# Patient Record
Sex: Female | Born: 1970 | Race: White | Hispanic: No | Marital: Married | Smoking: Former smoker
Health system: Southern US, Community
[De-identification: ages and names within clinical notes are randomized; demographics above are authoritative.]

## PROBLEM LIST (undated history)

## (undated) DIAGNOSIS — K432 Incisional hernia without obstruction or gangrene: Secondary | ICD-10-CM

## (undated) DIAGNOSIS — J40 Bronchitis, not specified as acute or chronic: Secondary | ICD-10-CM

## (undated) DIAGNOSIS — L439 Lichen planus, unspecified: Secondary | ICD-10-CM

## (undated) DIAGNOSIS — M7732 Calcaneal spur, left foot: Secondary | ICD-10-CM

## (undated) DIAGNOSIS — E119 Type 2 diabetes mellitus without complications: Secondary | ICD-10-CM

## (undated) DIAGNOSIS — G4733 Obstructive sleep apnea (adult) (pediatric): Secondary | ICD-10-CM

## (undated) DIAGNOSIS — F419 Anxiety disorder, unspecified: Secondary | ICD-10-CM

## (undated) DIAGNOSIS — J189 Pneumonia, unspecified organism: Secondary | ICD-10-CM

## (undated) HISTORY — DX: Obstructive sleep apnea (adult) (pediatric): G47.33

## (undated) HISTORY — PX: OTHER SURGICAL HISTORY: SHX169

---

## 1998-10-05 ENCOUNTER — Emergency Department (HOSPITAL_COMMUNITY): Admission: EM | Admit: 1998-10-05 | Discharge: 1998-10-05 | Payer: Self-pay | Admitting: Emergency Medicine

## 2003-05-22 ENCOUNTER — Emergency Department (HOSPITAL_COMMUNITY): Admission: EM | Admit: 2003-05-22 | Discharge: 2003-05-23 | Payer: Self-pay | Admitting: *Deleted

## 2003-05-23 ENCOUNTER — Encounter: Payer: Self-pay | Admitting: *Deleted

## 2005-02-13 ENCOUNTER — Emergency Department (HOSPITAL_COMMUNITY): Admission: EM | Admit: 2005-02-13 | Discharge: 2005-02-13 | Payer: Self-pay | Admitting: Emergency Medicine

## 2005-02-13 IMAGING — CR DG CHEST 2V
2 series · 2 of 2 positions shown · non-contrast
Comparison: none

CLINICAL DATA: Cough.  Fever.  Shortness of breath.  
 CHEST - 2 VIEWS:

[view not recorded (1 of 2)]
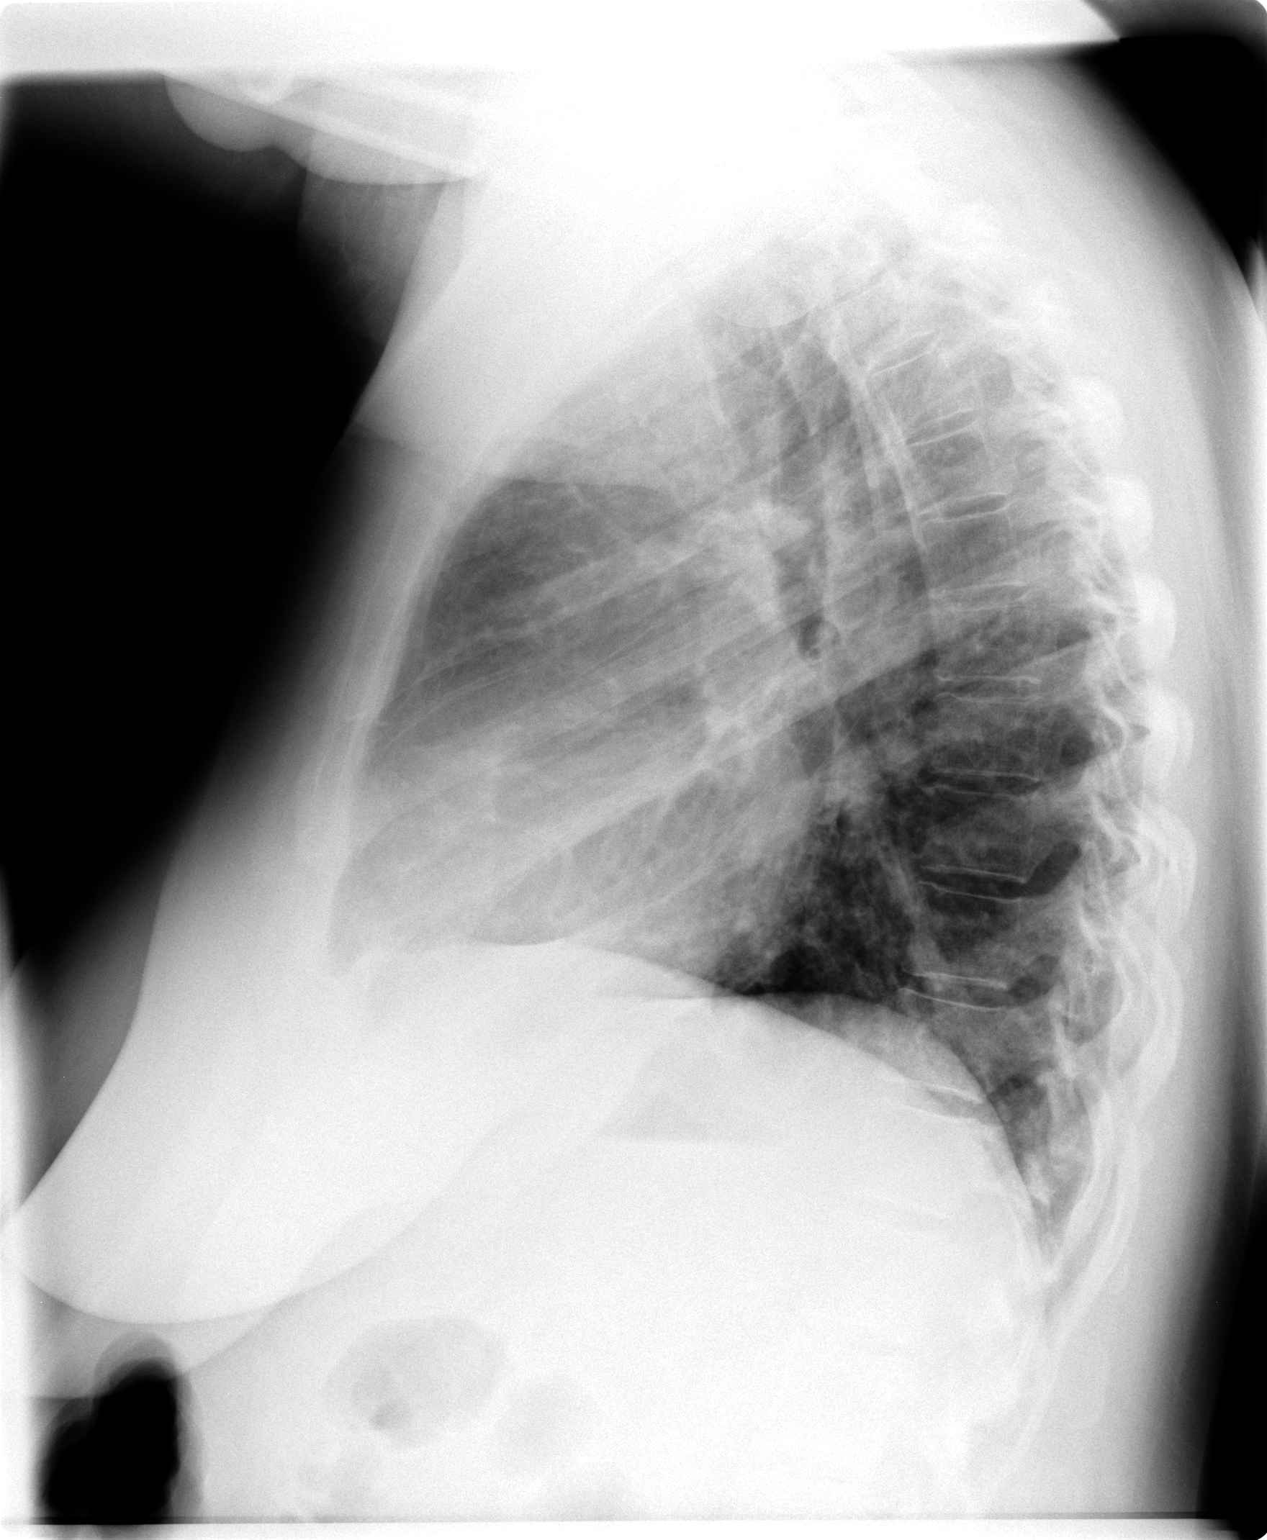

[view not recorded (2 of 2)]
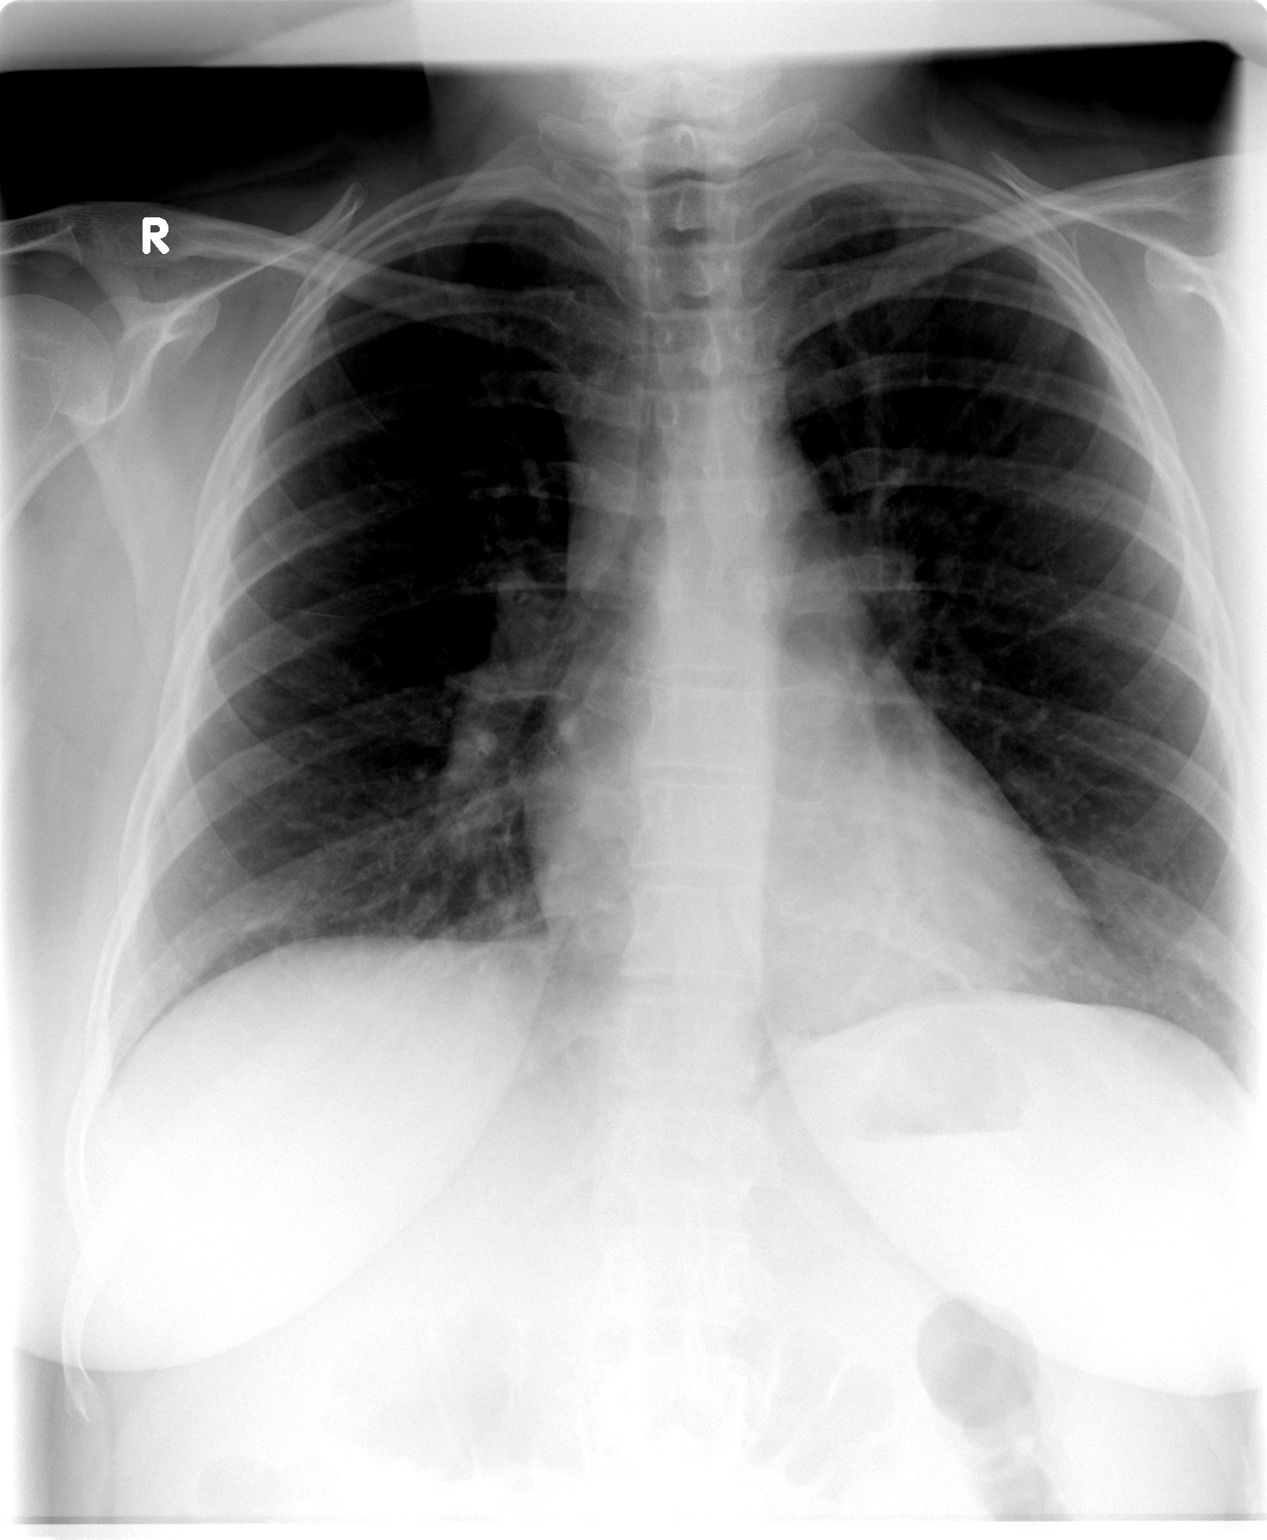

[2 of 2 positions shown; findings below may reference images not displayed]

FINDINGS: No acute consolidation, pneumonia, effusion or pneumothorax.  Heart size is normal.
IMPRESSION: No acute chest disease.

## 2008-12-27 ENCOUNTER — Emergency Department (HOSPITAL_COMMUNITY): Admission: EM | Admit: 2008-12-27 | Discharge: 2008-12-27 | Payer: Self-pay | Admitting: Emergency Medicine

## 2011-03-29 LAB — URINALYSIS, ROUTINE W REFLEX MICROSCOPIC
Glucose, UA: NEGATIVE mg/dL
Hgb urine dipstick: NEGATIVE
Ketones, ur: NEGATIVE mg/dL
Protein, ur: NEGATIVE mg/dL
pH: 7 (ref 5.0–8.0)

## 2012-07-01 ENCOUNTER — Emergency Department (HOSPITAL_COMMUNITY)
Admission: EM | Admit: 2012-07-01 | Discharge: 2012-07-01 | Disposition: A | Discharge status: Home / Self Care | Payer: Self-pay | Attending: Emergency Medicine | Admitting: Emergency Medicine

## 2012-07-01 ENCOUNTER — Encounter (HOSPITAL_COMMUNITY): Payer: Self-pay | Admitting: *Deleted

## 2012-07-01 DIAGNOSIS — J069 Acute upper respiratory infection, unspecified: Secondary | ICD-10-CM

## 2012-07-01 DIAGNOSIS — J029 Acute pharyngitis, unspecified: Secondary | ICD-10-CM

## 2012-07-01 DIAGNOSIS — R070 Pain in throat: Secondary | ICD-10-CM | POA: Insufficient documentation

## 2012-07-01 DIAGNOSIS — H9209 Otalgia, unspecified ear: Secondary | ICD-10-CM | POA: Insufficient documentation

## 2012-07-01 DIAGNOSIS — Z87891 Personal history of nicotine dependence: Secondary | ICD-10-CM | POA: Insufficient documentation

## 2012-07-01 HISTORY — DX: Bronchitis, not specified as acute or chronic: J40

## 2012-07-01 LAB — RAPID STREP SCREEN (MED CTR MEBANE ONLY): Streptococcus, Group A Screen (Direct): NEGATIVE

## 2012-07-01 MED ORDER — HYDROCODONE-ACETAMINOPHEN 5-325 MG PO TABS: ORAL_TABLET | ORAL | Status: AC

## 2012-07-01 MED ORDER — NAPROXEN 250 MG PO TABS: 250.0000 mg | ORAL_TABLET | Freq: Two times a day (BID) | ORAL | Status: DC

## 2012-07-01 MED ORDER — IBUPROFEN 400 MG PO TABS
400.0000 mg | ORAL_TABLET | Freq: Once | ORAL | Status: AC
  Administered 2012-07-01: 400 mg via ORAL
  Filled 2012-07-01: qty 1

## 2012-07-01 MED ORDER — DEXAMETHASONE 4 MG PO TABS
4.0000 mg | ORAL_TABLET | Freq: Once | ORAL | Status: AC
  Administered 2012-07-01: 4 mg via ORAL
  Filled 2012-07-01: qty 1

## 2012-07-01 NOTE — ED Notes (Signed)
Rt ear pain, throat scratchy/aches x 1 day

## 2012-07-01 NOTE — ED Provider Notes (Signed)
History     CSN: [id number]  Arrival date & time 07/01/12  0610   First MD Initiated Contact with Patient 07/01/12 [phone]      Chief Complaint  Patient presents with  . Sore Throat  . Otalgia    HPI Pt was seen at 0725.  Per pt, c/o gradual onset and persistence of constant sore throat, stuffy nose, sinus congestion, and right ear pain for the past 1 day.  Denies fevers, no rash, no CP/SOB, no N/V/D, no abd pain.    Past Medical History  Diagnosis Date  . Asthma   . Bronchitis     History reviewed. No pertinent past surgical history.    History  Substance Use Topics  . Smoking status: Former Games developer  . Smokeless tobacco: Not on file  . Alcohol Use: No    Review of Systems ROS: Statement: All systems negative except as marked or noted in the HPI; Constitutional: Negative for fever and chills. ; ; Eyes: Negative for eye pain, redness and discharge. ; ; ENMT: Negative for hoarseness, +ear pain, nasal congestion, sinus pressure and sore throat. ; ; Cardiovascular: Negative for chest pain, palpitations, diaphoresis, dyspnea and peripheral edema. ; ; Respiratory: Negative for cough, wheezing and stridor. ; ; Gastrointestinal: Negative for nausea, vomiting, diarrhea, abdominal pain, blood in stool, hematemesis, jaundice and rectal bleeding. . ; ; Genitourinary: Negative for dysuria, flank pain and hematuria. ; ; Musculoskeletal: Negative for back pain and neck pain. Negative for swelling and trauma.; ; Skin: Negative for pruritus, rash, abrasions, blisters, bruising and skin lesion.; ; Neuro: Negative for headache, lightheadedness and neck stiffness. Negative for weakness, altered level of consciousness , altered mental status, extremity weakness, paresthesias, involuntary movement, seizure and syncope.       Allergies  Penicillins  Home Medications  No current outpatient prescriptions on file.  BP 137/82  Pulse 78  Temp 98 F (36.7 C) (Oral)  Resp 18  Ht 5\' 2"  (1.575 m)   Wt 203 lb (92.08 kg)  BMI 37.13 kg/m2  SpO2 96%  LMP 06/15/2012  Physical Exam 0730: Physical examination:  Nursing notes reviewed; Vital signs and O2 SAT reviewed;  Constitutional: Well developed, Well nourished, Well hydrated, In no acute distress; Head:  Normocephalic, atraumatic; Eyes: EOMI, PERRL, No scleral icterus; ENMT: TM's clear bilat, +edemetous nasal turbinates bilat with clear rhinorrhea. +post pharynx with erythema, no exudates, no intra-oral edema, no hoarse voice, no drooling, no stridor. Mouth and pharynx normal, Mucous membranes moist; Neck: Supple, Full range of motion, No lymphadenopathy; Cardiovascular: Regular rate and rhythm, No murmur, rub, or gallop; Respiratory: Breath sounds clear & equal bilaterally, No rales, rhonchi, wheezes.  Speaking full sentences with ease, Normal respiratory effort/excursion; Chest: Nontender, Movement normal; Extremities: Pulses normal, No tenderness, No edema, No calf edema or asymmetry.; Neuro: AA&Ox3, Major CN grossly intact.  Speech clear. No gross focal motor or sensory deficits in extremities.; Skin: Color normal, Warm, Dry, no rash.   ED Course  Procedures   MDM  MDM Reviewed: nursing note and vitals Interpretation: labs     Results for orders placed during the hospital encounter of 07/01/12  RAPID STREP SCREEN      Component Value Range   Streptococcus, Group A Screen (Direct) NEGATIVE  NEGATIVE     8:38 AM:  Appears viral URI at this time, will tx symptomatically.  Dx testing d/w pt.  Questions answered.  Verb understanding, agreeable to d/c home with outpt f/u.  Laray Anger, DO 07/03/12 [phone]

## 2012-12-19 ENCOUNTER — Emergency Department (HOSPITAL_COMMUNITY): Payer: Self-pay

## 2012-12-19 ENCOUNTER — Encounter (HOSPITAL_COMMUNITY): Payer: Self-pay

## 2012-12-19 ENCOUNTER — Emergency Department (HOSPITAL_COMMUNITY)
Admission: EM | Admit: 2012-12-19 | Discharge: 2012-12-19 | Disposition: A | Discharge status: Home / Self Care | Payer: Self-pay | Attending: Emergency Medicine | Admitting: Emergency Medicine

## 2012-12-19 DIAGNOSIS — R509 Fever, unspecified: Secondary | ICD-10-CM | POA: Insufficient documentation

## 2012-12-19 DIAGNOSIS — Z87891 Personal history of nicotine dependence: Secondary | ICD-10-CM | POA: Insufficient documentation

## 2012-12-19 DIAGNOSIS — R079 Chest pain, unspecified: Secondary | ICD-10-CM | POA: Insufficient documentation

## 2012-12-19 DIAGNOSIS — Z79899 Other long term (current) drug therapy: Secondary | ICD-10-CM | POA: Insufficient documentation

## 2012-12-19 DIAGNOSIS — J45901 Unspecified asthma with (acute) exacerbation: Secondary | ICD-10-CM | POA: Insufficient documentation

## 2012-12-19 DIAGNOSIS — J189 Pneumonia, unspecified organism: Secondary | ICD-10-CM

## 2012-12-19 IMAGING — CR DG CHEST 2V
2 series · 2 of 2 positions shown · non-contrast
Comparison: PA and lateral chest [DATE].

CLINICAL DATA: Left side chest pain.  Cough and fever.

CHEST - 2 VIEW

[view not recorded (1 of 2)]
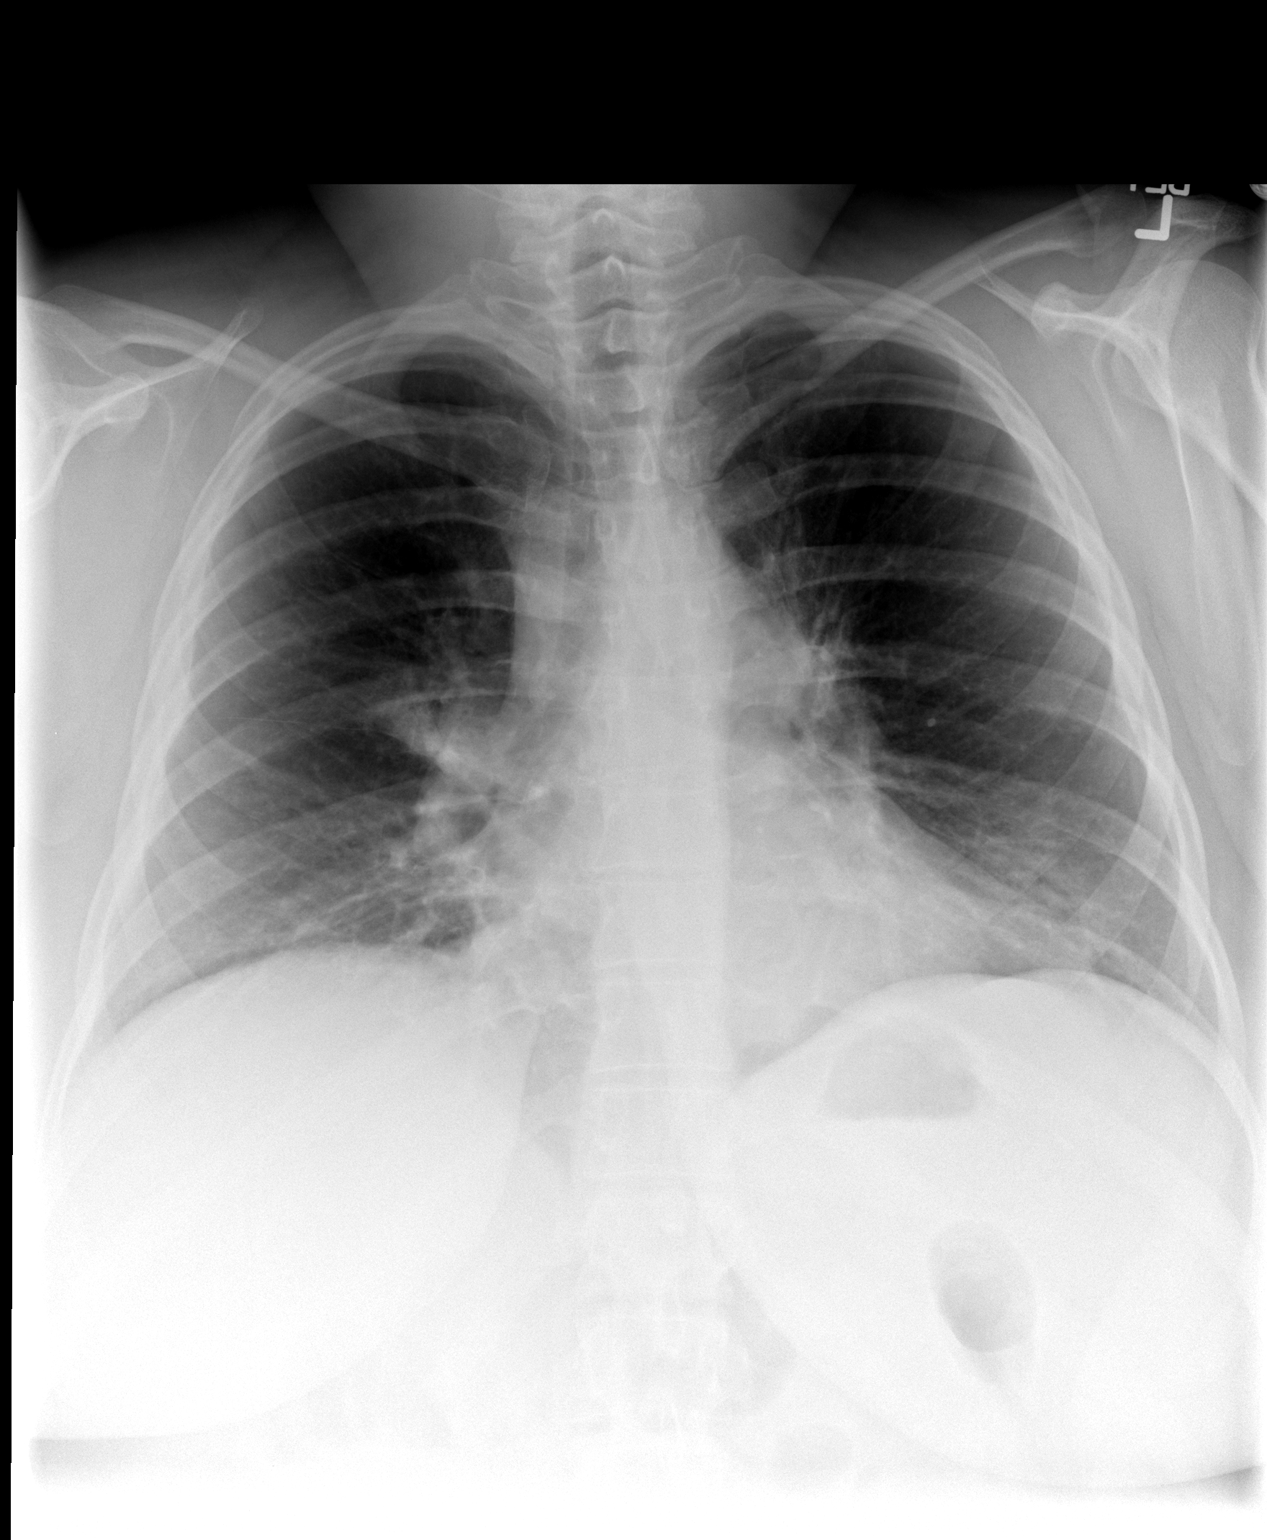

[view not recorded (2 of 2)]
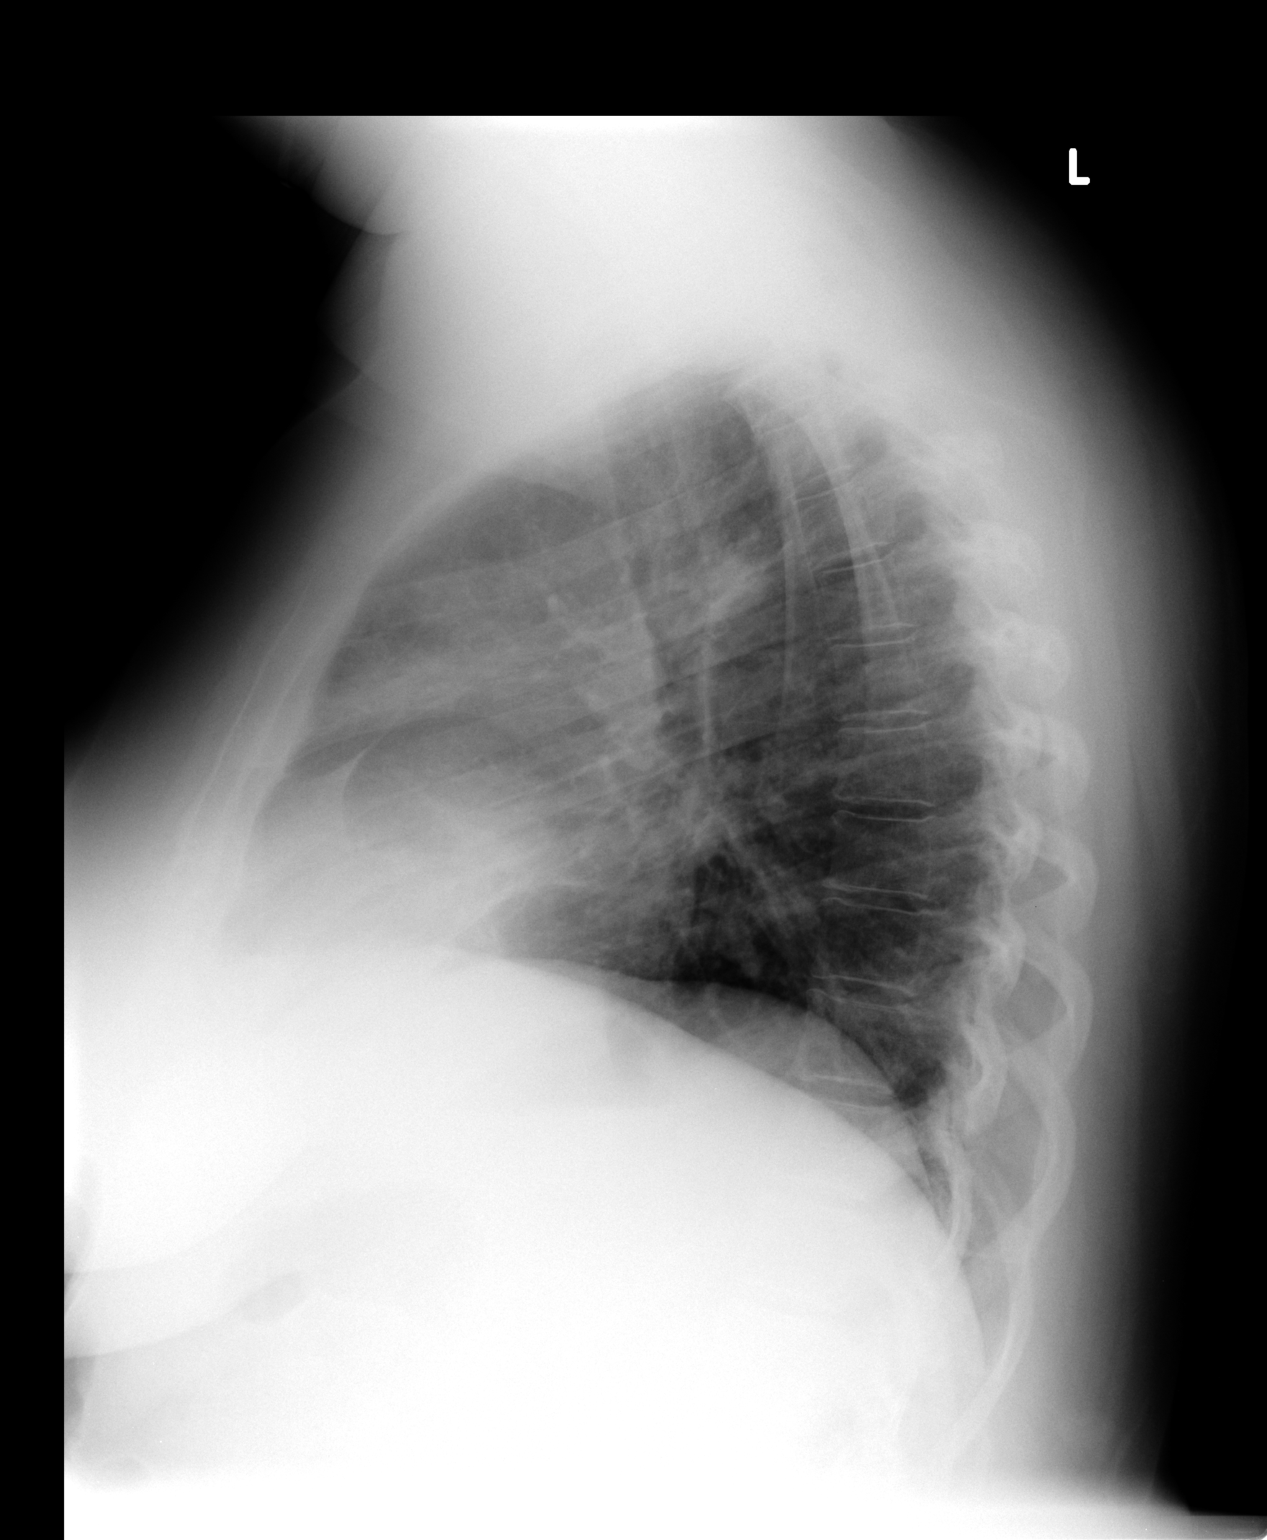

[2 of 2 positions shown; findings below may reference images not displayed]

FINDINGS: There are bilateral perihilar airspace opacities.
Streaky basilar opacities are also noted.  No pneumothorax or
pleural fluid.  Heart size is normal.
IMPRESSION: Perihilar and basilar airspace disease worrisome for pneumonia.

## 2012-12-19 MED ORDER — AZITHROMYCIN 250 MG PO TABS: ORAL_TABLET | ORAL | Status: DC

## 2012-12-19 MED ORDER — AZITHROMYCIN 250 MG PO TABS
500.0000 mg | ORAL_TABLET | Freq: Once | ORAL | Status: AC
  Administered 2012-12-19: 500 mg via ORAL
  Filled 2012-12-19: qty 2

## 2012-12-19 MED ORDER — CEFTRIAXONE SODIUM 1 G IJ SOLR
1.0000 g | Freq: Once | INTRAMUSCULAR | Status: AC
  Administered 2012-12-19: 1 g via INTRAMUSCULAR
  Filled 2012-12-19: qty 10

## 2012-12-19 MED ORDER — GUAIFENESIN-CODEINE 100-10 MG/5ML PO SYRP: ORAL_SOLUTION | ORAL | Status: DC

## 2012-12-19 MED ORDER — HYDROCOD POLST-CHLORPHEN POLST 10-8 MG/5ML PO LQCR
5.0000 mL | Freq: Once | ORAL | Status: AC
  Administered 2012-12-19: 5 mL via ORAL
  Filled 2012-12-19: qty 5

## 2012-12-19 NOTE — ED Provider Notes (Signed)
Medical screening examination/treatment/procedure(s) were performed by non-physician practitioner and as supervising physician I was immediately available for consultation/collaboration.   Celene Kras, MD 12/19/12 [phone]

## 2012-12-19 NOTE — ED Notes (Signed)
Coughing, chest congestion per patient. Was coughing tonight and started hurting in my left side per pt.

## 2012-12-19 NOTE — ED Provider Notes (Signed)
History     CSN: [id number]  Arrival date & time 12/19/12  2143   First MD Initiated Contact with Patient 12/19/12 2150      Chief Complaint  Patient presents with  . Cough  . Shortness of Breath    (Consider location/radiation/quality/duration/timing/severity/associated sxs/prior treatment) HPI Comments: Coughing x 2 days.  + subj fever.  After one forceful cough this PM she  Has had sharp pain under the R breast.  Pt of dr. Babette Relic spears.  The history is provided by the patient. No language interpreter was used.    Past Medical History  Diagnosis Date  . Asthma   . Bronchitis     History reviewed. No pertinent past surgical history.  No family history on file.  History  Substance Use Topics  . Smoking status: Former Games developer  . Smokeless tobacco: Not on file  . Alcohol Use: No    OB History    Grav Para Term Preterm Abortions TAB SAB Ect Mult Living                  Review of Systems  Constitutional: Positive for fever. Negative for chills.  Respiratory: Positive for cough and shortness of breath.   Cardiovascular: Positive for chest pain.  All other systems reviewed and are negative.    Allergies  Penicillins  Home Medications   Current Outpatient Rx  Name  Route  Sig  Dispense  Refill  . AZITHROMYCIN 250 MG PO TABS      One tab po QD (intial dose given in ED)   4 tablet   0   . GUAIFENESIN-CODEINE 100-10 MG/5ML PO SYRP      10 mls po q 4-6 hrs prn cough   240 mL   0   . NAPROXEN 250 MG PO TABS   Oral   Take 1 tablet (250 mg total) by mouth 2 (two) times daily with a meal.   14 tablet   0     BP 129/84  Pulse 88  Temp 98.2 F (36.8 C) (Oral)  Resp 18  Ht 5\' 1"  (1.549 m)  Wt 260 lb (117.935 kg)  BMI 49.13 kg/m2  SpO2 100%  LMP 12/14/2012  Physical Exam  Nursing note and vitals reviewed. Constitutional: She is oriented to person, place, and time. She appears well-developed and well-nourished. No distress.  HENT:  Head:  Normocephalic and atraumatic.  Eyes: EOM are normal.  Neck: Normal range of motion.  Cardiovascular: Normal rate and regular rhythm.   Pulmonary/Chest: Effort normal and breath sounds normal. No respiratory distress. She has no wheezes. She has no rales. She exhibits no tenderness.    Abdominal: Soft. She exhibits no distension. There is no tenderness.  Musculoskeletal: Normal range of motion. She exhibits tenderness.  Neurological: She is alert and oriented to person, place, and time.  Skin: Skin is warm and dry. She is not diaphoretic.  Psychiatric: She has a normal mood and affect. Judgment normal.    ED Course  Procedures (including critical care time)  Labs Reviewed - No data to display Dg Chest 2 View  12/19/2012  *RADIOLOGY REPORT*  Clinical Data: Left side chest pain.  Cough and fever.  CHEST - 2 VIEW  Comparison: PA and lateral chest 02/13/2005.  Findings: There are bilateral perihilar airspace opacities. Streaky basilar opacities are also noted.  No pneumothorax or pleural fluid.  Heart size is normal.  IMPRESSION: Perihilar and basilar airspace disease worrisome for pneumonia.   Original  Report Authenticated By: Holley Dexter, M.D.      1. Community acquired pneumonia       MDM  Rocephin 1 gm IM zithromax 500 mg po rx-zithromax 250 mg x 4 days rx-robitussin AC, 240 ml        Evalina Field, [place] 12/19/12 2313

## 2012-12-19 NOTE — ED Notes (Signed)
Upon returning from xray pt states cannot breath, and feels SOB. No difficulty speaking in full sentences at this time. No acute distress noted. EDP notified.

## 2012-12-19 NOTE — ED Notes (Signed)
Pt presents with c/o SOB, cough and generalized weakness x 2 days. Pt denies sore throat, fever, n/v/d and chest pain. Pt reports taking OTC cold medication without relief. NAD noted vitals WNL .

## 2012-12-19 NOTE — ED Notes (Signed)
Advised patient to wait 15 minutes after medication administration before departing ED. Patient verbalized understanding. Patient currently sitting up in bed. No obvious distress noted.

## 2013-01-04 ENCOUNTER — Encounter (HOSPITAL_COMMUNITY): Payer: Self-pay | Admitting: Emergency Medicine

## 2013-01-04 ENCOUNTER — Emergency Department (HOSPITAL_COMMUNITY)
Admission: EM | Admit: 2013-01-04 | Discharge: 2013-01-05 | Disposition: A | Discharge status: Home / Self Care | Payer: Self-pay | Attending: Emergency Medicine | Admitting: Emergency Medicine

## 2013-01-04 ENCOUNTER — Emergency Department (HOSPITAL_COMMUNITY): Payer: Self-pay

## 2013-01-04 DIAGNOSIS — R109 Unspecified abdominal pain: Secondary | ICD-10-CM | POA: Insufficient documentation

## 2013-01-04 DIAGNOSIS — Z79899 Other long term (current) drug therapy: Secondary | ICD-10-CM | POA: Insufficient documentation

## 2013-01-04 DIAGNOSIS — J159 Unspecified bacterial pneumonia: Secondary | ICD-10-CM | POA: Insufficient documentation

## 2013-01-04 DIAGNOSIS — J189 Pneumonia, unspecified organism: Secondary | ICD-10-CM

## 2013-01-04 DIAGNOSIS — J45909 Unspecified asthma, uncomplicated: Secondary | ICD-10-CM | POA: Insufficient documentation

## 2013-01-04 DIAGNOSIS — Z8709 Personal history of other diseases of the respiratory system: Secondary | ICD-10-CM | POA: Insufficient documentation

## 2013-01-04 DIAGNOSIS — Z87891 Personal history of nicotine dependence: Secondary | ICD-10-CM | POA: Insufficient documentation

## 2013-01-04 HISTORY — DX: Pneumonia, unspecified organism: J18.9

## 2013-01-04 IMAGING — CR DG CHEST 2V
2 series · 2 of 2 positions shown · non-contrast
Comparison: Chest radiograph performed [DATE]

CLINICAL DATA: Chest pain and bilateral flank pain; history of
asthma and smoking..

CHEST - 2 VIEW

[view not recorded (1 of 2)]
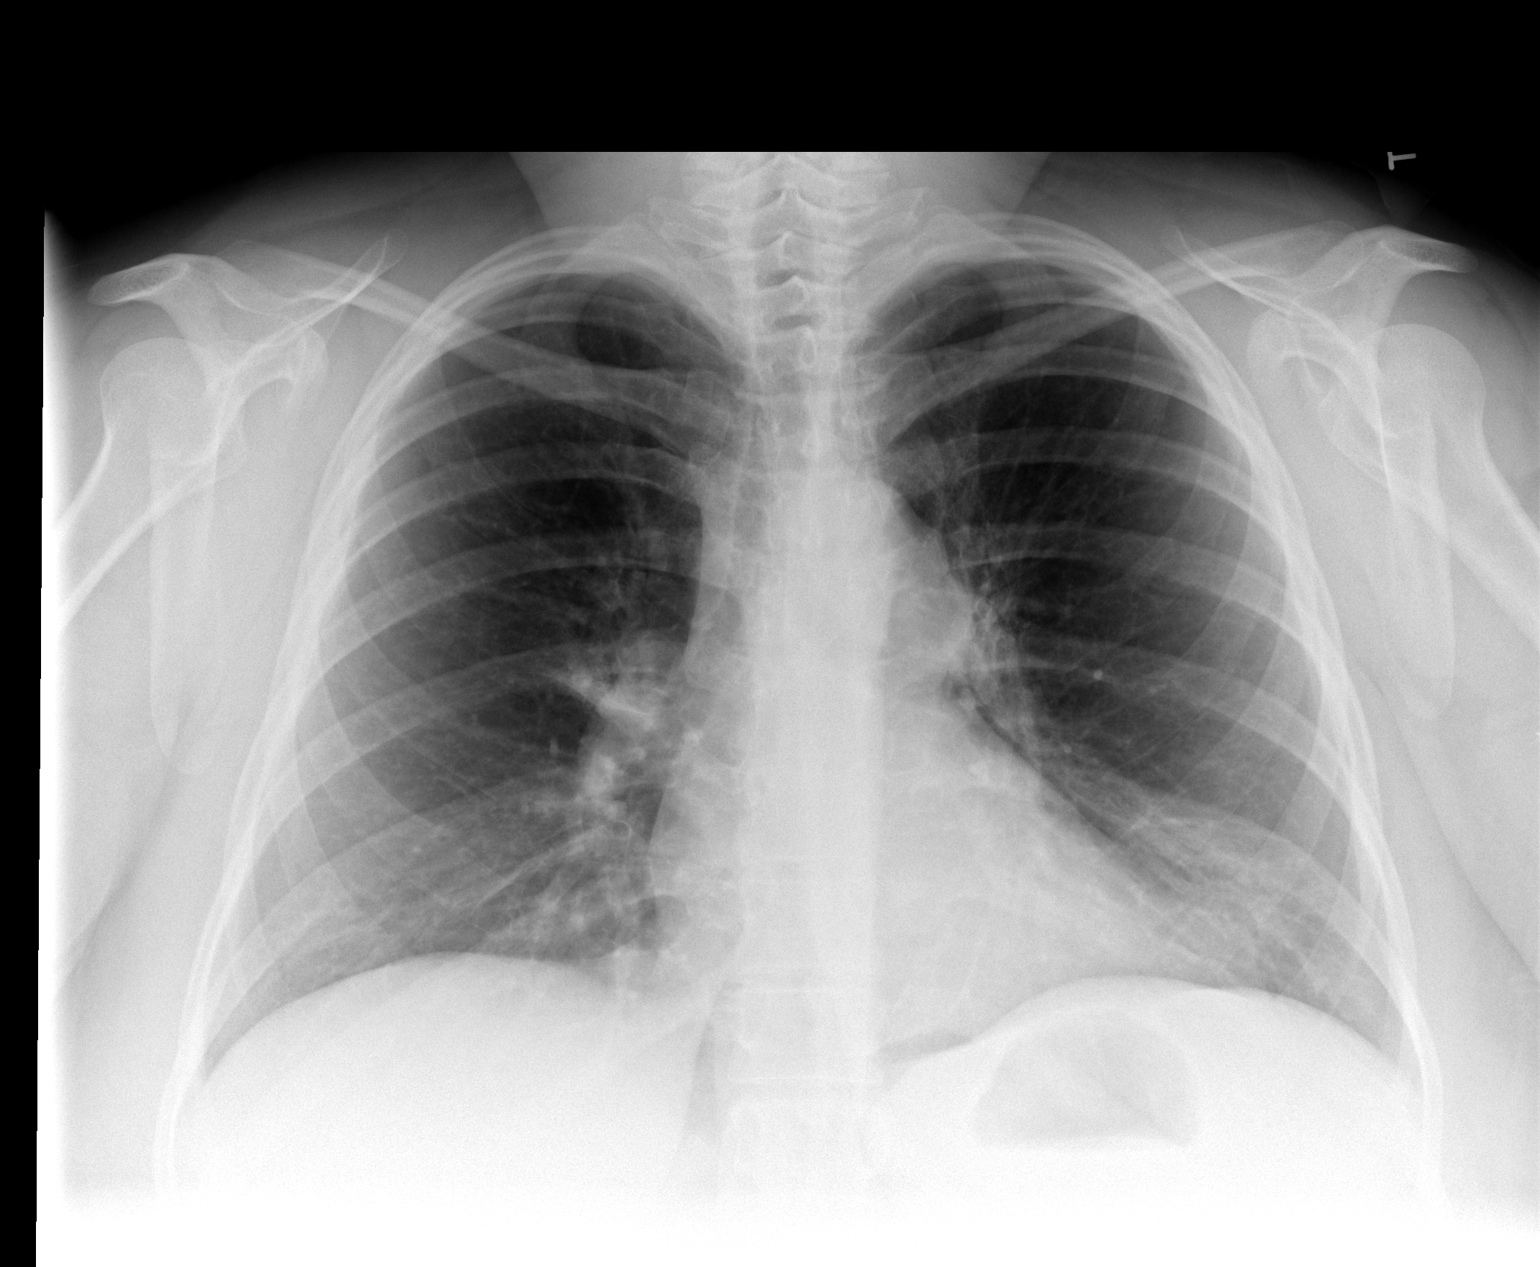

[view not recorded (2 of 2)]
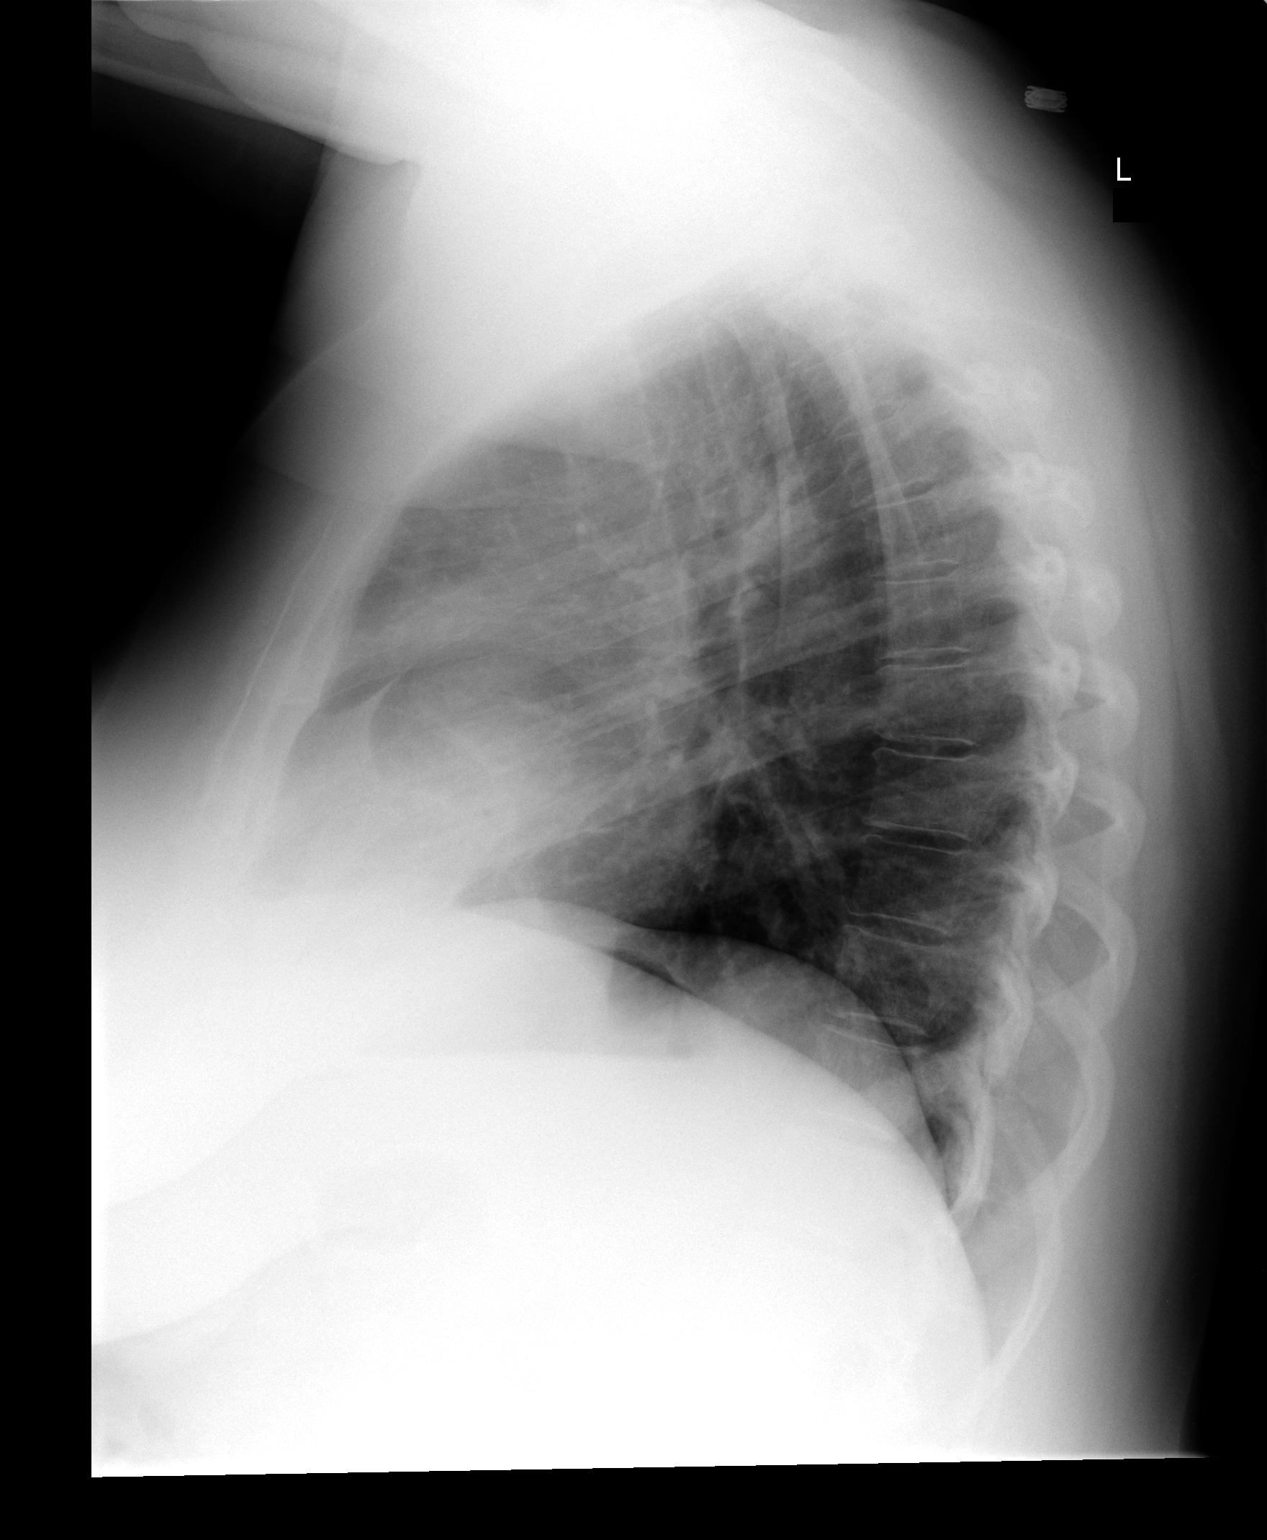

[2 of 2 positions shown; findings below may reference images not displayed]

FINDINGS: There is persistent lingular airspace opacification,
concerning for persistent pneumonia.  No pleural effusion or
pneumothorax is seen.  The lungs are relatively well expanded.

The heart is normal in size.  No acute osseous abnormalities are
identified.
IMPRESSION: Persistent lingular pneumonia noted.

## 2013-01-04 MED ORDER — IBUPROFEN 800 MG PO TABS
800.0000 mg | ORAL_TABLET | Freq: Once | ORAL | Status: AC
  Administered 2013-01-04: 800 mg via ORAL
  Filled 2013-01-04: qty 1

## 2013-01-04 MED ORDER — HYDROCODONE-ACETAMINOPHEN 5-325 MG PO TABS
1.0000 | ORAL_TABLET | Freq: Once | ORAL | Status: AC
  Administered 2013-01-04: 1 via ORAL
  Filled 2013-01-04: qty 1

## 2013-01-04 MED ORDER — OXYMETAZOLINE HCL 0.05 % NA SOLN
1.0000 | Freq: Once | NASAL | Status: AC
  Administered 2013-01-04: 1 via NASAL
  Filled 2013-01-04: qty 15

## 2013-01-04 NOTE — ED Notes (Signed)
Pt given water to drink per nurse

## 2013-01-04 NOTE — ED Notes (Signed)
Pt c/o bilateral intermittent flank pain. Pt was recently diagnosed with bilateral pneumonia.

## 2013-01-04 NOTE — ED Provider Notes (Signed)
History   This chart was scribed for [employer]. Colon Branch, MD by Sofie Rower, ED Scribe. The patient was seen in room [facility] and the patient's care was started at 11:09PM.     CSN: [id number]  Arrival date & time 01/04/13  2156   First MD Initiated Contact with Patient 01/04/13 2309      Chief Complaint  Patient presents with  . Flank Pain    (Consider location/radiation/quality/duration/timing/severity/associated sxs/prior treatment) The history is provided by the patient. No language interpreter was used.    Denise Barnett is a 42 y.o. female , with a hx of flank pain, bilateral pneumonia and asthma, who presents to the Emergency Department complaining of  progressively worsening, abdominal pain located at the bilateral, lateral abdominal regions.  The pt reports she has been experiencing a constant bilateral, lateral abdominal pain throughout the day today, however, her abdominal pain has suddenly become worse upon arrival and evaluation at [facility] this evening. The pt has not taken any medications to reliever her abdominal pain at present. Modifying factors include certain movements and positions which intensifies the abdominal pain.  The pt denies vomiting, hematochezia, and fever.   The pt does not smoke or drink alcohol.      Past Medical History  Diagnosis Date  . Asthma   . Bronchitis   . Pneumonia     History reviewed. No pertinent past surgical history.  History reviewed. No pertinent family history.  History  Substance Use Topics  . Smoking status: Former Games developer  . Smokeless tobacco: Not on file  . Alcohol Use: No    OB History    Grav Para Term Preterm Abortions TAB SAB Ect Mult Living                  Review of Systems  Constitutional: Negative for fever.  HENT: Positive for congestion.   Gastrointestinal: Positive for abdominal pain. Negative for vomiting and blood in stool.  All other systems reviewed and are negative.  A 10 review of systems  reviewed and are negative for acute change except as noted in the HPI.  Allergies  Penicillins  Home Medications   Current Outpatient Rx  Name  Route  Sig  Dispense  Refill  . ALBUTEROL SULFATE HFA 108 (90 BASE) MCG/ACT IN AERS   Inhalation   Inhale 2 puffs into the lungs every 6 (six) hours as needed. For shortness of breath         . IBUPROFEN 200 MG PO TABS   Oral   Take 400 mg by mouth daily as needed. For pain         . OXYMETAZOLINE HCL 0.05 % NA SOLN   Nasal   Place 2 sprays into the nose daily as needed. For nasal congestion           BP 133/79  Pulse 90  Temp 97.3 F (36.3 C) (Oral)  Resp 22  Ht 5\' 1"  (1.549 m)  Wt 230 lb (104.327 kg)  BMI 43.46 kg/m2  SpO2 100%  LMP 12/14/2012  Physical Exam  Nursing note and vitals reviewed. Constitutional: She is oriented to person, place, and time. She appears well-developed and well-nourished. No distress.  HENT:  Head: Normocephalic and atraumatic.  Eyes: EOM are normal. Pupils are equal, round, and reactive to light.  Neck: Neck supple. No tracheal deviation present.  Cardiovascular: Normal rate, regular rhythm and normal heart sounds.   Pulmonary/Chest: Effort normal and breath sounds normal. No respiratory  distress. She has no wheezes.  Abdominal: Soft. Bowel sounds are normal. She exhibits no distension.       Bilateral, lateral abdominal pain upon exam.   Musculoskeletal: Normal range of motion. She exhibits no edema.  Neurological: She is alert and oriented to person, place, and time. No sensory deficit.  Skin: Skin is warm and dry.  Psychiatric: She has a normal mood and affect. Her behavior is normal.    ED Course  Procedures (including critical care time)  Results for orders placed during the hospital encounter of 01/04/13  CBC WITH DIFFERENTIAL      Component Value Range   WBC 13.3 (*) 4.0 - 10.5 K/uL   RBC 4.34  3.87 - 5.11 MIL/uL   Hemoglobin 12.3  12.0 - 15.0 g/dL   HCT 16.1 (*) 09.6 - 04.5  %   MCV 80.4  78.0 - 100.0 fL   MCH 28.3  26.0 - 34.0 pg   MCHC 35.2  30.0 - 36.0 g/dL   RDW 40.9  81.1 - 91.4 %   Platelets 315  150 - 400 K/uL   Neutrophils Relative 68  43 - 77 %   Neutro Abs 9.0 (*) 1.7 - 7.7 K/uL   Lymphocytes Relative 18  12 - 46 %   Lymphs Abs 2.4  0.7 - 4.0 K/uL   Monocytes Relative 7  3 - 12 %   Monocytes Absolute 1.0  0.1 - 1.0 K/uL   Eosinophils Relative 7 (*) 0 - 5 %   Eosinophils Absolute 0.9 (*) 0.0 - 0.7 K/uL   Basophils Relative 0  0 - 1 %   Basophils Absolute 0.1  0.0 - 0.1 K/uL  BASIC METABOLIC PANEL      Component Value Range   Sodium 130 (*) 135 - 145 mEq/L   Potassium 3.6  3.5 - 5.1 mEq/L   Chloride 96  96 - 112 mEq/L   CO2 22  19 - 32 mEq/L   Glucose, Bld 161 (*) 70 - 99 mg/dL   BUN 11  6 - 23 mg/dL   Creatinine, Ser 7.82  0.50 - 1.10 mg/dL   Calcium 9.3  8.4 - 95.6 mg/dL   GFR calc non Af Amer >90  >90 mL/min   GFR calc Af Amer >90  >90 mL/min  URINALYSIS, ROUTINE W REFLEX MICROSCOPIC      Component Value Range   Color, Urine YELLOW  YELLOW   APPearance HAZY (*) CLEAR   Specific Gravity, Urine 1.020  1.005 - 1.030   pH 5.5  5.0 - 8.0   Glucose, UA NEGATIVE  NEGATIVE mg/dL   Hgb urine dipstick SMALL (*) NEGATIVE   Bilirubin Urine NEGATIVE  NEGATIVE   Ketones, ur NEGATIVE  NEGATIVE mg/dL   Protein, ur NEGATIVE  NEGATIVE mg/dL   Urobilinogen, UA 0.2  0.0 - 1.0 mg/dL   Nitrite NEGATIVE  NEGATIVE   Leukocytes, UA LARGE (*) NEGATIVE  URINE MICROSCOPIC-ADD ON      Component Value Range   Squamous Epithelial / LPF MANY (*) RARE   WBC, UA 11-20  <3 WBC/hpf   RBC / HPF 3-6  <3 RBC/hpf   Bacteria, UA MANY (*) RARE   Dg Chest 2 View  01/04/2013  *RADIOLOGY REPORT*  Clinical Data: Chest pain and bilateral flank pain; history of asthma and smoking.  CHEST - 2 VIEW  Comparison: Chest radiograph performed 12/19/2012  Findings: There is persistent lingular airspace opacification, concerning for persistent pneumonia.  No pleural  effusion or  pneumothorax is seen.  The lungs are relatively well expanded.  The heart is normal in size.  No acute osseous abnormalities are identified.  IMPRESSION: Persistent lingular pneumonia noted.   Original Report Authenticated By: Tonia Ghent, M.D.    DIAGNOSTIC STUDIES: Oxygen Saturation is 100% on room air, normal by my interpretation.    COORDINATION OF CARE:  11:26 PM- Treatment plan discussed with patient. Pt agrees with treatment.     MDM  Patient presents with bilateral abdominal pain. She has recently been treated for CAP with Rocephin and a Zpak. Denies fever, chills, urinary symptoms. Labs with UA indicative of UTI. Chest xray with residual pneumonia. Initiated Zpak. Given analgesic with improvement. Reviewed results with the patient.  Pt feels improved after observation and/or treatment in ED.Pt stable in ED with no significant deterioration in condition.The patient appears reasonably screened and/or stabilized for discharge and I doubt any other medical condition or other [facility] requiring further screening, evaluation, or treatment in the ED at this time prior to discharge.  I personally performed the services described in this documentation, which was scribed in my presence. The recorded information has been reviewed and considered.   MDM Reviewed: nursing note, previous chart and vitals Reviewed previous: x-ray Interpretation: x-ray and labs           [employer]. Colon Branch, MD 01/05/13 0865

## 2013-01-05 LAB — CBC WITH DIFFERENTIAL/PLATELET
Basophils Absolute: 0.1 10*3/uL (ref 0.0–0.1)
Basophils Relative: 0 % (ref 0–1)
Eosinophils Absolute: 0.9 10*3/uL — ABNORMAL HIGH (ref 0.0–0.7)
MCH: 28.3 pg (ref 26.0–34.0)
MCHC: 35.2 g/dL (ref 30.0–36.0)
Neutro Abs: 9 10*3/uL — ABNORMAL HIGH (ref 1.7–7.7)
Neutrophils Relative %: 68 % (ref 43–77)
Platelets: 315 10*3/uL (ref 150–400)
RDW: 13.1 % (ref 11.5–15.5)

## 2013-01-05 LAB — URINALYSIS, ROUTINE W REFLEX MICROSCOPIC
Bilirubin Urine: NEGATIVE
Nitrite: NEGATIVE
Specific Gravity, Urine: 1.02 (ref 1.005–1.030)
Urobilinogen, UA: 0.2 mg/dL (ref 0.0–1.0)

## 2013-01-05 LAB — BASIC METABOLIC PANEL
BUN: 11 mg/dL (ref 6–23)
Chloride: 96 mEq/L (ref 96–112)
GFR calc Af Amer: >90 mL/min (ref >90)
GFR calc non Af Amer: >90 mL/min (ref >90)
Potassium: 3.6 mEq/L (ref 3.5–5.1)
Sodium: 130 mEq/L — ABNORMAL LOW (ref 135–145)

## 2013-01-05 LAB — URINE MICROSCOPIC-ADD ON

## 2013-01-05 MED ORDER — HYDROCODONE-ACETAMINOPHEN 5-325 MG PO TABS: 1.0000 | ORAL_TABLET | ORAL | Status: AC | PRN

## 2013-01-05 MED ORDER — AZITHROMYCIN 250 MG PO TABS: ORAL_TABLET | ORAL | Status: DC

## 2013-01-05 NOTE — ED Notes (Signed)
Pt alert & oriented x4, stable gait. Patient given discharge instructions, paperwork & prescription(s). Patient  instructed to stop at the registration desk to finish any additional paperwork. Patient verbalized understanding. Pt left department w/ no further questions. 

## 2013-01-06 LAB — URINE CULTURE

## 2013-10-20 ENCOUNTER — Encounter (HOSPITAL_COMMUNITY): Payer: Self-pay | Admitting: Emergency Medicine

## 2013-10-20 ENCOUNTER — Emergency Department (HOSPITAL_COMMUNITY)
Admission: EM | Admit: 2013-10-20 | Discharge: 2013-10-20 | Disposition: A | Discharge status: Home / Self Care | Payer: 59 | Attending: Emergency Medicine | Admitting: Emergency Medicine

## 2013-10-20 DIAGNOSIS — Z87891 Personal history of nicotine dependence: Secondary | ICD-10-CM | POA: Insufficient documentation

## 2013-10-20 DIAGNOSIS — Z8701 Personal history of pneumonia (recurrent): Secondary | ICD-10-CM | POA: Insufficient documentation

## 2013-10-20 DIAGNOSIS — J029 Acute pharyngitis, unspecified: Secondary | ICD-10-CM | POA: Insufficient documentation

## 2013-10-20 DIAGNOSIS — Z792 Long term (current) use of antibiotics: Secondary | ICD-10-CM | POA: Insufficient documentation

## 2013-10-20 DIAGNOSIS — Z880 Allergy status to penicillin: Secondary | ICD-10-CM | POA: Insufficient documentation

## 2013-10-20 DIAGNOSIS — J45909 Unspecified asthma, uncomplicated: Secondary | ICD-10-CM | POA: Insufficient documentation

## 2013-10-20 DIAGNOSIS — H109 Unspecified conjunctivitis: Secondary | ICD-10-CM | POA: Insufficient documentation

## 2013-10-20 MED ORDER — TETRACAINE HCL 0.5 % OP SOLN
1.0000 [drp] | Freq: Once | OPHTHALMIC | Status: AC
  Administered 2013-10-20: 1 [drp] via OPHTHALMIC
  Filled 2013-10-20: qty 2

## 2013-10-20 MED ORDER — FLUORESCEIN SODIUM 1 MG OP STRP
1.0000 | ORAL_STRIP | Freq: Once | OPHTHALMIC | Status: AC
  Administered 2013-10-20: 1 via OPHTHALMIC
  Filled 2013-10-20: qty 1

## 2013-10-20 MED ORDER — TOBRAMYCIN 0.3 % OP SOLN
2.0000 [drp] | OPHTHALMIC | Status: DC
  Administered 2013-10-20: 2 [drp] via OPHTHALMIC
  Filled 2013-10-20: qty 5

## 2013-10-20 NOTE — ED Notes (Signed)
Redness and swelling to right eye. States it was matted shut this morning. Sore throat also.

## 2013-10-20 NOTE — ED Notes (Signed)
Pt states she woke up with left eye drainage, states it was itching and that she had rubbed it last night, also c/o sore throat for a week and nasal congestion

## 2013-10-20 NOTE — ED Provider Notes (Signed)
Medical screening examination/treatment/procedure(s) were performed by non-physician practitioner and as supervising physician I was immediately available for consultation/collaboration.  EKG Interpretation   None         Shelda Jakes, MD 10/20/13 1714

## 2013-10-20 NOTE — ED Provider Notes (Signed)
CSN: [id number]     Arrival date & time 10/20/13  1318 History   First MD Initiated Contact with Patient 10/20/13 1608     Chief Complaint  Patient presents with  . Eye Problem   (Consider location/radiation/quality/duration/timing/severity/associated sxs/prior Treatment) Patient is a 42 y.o. female presenting with eye problem. The history is provided by the patient.  Eye Problem Location:  R eye Quality:  Burning Severity:  Mild Onset quality:  Gradual Duration:  6 hours Timing:  Constant Progression:  Worsening Chronicity:  New Context: burn   Relieved by:  Closing eye Ineffective treatments:  None tried Associated symptoms: crusting, discharge, itching, redness and swelling   Associated symptoms: no blurred vision, no decreased vision, no double vision, no facial rash, no foreign body sensation, no headaches, no nausea, no photophobia and no vomiting    Denise Barnett is a 42 y.o. female who presents to the ED with right eye redness and itching. She states that she woke this morning and the eye was crusted shut. 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   Past Medical History  Diagnosis Date  . Asthma   . Bronchitis   . Pneumonia    History reviewed. No pertinent past surgical history. No family history on file. History  Substance Use Topics  . Smoking status: Former Games developer  . Smokeless tobacco: Not on file  . Alcohol Use: No   OB History   Grav Para Term Preterm Abortions TAB SAB Ect Mult Living                 Review of Systems  Constitutional: Negative for fever and chills.  HENT: Positive for sore throat. Negative for ear pain and facial swelling.   Eyes: Positive for discharge, redness and itching. Negative for blurred vision, double vision, photophobia and visual disturbance.  Gastrointestinal: Negative for nausea, vomiting and abdominal pain.  Genitourinary: Negative for dysuria and urgency.  Musculoskeletal: Negative for joint  swelling and neck pain.  Skin: Negative for rash.  Allergic/Immunologic: Negative for immunocompromised state.  Neurological: Negative for headaches.  Psychiatric/Behavioral: The patient is not nervous/anxious.     Allergies  Penicillins  Home Medications   Current Outpatient Rx  Name  Route  Sig  Dispense  Refill  . albuterol (PROVENTIL HFA;VENTOLIN HFA) 108 (90 BASE) MCG/ACT inhaler   Inhalation   Inhale 2 puffs into the lungs every 6 (six) hours as needed. For shortness of breath         . azithromycin (ZITHROMAX Z-PAK) 250 MG tablet      2 PO day one then 1 PO daily until finished   6 each   0   . ibuprofen (ADVIL,MOTRIN) 200 MG tablet   Oral   Take 400 mg by mouth daily as needed. For pain         . oxymetazoline (AFRIN) 0.05 % nasal spray   Nasal   Place 2 sprays into the nose daily as needed. For nasal congestion          BP 140/58  Pulse 87  Temp(Src) 98.1 F (36.7 C) (Oral)  Ht 5\' 2"  (1.575 m)  Wt 230 lb (104.327 kg)  BMI 42.06 kg/m2  SpO2 99%  LMP 10/20/2013 Physical Exam  Nursing note and vitals reviewed. Constitutional: She is oriented to person, place, and time. She appears well-developed and well-nourished. No distress.  HENT:  Head: Normocephalic and atraumatic.  Right Ear: Tympanic membrane normal.  Left Ear: Tympanic membrane normal.  Nose: Nose normal.  Mouth/Throat: Uvula is midline, oropharynx is clear and moist and mucous membranes are normal.  Eyes: EOM are normal. Pupils are equal, round, and reactive to light. Lids are everted and swept, no foreign bodies found. Right eye exhibits discharge. Right conjunctiva is injected.  Fundoscopic exam:      The right eye shows red reflex.  Slit lamp exam:      The right eye shows no corneal abrasion, no foreign body, no hyphema and no fluorescein uptake.  Neck: Neck supple.  Cardiovascular: Normal rate and regular rhythm.   Pulmonary/Chest: Effort normal and breath sounds normal.   Abdominal: Soft. There is no tenderness.  Musculoskeletal: Normal range of motion.  Lymphadenopathy:    She has no cervical adenopathy.  Neurological: She is alert and oriented to person, place, and time. No cranial nerve deficit.  Skin: Skin is warm and dry.  Psychiatric: She has a normal mood and affect. Her behavior is normal.    ED Course  Procedures  Slip lamp exam MDM  42 y.o. female with conjunctivitis right eye. Will treat with Tobramycin Opth. Drops. First dose instilled here in the ED.  Discussed with the patient clinical findings and plan of care and all questioned fully answered. She will follow up with opthalmology as needed. Stable for discharge without any immediate complications.       Janne Napoleon, [place] 10/20/13 [phone]

## 2013-12-25 ENCOUNTER — Encounter (INDEPENDENT_AMBULATORY_CARE_PROVIDER_SITE_OTHER): Payer: Self-pay

## 2013-12-25 ENCOUNTER — Ambulatory Visit (INDEPENDENT_AMBULATORY_CARE_PROVIDER_SITE_OTHER): Payer: 59 | Admitting: Neurology

## 2013-12-25 ENCOUNTER — Encounter: Payer: Self-pay | Admitting: Neurology

## 2013-12-25 VITALS — BP 122/79 | HR 75 | Ht 64.0 in | Wt 233.0 lb

## 2013-12-25 DIAGNOSIS — Z8249 Family history of ischemic heart disease and other diseases of the circulatory system: Secondary | ICD-10-CM

## 2013-12-25 DIAGNOSIS — L439 Lichen planus, unspecified: Secondary | ICD-10-CM

## 2013-12-25 DIAGNOSIS — G4733 Obstructive sleep apnea (adult) (pediatric): Secondary | ICD-10-CM

## 2013-12-25 DIAGNOSIS — Z836 Family history of other diseases of the respiratory system: Secondary | ICD-10-CM

## 2013-12-25 DIAGNOSIS — E669 Obesity, unspecified: Secondary | ICD-10-CM

## 2013-12-25 HISTORY — DX: Obstructive sleep apnea (adult) (pediatric): G47.33

## 2013-12-25 NOTE — Patient Instructions (Signed)

## 2013-12-25 NOTE — Progress Notes (Signed)
Subjective:    Patient ID: Denise Barnett is a 43 y.o. female.  HPI  Star Age, MD, PhD [facility] [address]O. [place], [place] [address]  Dear [name],   I saw your patient, Denise Barnett, upon your kind request in my neurologic clinic today for initial consultation of her sleep disorder, in particular, concern for obstructive sleep apnea. The patient is unaccompanied today. As you know, [name] is a very pleasant 43 year old right-handed woman with any underlying medical history of seasonal allergies, vertigo, recurrent sinusitis, who reports loud snoring, a family history of sleep apnea and excessive daytime somnolence. She works full time at [employer], usually from 2 PM to 11 PM. She sleeps with 2 pillows and is a side sleeper.   Her typical bedtime is reported to be around 1 or 2 AM and usual wake time is around 9 or 10 AM. Sleep onset typically occurs within a few minutes. She reports feeling marginally rested upon awakening. She wakes up on an average 5 or 6 times in the middle of the night and has to go to the bathroom 1 times on a typical night. She reports rare morning headaches.  She reports excessive daytime somnolence (EDS) and Her Epworth Sleepiness Score (ESS) is 11/24 today. She has not fallen asleep while driving. The patient has not been taking a planned nap, but does fall asleep inadvertently when watching TV.  She has been known to snore for the past few years. Snoring is reportedly marked, and associated with choking sounds and witnessed apneas. The patient admits to a sense of choking or strangling feeling rarely, and sometimes wakes up from her own snoring. There is no report of nighttime reflux, with rare nighttime cough experienced. The patient has not noted any RLS symptoms and is not known to kick while asleep or before falling asleep. There is a strong family history of OSA on her mother's side: mother, MGM  and maternal uncles. She is a restless sleeper and in the morning, the bed is quite disheveled.   She denies cataplexy, sleep paralysis, hypnagogic or hypnopompic hallucinations, or sleep attacks. She does not report any vivid dreams, nightmares, dream enactments, or parasomnias, such as sleep talking or sleep walking. The patient has not had a sleep study or a home sleep test.  She consumes 2 to 3 caffeinated beverages per day, usually in the form of sodas, as late as 11:30 PM.   Her bedroom is usually dark and cool. There is a TV in the bedroom and usually it is not on at night. She quit smoking 2 years ago. She does not drink any alcohol.   Her Past Medical History Is Significant For: Past Medical History  Diagnosis Date  . Asthma   . Bronchitis   . Pneumonia     Her Past Surgical History Is Significant For: Past Surgical History  Procedure Laterality Date  . None      Her Family History Is Significant For: Family History  Problem Relation Age of Onset  . Sleep apnea Mother   . Heart attack Brother     Her Social History Is Significant For: History   Social History  . Marital Status: Single    Spouse Name: N/A    Number of Children: 1  . Years of Education: 12   Occupational History  . [employer]    Social History Main Topics  . Smoking status: Former Research scientist (life sciences)  .  Smokeless tobacco: None  . Alcohol Use: No  . Drug Use: No  . Sexual Activity: Yes   Other Topics Concern  . None   Social History Narrative  . None    Her Allergies Are:  Allergies  Allergen Reactions  . Penicillins Rash  :   Her Current Medications Are:  Outpatient Encounter Prescriptions as of 12/25/2013  Medication Sig  . fluticasone (FLONASE) 50 MCG/ACT nasal spray Place 50 sprays into both nostrils as needed.  . hydroxychloroquine (PLAQUENIL) 200 MG tablet Take 1 tablet by mouth 2 (two) times daily.  [name] ibuprofen (ADVIL,MOTRIN) 200 MG tablet Take 400 mg by mouth daily as needed.  For pain  . lidocaine (XYLOCAINE) 2 % jelly 2 application by Other route as needed.  . montelukast (SINGULAIR) 10 MG tablet Take 10 mg by mouth as needed.  . Norgestimate-Ethinyl Estradiol Triphasic 0.18/0.215/0.25 MG-35 MCG tablet Take 1 tablet by mouth daily.  . [DISCONTINUED] albuterol (PROVENTIL HFA;VENTOLIN HFA) 108 (90 BASE) MCG/ACT inhaler Inhale 2 puffs into the lungs every 6 (six) hours as needed. For shortness of breath  . [DISCONTINUED] HYDROcodone-acetaminophen (NORCO) 7.5-325 MG per tablet Take 1 tablet by mouth every 6 (six) hours as needed. Pain  . [DISCONTINUED] oxymetazoline (AFRIN) 0.05 % nasal spray Place 2 sprays into the nose daily as needed. For nasal congestion  . [DISCONTINUED] predniSONE (DELTASONE) 10 MG tablet Take 50 mg by mouth every other day.   Review of Systems:  Out of a complete 14 point review of systems, all are reviewed and negative with the exception of these symptoms as listed below:   Review of Systems  Respiratory:       Snoring  Musculoskeletal:       Leg cramps  Allergic/Immunologic:       Allergies    Objective:  Neurologic Exam  Physical Exam Physical Examination:   Filed Vitals:   12/25/13 0855  BP: 122/79  Pulse: 75    General Examination: The patient is a very pleasant 43 y.o. female in no acute distress. She appears well-developed and well-nourished and adequately groomed. She is obese.   HEENT: Normocephalic, atraumatic, pupils are equal, round and reactive to light and accommodation. Funduscopic exam is normal with sharp disc margins noted. Extraocular tracking is good without limitation to gaze excursion or nystagmus noted. Normal smooth pursuit is noted. Hearing is grossly intact. Tympanic membranes are clear bilaterally. Face is symmetric with normal facial animation and normal facial sensation. Speech is clear with no dysarthria noted. There is no hypophonia. There is no lip, neck/head, jaw or voice tremor. Neck is supple with  full range of passive and active motion. There are no carotid bruits on auscultation. Oropharynx exam reveals: mild mouth dryness, adequate dental hygiene and mild airway crowding, due to narrow airway entry and elongated tongue. Mallampati is class II. Tongue protrudes centrally and palate elevates symmetrically. Tonsils are 1+, L a little larger than R. Neck size is 17.25 inches. She has a tiny overbite. Nasal inspection reveals no significant nasal mucosal bogginess or redness and no septal deviation.   Chest: Clear to auscultation without wheezing, rhonchi or crackles noted.  Heart: S1+S2+0, regular and normal without murmurs, rubs or gallops noted.   Abdomen: Soft, non-tender and non-distended with normal bowel sounds appreciated on auscultation.  Extremities: There is no pitting edema in the distal lower extremities bilaterally. Pedal pulses are intact.  Skin: Warm and dry without trophic changes noted. There are no varicose veins.  Musculoskeletal: exam  reveals no obvious joint deformities, tenderness or joint swelling or erythema.   Neurologically:  Mental status: The patient is awake, alert and oriented in all 4 spheres. Her memory, attention, language and knowledge are appropriate. There is no aphasia, agnosia, apraxia or anomia. Speech is clear with normal prosody and enunciation. Thought process is linear. Mood is congruent and affect is normal.  Cranial nerves are as described above under HEENT exam. In addition, shoulder shrug is normal with equal shoulder height noted. Motor exam: Normal bulk, strength and tone is noted. There is no drift, tremor or rebound. Romberg is negative. Reflexes are 2+ throughout. Toes are downgoing bilaterally. Fine motor skills are intact with normal finger taps, normal hand movements, normal rapid alternating patting, normal foot taps and normal foot agility.  Cerebellar testing shows no dysmetria or intention tremor on finger to nose testing. Heel to  shin is unremarkable bilaterally. There is no truncal or gait ataxia.  Sensory exam is intact to light touch, pinprick, vibration, temperature sense in the upper and lower extremities.  Gait, station and balance are unremarkable. No veering to one side is noted. No leaning to one side is noted. Posture is age-appropriate and stance is narrow based. No problems turning are noted. She turns en bloc. Tandem walk is unremarkable. Intact toe and heel stance is noted.               Assessment and Plan:   In summary, Tamberly Jerilynn Mages Jolivette is a very pleasant 43 y.o.-year old female with a history and physical exam concerning for obstructive sleep apnea (OSA). I had a long chat with the patient about my findings and the diagnosis, its prognosis and treatment options. We talked about medical treatments and non-pharmacological approaches. I explained in particular the risks and ramifications of untreated moderate to severe OSA, especially with respect to developing cardiovascular disease down the Road, including congestive heart failure, difficult to treat hypertension, cardiac arrhythmias, or stroke. Even type 2 diabetes has in part been linked to untreated OSA. We talked about trying to maintain a healthy lifestyle in general, as well as the importance of weight control and improvement of her sleep hygiene, especially avoiding caffeine later in the evening. I encouraged the patient to eat healthy, exercise daily and keep well hydrated, to keep a scheduled bedtime and wake time routine, to not skip any meals and eat healthy snacks in between meals.  I recommended the following at this time: sleep study with potential positive airway pressure titration.  I explained the sleep test procedure to the patient and also outlined possible surgical and non-surgical treatment options of OSA, including the use of a custom-made dental device, upper airway surgical options, such as pillar implants, radiofrequency surgery, tongue base  surgery, and UPPP. I also explained the CPAP treatment option to the patient, who indicated that she would be willing to try CPAP if the need arises. I explained the importance of being compliant with PAP treatment, not only for insurance purposes but primarily to improve Her symptoms, and for the patient's long term health benefit, including to reduce Her cardiovascular risks. I answered all her questions today and the patient was in agreement. I would like to see her back after the sleep study is completed and encouraged her to call with any interim questions, concerns, problems or updates.  Thank you very much for allowing me to participate in the care of this nice patient. If I can be of any further assistance to you please  do not hesitate to call me at [phone].  Sincerely,   Star Age, MD, PhD

## 2014-01-03 ENCOUNTER — Ambulatory Visit (INDEPENDENT_AMBULATORY_CARE_PROVIDER_SITE_OTHER): Payer: 59 | Admitting: Neurology

## 2014-01-03 DIAGNOSIS — G4733 Obstructive sleep apnea (adult) (pediatric): Secondary | ICD-10-CM

## 2014-01-03 DIAGNOSIS — G479 Sleep disorder, unspecified: Secondary | ICD-10-CM

## 2014-01-03 DIAGNOSIS — G4730 Sleep apnea, unspecified: Secondary | ICD-10-CM

## 2014-01-03 DIAGNOSIS — E669 Obesity, unspecified: Secondary | ICD-10-CM

## 2014-01-03 DIAGNOSIS — Z8249 Family history of ischemic heart disease and other diseases of the circulatory system: Secondary | ICD-10-CM

## 2014-01-03 DIAGNOSIS — L439 Lichen planus, unspecified: Secondary | ICD-10-CM

## 2014-01-03 DIAGNOSIS — Z836 Family history of other diseases of the respiratory system: Secondary | ICD-10-CM

## 2014-01-03 DIAGNOSIS — G4710 Hypersomnia, unspecified: Secondary | ICD-10-CM

## 2014-01-04 NOTE — Sleep Study (Signed)
See media tab for full report  

## 2014-01-11 ENCOUNTER — Telehealth: Payer: Self-pay | Admitting: Neurology

## 2014-01-11 NOTE — Telephone Encounter (Signed)
Please call and notify the patient that the recent sleep study did confirm the diagnosis of obstructive sleep apnea, but it's mild overall. We can consider CPAP or may be able to treat with an oral appliance, but I would recommend treatment in one of these 2 forms. Using CPAP would require a repeat sleep study for proper titration and mask fitting. Treatment with a dental device would require a referral to a dentist, that makes these in custom-made appliances. Please explain to patient and let me know how she wants to proceed. Thanks,  Star Age, MD, PhD [facility])

## 2014-01-14 ENCOUNTER — Encounter: Payer: Self-pay | Admitting: *Deleted

## 2014-01-14 ENCOUNTER — Other Ambulatory Visit: Payer: Self-pay | Admitting: Neurology

## 2014-01-14 DIAGNOSIS — G4733 Obstructive sleep apnea (adult) (pediatric): Secondary | ICD-10-CM

## 2014-01-14 NOTE — Telephone Encounter (Signed)
I called and spoke with the patient about her recent sleep study results. I informed the patient that the study has confirm the diagnosis of obstructive sleep apnea, but it's mild overall. I informed the patient that Dr. Rexene Alberts has suggested two possible treatments, one being CPAP which will required another overnight study for proper titration and mask fitting and second would be oral appliance. Patient stated she will comeback for CPAP titration study. I will fax a copy of the report to Dr. Florina Ou and  Reita Cliche, NP. I will mail a copy of the report to the patient.

## 2014-02-05 ENCOUNTER — Ambulatory Visit (INDEPENDENT_AMBULATORY_CARE_PROVIDER_SITE_OTHER): Payer: 59

## 2014-02-05 DIAGNOSIS — G4733 Obstructive sleep apnea (adult) (pediatric): Secondary | ICD-10-CM

## 2014-02-05 DIAGNOSIS — G479 Sleep disorder, unspecified: Secondary | ICD-10-CM

## 2014-02-13 ENCOUNTER — Telehealth: Payer: Self-pay | Admitting: Neurology

## 2014-02-13 DIAGNOSIS — G4733 Obstructive sleep apnea (adult) (pediatric): Secondary | ICD-10-CM

## 2014-02-13 NOTE — Telephone Encounter (Signed)
Please call and inform patient that I have entered an order for treatment with PAP. She did well during the latest sleep study with CPAP. We will, therefore, arrange for a machine for home use through a DME (durable medical equipment) company of Her choice; and I will see the patient back in follow-up in about 6 weeks. Please also explain to the patient that I will be looking out for compliance data downloaded from the machine, which can be done remotely through a modem at times or stored on an SD card in the back of the machine. At the time of the followup appointment we will discuss sleep study results and how it is going with PAP treatment at home. Please advise patient to bring Her machine at the time of the visit; at least for the first visit, even though this is cumbersome. Bringing the machine for every visit after that may not be needed, but often helps for the first visit. Please also make sure, the patient has a follow-up appointment with me in about 6 weeks from the setup date, thanks.   Talaysha Freeberg, MD, PhD [facility] ([facility])  

## 2014-02-15 ENCOUNTER — Encounter: Payer: Self-pay | Admitting: *Deleted

## 2014-02-15 NOTE — Telephone Encounter (Signed)
I called and left a message for the patient about her recent CPAP titration study results. I informed the patient that she did well on CPAP during the night of her last study and therefore, Dr. Rexene Alberts wants her to start CPAP therapy at home. I will send her order to [place] who will contact her once they receive her benefits. I will fax a copy of the report to Dr. Modena Morrow and Reita Cliche, NP office. I will mail a copy of the report to the patient along with a follow up instruction letter.

## 2014-03-26 ENCOUNTER — Encounter: Payer: Self-pay | Admitting: Neurology

## 2014-04-05 NOTE — Progress Notes (Signed)
Quick Note:  I reviewed the patient's CPAP compliance data from 02/23/2014 to 03/24/2014, which is a total of 30 days, during which time the patient used CPAP every day. The average usage for all days was 7 hours and 59 minutes. The percent used days greater than 4 hours was 100 %, indicating superb compliance. The residual AHI was 0.9 per hour, indicating an appropriate treatment pressure of 8 cwp with EPR of 3. Air leak from the mask was low at 5.9 L per minute at the 95th percentile. I will review this data with the patient at the next office visit, which is currently routinely scheduled for 04/11/2014 at 9 AM, provide feedback and additional troubleshooting if need be.  Star Age, MD, PhD [facility] ([facility]   ______

## 2014-04-11 ENCOUNTER — Ambulatory Visit: Payer: 59 | Admitting: Neurology

## 2014-04-16 ENCOUNTER — Ambulatory Visit (INDEPENDENT_AMBULATORY_CARE_PROVIDER_SITE_OTHER): Payer: 59 | Admitting: Neurology

## 2014-04-16 ENCOUNTER — Encounter: Payer: Self-pay | Admitting: Neurology

## 2014-04-16 ENCOUNTER — Encounter (INDEPENDENT_AMBULATORY_CARE_PROVIDER_SITE_OTHER): Payer: Self-pay

## 2014-04-16 VITALS — BP 137/77 | HR 68 | Temp 98.0°F | Ht 64.0 in | Wt 235.0 lb

## 2014-04-16 DIAGNOSIS — G4733 Obstructive sleep apnea (adult) (pediatric): Secondary | ICD-10-CM

## 2014-04-16 DIAGNOSIS — E669 Obesity, unspecified: Secondary | ICD-10-CM

## 2014-04-16 NOTE — Progress Notes (Signed)
Subjective:    Patient ID: Denise Barnett is a 43 y.o. female.  HPI    Interim history:   Denise Barnett is a very pleasant 43 year old right-handed woman with any underlying medical history of seasonal allergies, vertigo, and recurrent sinusitis, who presents for followup consultation after a recent set of sleep studies. She is unaccompanied today and cancelled an appointment for 04/11/14. I first met her on 12/25/2013 at the request of her primary care provider, at which time the patient reported loud snoring, a family history of sleep apnea and complains of excessive daytime somnolence. I suggested she return for a sleep study. She had baseline sleep study on 01/04/2014, followed by a CPAP titration study on 02/05/2014. I went over all her test results in detail today. Her baseline sleep study from 01/04/2014 showed a sleep efficiency of 81.6% with a prolonged sleep latency of 48 minutes and wake after sleep onset of 36 minutes with moderate sleep fragmentation noted. She had an elevated arousal index of 39.4 per hour. She had an elevation in light stage sleep, mildly decreased percentage of slow-wave sleep, and a mildly decreased percentage of REM sleep with a prolonged REM latency of 169 minutes. She had no significant periodic leg movements of sleep and no significant EKG changes. She had mild to moderate and sometimes loud snoring. She had a total AHI of 5.5 per hour, rising to 11.5 per hour in the supine position. Baseline oxygen saturation was 94% with a nadir of 88% in REM sleep. Based on her sleep related complaints I felt that she would be a candidate for treatment for sleep apnea and I felt the main 2 options would be CPAP versus an oral appliance. She elected to come back for a CPAP titration study which she had on 02/05/2014: Sleep efficiency was 74.8%. She had a prolonged sleep latency of 41 minutes and a wake after sleep onset of 65 minutes with moderate sleep fragmentation noted. She had an  increased percentage of stage I sleep, a normal percentage of stage II sleep, a normal percentage of slow-wave sleep, and a reduced percentage of REM sleep with a prolonged REM latency. She had no significant periodic leg movements of sleep and no significant EKG changes. She was titrated on CPAP from 5-9 cm with elimination of her sleep disordered breathing with a pressure of 8 cm. She did indicate that she felt she slept better than usual. Based on the test results I placed her on CPAP therapy at home. I reviewed her compliance data from 02/23/2014 to 03/24/2014, which is a total of 30 days, during which time the patient used CPAP every day. The average usage for all days was 7 hours and 59 minutes. The percent used days greater than 4 hours was 100 %, indicating superb compliance. The residual AHI was 0.9 per hour, indicating an appropriate treatment pressure of 8 cwp with EPR of 3. Air leak from the mask was low at 5.9 L per minute at the 95th percentile.  Today, I reviewed her compliance data from 03/17/2014 through 04/15/2014 which is a total of 30 days during which time she uses CPAP every night, with an average usage of 8 hours and 51 minutes. Percent used days greater than 4 hours was 100%, indicating superb compliance with a residual AHI of 0.8 per hour and very low leak. Today, she reports, that she feels better rested, she no longer snores, and her EDS is better. She has adjusted well to the pressure and  the nasal pillows. She was sore at her nostrils in the first week. She has had no significant sinus issues since starting CPAP.   Her Past Medical History Is Significant For: Past Medical History  Diagnosis Date  . Asthma   . Bronchitis   . Pneumonia   . OSA (obstructive sleep apnea) 12/25/2013    Her Past Surgical History Is Significant For: Past Surgical History  Procedure Laterality Date  . None      Her Family History Is Significant For: Family History  Problem Relation Age of  Onset  . Sleep apnea Mother   . Heart attack Brother     Her Social History Is Significant For: History   Social History  . Marital Status: Single    Spouse Name: N/A    Number of Children: 1  . Years of Education: 12   Occupational History  . [employer]    Social History Main Topics  . Smoking status: Former Research scientist (life sciences)  . Smokeless tobacco: Never Used  . Alcohol Use: No  . Drug Use: No  . Sexual Activity: Yes   Other Topics Concern  . None   Social History Narrative  . None    Her Allergies Are:  Allergies  Allergen Reactions  . Penicillins Rash  :   Her Current Medications Are:  Outpatient Encounter Prescriptions as of 04/16/2014  Medication Sig  . ibuprofen (ADVIL,MOTRIN) 200 MG tablet Take 400 mg by mouth daily as needed. For pain  . lidocaine (XYLOCAINE) 2 % jelly 2 application by Other route as needed.  . Norgestimate-Ethinyl Estradiol Triphasic 0.18/0.215/0.25 MG-35 MCG tablet Take 1 tablet by mouth daily.  . [DISCONTINUED] fluticasone (FLONASE) 50 MCG/ACT nasal spray Place 50 sprays into both nostrils as needed.  . [DISCONTINUED] hydroxychloroquine (PLAQUENIL) 200 MG tablet Take 1 tablet by mouth 2 (two) times daily.  . [DISCONTINUED] montelukast (SINGULAIR) 10 MG tablet Take 10 mg by mouth as needed.  :  Review of Systems:  Out of a complete 14 point review of systems, all are reviewed and negative with the exception of these symptoms as listed below:  Review of Systems  Unable to perform ROS: Other  Psychiatric/Behavioral: Positive for sleep disturbance.       Snoring    Objective:  Neurologic Exam  Physical Exam Physical Examination:   Filed Vitals:   04/16/14 1218  BP: 137/77  Pulse: 68  Temp: 98 F (36.7 C)   General Examination: The patient is a very pleasant 43 y.o. female in no acute distress. She appears well-developed and well-nourished and adequately groomed. She is obese.   HEENT: Normocephalic, atraumatic, pupils are  equal, round and reactive to light and accommodation. Funduscopic exam is normal with sharp disc margins noted. Extraocular tracking is good without limitation to gaze excursion or nystagmus noted. Normal smooth pursuit is noted. Hearing is grossly intact. Face is symmetric with normal facial animation and normal facial sensation. Speech is clear with no dysarthria noted. There is no hypophonia. There is no lip, neck/head, jaw or voice tremor. Neck is supple with full range of passive and active motion. There are no carotid bruits on auscultation. Oropharynx exam reveals: mild mouth dryness, adequate dental hygiene and mild airway crowding, due to narrow airway entry and elongated tongue. Mallampati is class II. Tongue protrudes centrally and palate elevates symmetrically. Tonsils are 1+, L a little larger than R. Neck size is 17.25 inches. She has a tiny overbite. No sores are seen at her nostrils.  Chest: Clear to auscultation without wheezing, rhonchi or crackles noted.  Heart: S1+S2+0, regular and normal without murmurs, rubs or gallops noted.   Abdomen: Soft, non-tender and non-distended with normal bowel sounds appreciated on auscultation.  Extremities: There is no pitting edema in the distal lower extremities bilaterally. Pedal pulses are intact.  Skin: Warm and dry without trophic changes noted. There are no varicose veins. She has eczema over her dorsal aspects of her hands and also a peeling rash on the palmar aspect of her hands  Musculoskeletal: exam reveals no obvious joint deformities, tenderness or joint swelling or erythema.   Neurologically:  Mental status: The patient is awake, alert and oriented in all 4 spheres. Her memory, attention, language and knowledge are appropriate. There is no aphasia, agnosia, apraxia or anomia. Speech is clear with normal prosody and enunciation. Thought process is linear. Mood is congruent and affect is normal.  Cranial nerves are as described above  under HEENT exam. In addition, shoulder shrug is normal with equal shoulder height noted. Motor exam: Normal bulk, strength and tone is noted. There is no drift, tremor or rebound. Romberg is negative. Reflexes are 2+ throughout. Toes are downgoing bilaterally. Fine motor skills are intact with normal finger taps, normal hand movements, normal rapid alternating patting, normal foot taps and normal foot agility.  Cerebellar testing shows no dysmetria or intention tremor on finger to nose testing. Heel to shin is unremarkable bilaterally. There is no truncal or gait ataxia.  Sensory exam is intact to light touch, pinprick, vibration, temperature sense in the upper and lower extremities.  Gait, station and balance are unremarkable. No veering to one side is noted. No leaning to one side is noted. Posture is age-appropriate and stance is narrow based. No problems turning are noted. She turns en bloc. Tandem walk is unremarkable. Intact toe and heel stance is noted.              Assessment and Plan:   In summary, Fatimah M Iseminger is a very pleasant 43 y.o.-year old female with any underlying medical history of seasonal allergies, vertigo, dermatitis, obesity, and recurrent sinusitis, who has mild OSA with EDS and is now on treatment with CPAP at a pressure of 8 cwp. Her physical exam is stable and She indicates good results with the use of CPAP, and good tolerance of the pressure and mask. I reviewed her sleep study data as well as her compliance data with the patient in detail today. I commended her for been fully compliant with CPAP treatment and encouraged her to continue to use CPAP regularly to help reduce cardiovascular risk and to help with her daytime somnolence and nighttime sleep consolidation. She does endorse improvement of her daytime sleepiness and nighttime sleep since starting CPAP. She is pleased with how she is doing.   We also talked about trying to maintaining a healthy lifestyle in general.  I encouraged the patient to eat healthy, exercise daily and keep well hydrated, to keep a scheduled bedtime and wake time routine, to not skip any meals and eat healthy snacks in between meals and to have protein with every meal. I stressed the importance of regular exercise.   I answered all her questions today and the patient was in agreement with the above outlined plan. I would like to see the patient back in 6 months, sooner if the need arises and encouraged her to call with any interim questions, concerns, problems or updates.

## 2014-04-16 NOTE — Patient Instructions (Signed)

## 2014-10-17 ENCOUNTER — Ambulatory Visit: Payer: 59 | Admitting: Neurology

## 2015-05-18 ENCOUNTER — Emergency Department (HOSPITAL_COMMUNITY)
Admission: EM | Admit: 2015-05-18 | Discharge: 2015-05-18 | Disposition: A | Discharge status: Home / Self Care | Payer: 59 | Attending: Emergency Medicine | Admitting: Emergency Medicine

## 2015-05-18 ENCOUNTER — Encounter (HOSPITAL_COMMUNITY): Payer: Self-pay | Admitting: Emergency Medicine

## 2015-05-18 DIAGNOSIS — Z8701 Personal history of pneumonia (recurrent): Secondary | ICD-10-CM | POA: Diagnosis not present

## 2015-05-18 DIAGNOSIS — M79674 Pain in right toe(s): Secondary | ICD-10-CM | POA: Diagnosis present

## 2015-05-18 DIAGNOSIS — J45909 Unspecified asthma, uncomplicated: Secondary | ICD-10-CM | POA: Diagnosis not present

## 2015-05-18 DIAGNOSIS — Z872 Personal history of diseases of the skin and subcutaneous tissue: Secondary | ICD-10-CM | POA: Diagnosis not present

## 2015-05-18 DIAGNOSIS — Z880 Allergy status to penicillin: Secondary | ICD-10-CM | POA: Insufficient documentation

## 2015-05-18 DIAGNOSIS — Z8669 Personal history of other diseases of the nervous system and sense organs: Secondary | ICD-10-CM | POA: Insufficient documentation

## 2015-05-18 DIAGNOSIS — Z793 Long term (current) use of hormonal contraceptives: Secondary | ICD-10-CM | POA: Diagnosis not present

## 2015-05-18 DIAGNOSIS — Z87891 Personal history of nicotine dependence: Secondary | ICD-10-CM | POA: Insufficient documentation

## 2015-05-18 DIAGNOSIS — B353 Tinea pedis: Secondary | ICD-10-CM | POA: Insufficient documentation

## 2015-05-18 HISTORY — DX: Lichen planus, unspecified: L43.9

## 2015-05-18 MED ORDER — TRAMADOL HCL 50 MG PO TABS: 50.0000 mg | ORAL_TABLET | Freq: Four times a day (QID) | ORAL | Status: DC | PRN

## 2015-05-18 MED ORDER — TERBINAFINE HCL 1 % EX CREA: 1.0000 "application " | TOPICAL_CREAM | Freq: Two times a day (BID) | CUTANEOUS | Status: DC

## 2015-05-18 MED ORDER — CEPHALEXIN 500 MG PO CAPS: 500.0000 mg | ORAL_CAPSULE | Freq: Four times a day (QID) | ORAL | Status: DC

## 2015-05-18 NOTE — ED Provider Notes (Signed)
CSN: [id number]     Arrival date & time 05/18/15  1453 History  This chart was scribed for non-physician provider Clayton Bibles, PA-C, working with Elnora Morrison, MD by Irene Pap, ED Scribe. This patient was seen in room APFT21/APFT21 and patient care was started at 3:15 PM.   Chief Complaint  Patient presents with  . Toe Pain   The history is provided by the patient. No language interpreter was used.   HPI Comments: Denise Barnett is a 44 y.o. female who presents to the Emergency Department complaining of gradually worsening right 4th toe pain onset 6 days ago. She states that she woke up Tuesday morning with tingling in her toe; states that she noticed a bump on the back of the toe Thursday. She states that the pain worsens with walking and that the top of her toe itches. Pt states that she worked today and was on her feet for 8 hours. She reports taking 4 ibuprofen with no relief. She denies any new injuries, new shoes, fever, numbness, weakness, chills, nausea, or vomiting. She states that she was recently put on Metformin for possible diabetes; states that her glucose was elevated at 105 when she last saw the doctor. Pt reports history of psoriasis and reports allergies to penicillin. She states that she has never seen a podiatrist.   Past Medical History  Diagnosis Date  . Asthma   . Bronchitis   . Pneumonia   . OSA (obstructive sleep apnea) 12/25/2013  . Lichen planus    Past Surgical History  Procedure Laterality Date  . None     Family History  Problem Relation Age of Onset  . Sleep apnea Mother   . Heart attack Brother    History  Substance Use Topics  . Smoking status: Former Research scientist (life sciences)  . Smokeless tobacco: Never Used  . Alcohol Use: No   OB History    No data available     Review of Systems  Constitutional: Negative for fever and chills.  Respiratory: Negative for shortness of breath.   Cardiovascular: Negative for chest pain.  Gastrointestinal: Negative for  nausea and vomiting.  Musculoskeletal: Positive for arthralgias.  Skin: Positive for color change. Negative for wound.  Neurological: Negative for weakness and numbness.  Hematological: Does not bruise/bleed easily.  Psychiatric/Behavioral: Negative for self-injury.   Allergies  Penicillins  Home Medications   Prior to Admission medications   Medication Sig Start Date End Date Taking? Authorizing Provider  ibuprofen (ADVIL,MOTRIN) 200 MG tablet Take 400 mg by mouth daily as needed. For pain    Historical Provider, MD  lidocaine (XYLOCAINE) 2 % jelly 2 application by Other route as needed. 11/18/13   Historical Provider, MD  Norgestimate-Ethinyl Estradiol Triphasic 0.18/0.215/0.25 MG-35 MCG tablet Take 1 tablet by mouth daily. 09/23/13   Historical Provider, MD   BP 153/85 mmHg  Pulse 88  Temp(Src) 99 F (37.2 C) (Oral)  Resp 17  Ht 5\' 2"  (1.575 m)  Wt 233 lb (105.688 kg)  BMI 42.61 kg/m2  SpO2 100%  LMP 05/18/2015  Physical Exam  Constitutional: She appears well-developed and well-nourished. No distress.  HENT:  Head: Normocephalic and atraumatic.  Neck: Neck supple.  Pulmonary/Chest: Effort normal.  Neurological: She is alert.  Skin: She is not diaphoretic.  Macerated skin between 4th and 5th toe with tiny outline of erythema, nodule vs thickened blister on the plantar aspect of base of 4th toe, tender to palpation.  No edema or warmth, no discharge.  Psychiatric: She has a normal mood and affect. Her behavior is normal.  Nursing note and vitals reviewed.  ED Course  Procedures (including critical care time) DIAGNOSTIC STUDIES: Oxygen Saturation is 100% on room air, normal by my interpretation.    COORDINATION OF CARE: 3:21 PM-Discussed treatment plan which includes anti-biotic, topical treatment, and follow up with podiatrist or PCP with pt at bedside and pt agreed to plan.   Labs Review Labs Reviewed - No data to display  Imaging Review No results found.    EKG Interpretation None      MDM   Final diagnoses:  Tinea pedis of right foot  Toe pain, right    Afebrile, nontoxic patient with right 4th toe pain with hardened spot on plantar aspect of toe, associated macerated skin and tiny ring of erythema around it.  Pt has no known injury.  This does not appear to be a foreign body.  It appears to be a blister as it has a clear wall/clear fluid in it.  Doubt abscess. The macerated area appears c/w tinea and there is a thin line of erythema and tenderness that is very mild but I will treat for superinfection with keflex. Pt has no systemic symptoms.  Neurovascularly intact.  D/C home with lamisil, keflex, tramadol, podiatry follow up.  Discussed result, findings, treatment, and follow up  with patient.  Pt given return precautions.  Pt verbalizes understanding and agrees with plan.        I personally performed the services described in this documentation, which was scribed in my presence. The recorded information has been reviewed and is accurate.    Clayton Bibles, PA-C 05/18/15 1642  Elnora Morrison, MD 05/19/15 [phone]

## 2015-05-18 NOTE — ED Notes (Signed)
Pt verbalized understanding of no driving and to use caution within 4 hours of taking pain meds due to meds cause drowsiness 

## 2015-05-18 NOTE — Discharge Instructions (Signed)
Read the information below.  Use the prescribed medication as directed.  Please discuss all new medications with your pharmacist.  You may return to the Emergency Department at any time for worsening condition or any new symptoms that concern you.    If you develop increased redness, swelling, pus draining from the wound, or fevers greater than 100.4, return to the ER immediately for a recheck.     Athlete's Foot Athlete's foot (tinea pedis) is a fungal infection of the skin on the feet. It often occurs on the skin between the toes or underneath the toes. It can also occur on the soles of the feet. Athlete's foot is more likely to occur in hot, humid weather. Not washing your feet or changing your socks often enough can contribute to athlete's foot. The infection can spread from person to person (contagious). CAUSES Athlete's foot is caused by a fungus. This fungus thrives in warm, moist places. Most people get athlete's foot by sharing shower stalls, towels, and wet floors with an infected person. People with weakened immune systems, including those with diabetes, may be more likely to get athlete's foot. SYMPTOMS   Itchy areas between the toes or on the soles of the feet.  White, flaky, or scaly areas between the toes or on the soles of the feet.  Tiny, intensely itchy blisters between the toes or on the soles of the feet.  Tiny cuts on the skin. These cuts can develop a bacterial infection.  Thick or discolored toenails. DIAGNOSIS  Your caregiver can usually tell what the problem is by doing a physical exam. Your caregiver may also take a skin sample from the rash area. The skin sample may be examined under a microscope, or it may be tested to see if fungus will grow in the sample. A sample may also be taken from your toenail for testing. TREATMENT  Over-the-counter and prescription medicines can be used to kill the fungus. These medicines are available as powders or creams. Your caregiver  can suggest medicines for you. Fungal infections respond slowly to treatment. You may need to continue using your medicine for several weeks. PREVENTION   Do not share towels.  Wear sandals in wet areas, such as shared locker rooms and shared showers.  Keep your feet dry. Wear shoes that allow air to circulate. Wear cotton or wool socks. HOME CARE INSTRUCTIONS   Take medicines as directed by your caregiver. Do not use steroid creams on athlete's foot.  Keep your feet clean and cool. Wash your feet daily and dry them thoroughly, especially between your toes.  Change your socks every day. Wear cotton or wool socks. In hot climates, you may need to change your socks 2 to 3 times per day.  Wear sandals or canvas tennis shoes with good air circulation.  If you have blisters, soak your feet in Burow's solution or Epsom salts for 20 to 30 minutes, 2 times a day to dry out the blisters. Make sure you dry your feet thoroughly afterward. SEEK MEDICAL CARE IF:   You have a fever.  You have swelling, soreness, warmth, or redness in your foot.  You are not getting better after 7 days of treatment.  You are not completely cured after 30 days.  You have any problems caused by your medicines. MAKE SURE YOU:   Understand these instructions.  Will watch your condition.  Will get help right away if you are not doing well or get worse. Document Released: 11/26/2000 Document  Revised: 02/21/2012 Document Reviewed: 09/17/2011 [employer] Patient Information 2015 [place], [place]. This information is not intended to replace advice given to you by your health care provider. Make sure you discuss any questions you have with your health care provider.

## 2015-05-18 NOTE — ED Notes (Signed)
Pt states that she has a bump under her right 4th toe that is hurting.

## 2015-05-20 ENCOUNTER — Emergency Department (HOSPITAL_COMMUNITY): Payer: 59

## 2015-05-20 ENCOUNTER — Encounter (HOSPITAL_COMMUNITY): Payer: Self-pay | Admitting: Physical Medicine and Rehabilitation

## 2015-05-20 ENCOUNTER — Emergency Department (HOSPITAL_COMMUNITY)
Admission: EM | Admit: 2015-05-20 | Discharge: 2015-05-20 | Disposition: A | Discharge status: Home / Self Care | Payer: 59 | Attending: Emergency Medicine | Admitting: Emergency Medicine

## 2015-05-20 DIAGNOSIS — Z880 Allergy status to penicillin: Secondary | ICD-10-CM | POA: Diagnosis not present

## 2015-05-20 DIAGNOSIS — Z87891 Personal history of nicotine dependence: Secondary | ICD-10-CM | POA: Insufficient documentation

## 2015-05-20 DIAGNOSIS — E119 Type 2 diabetes mellitus without complications: Secondary | ICD-10-CM | POA: Diagnosis not present

## 2015-05-20 DIAGNOSIS — Z793 Long term (current) use of hormonal contraceptives: Secondary | ICD-10-CM | POA: Insufficient documentation

## 2015-05-20 DIAGNOSIS — J45909 Unspecified asthma, uncomplicated: Secondary | ICD-10-CM | POA: Diagnosis not present

## 2015-05-20 DIAGNOSIS — Z8701 Personal history of pneumonia (recurrent): Secondary | ICD-10-CM | POA: Diagnosis not present

## 2015-05-20 DIAGNOSIS — M79674 Pain in right toe(s): Secondary | ICD-10-CM | POA: Diagnosis present

## 2015-05-20 DIAGNOSIS — Z8669 Personal history of other diseases of the nervous system and sense organs: Secondary | ICD-10-CM | POA: Insufficient documentation

## 2015-05-20 DIAGNOSIS — L03031 Cellulitis of right toe: Secondary | ICD-10-CM | POA: Diagnosis not present

## 2015-05-20 HISTORY — DX: Type 2 diabetes mellitus without complications: E11.9

## 2015-05-20 IMAGING — DX DG TOE 4TH 2+V*R*
3 series · 3 of 3 positions shown · non-contrast
Comparison: None.

CLINICAL DATA: Fourth toe pain for 1 week with redness and
swelling, no known injury, initial encounter

EXAM:
RIGHT FOURTH TOE

[toe ap]
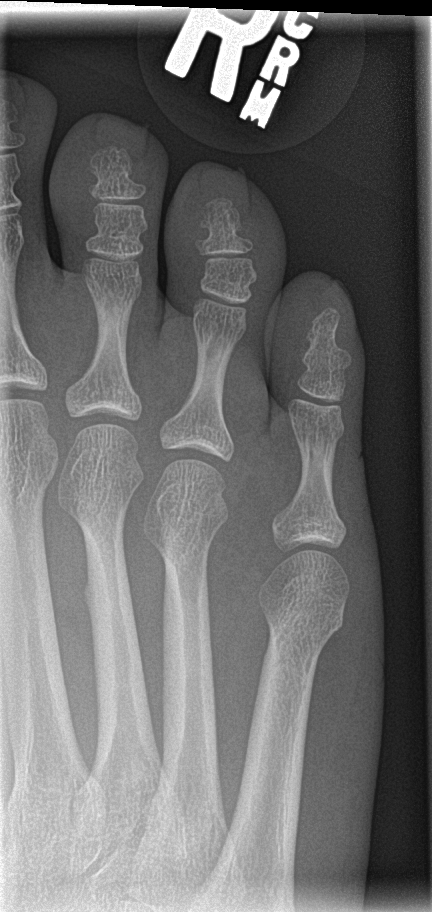

[toe obl]
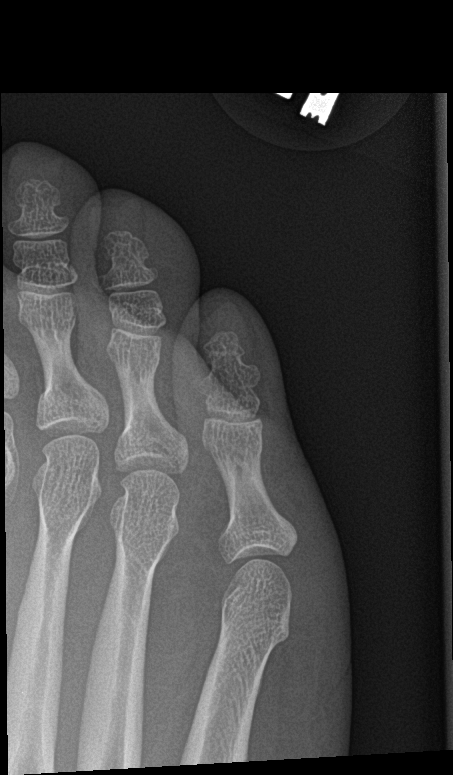

[toe lat]
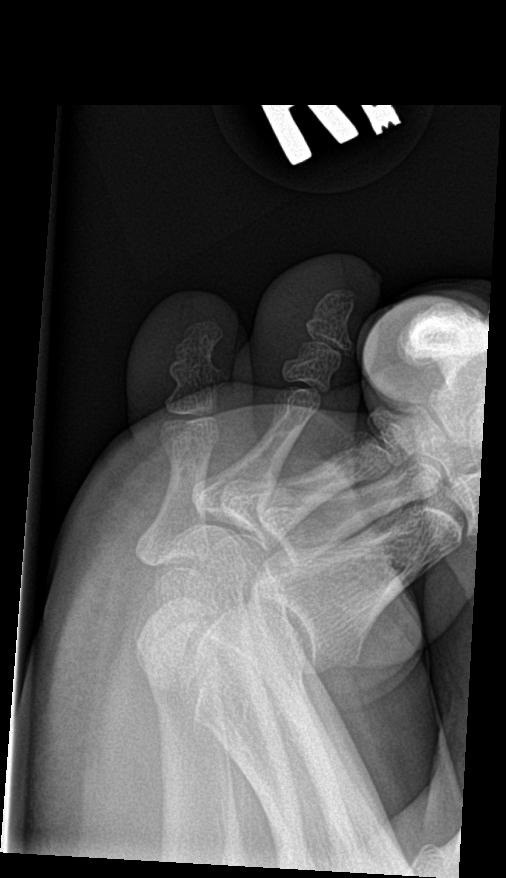

[3 of 3 positions shown; findings below may reference images not displayed]

FINDINGS: There is no evidence of fracture or dislocation. There is no
evidence of arthropathy or other focal bone abnormality. Soft
tissues are unremarkable.
IMPRESSION: No acute abnormality noted.

## 2015-05-20 MED ORDER — SULFAMETHOXAZOLE-TRIMETHOPRIM 800-160 MG PO TABS: 1.0000 | ORAL_TABLET | Freq: Two times a day (BID) | ORAL | Status: AC

## 2015-05-20 MED ORDER — TRAMADOL HCL 50 MG PO TABS: 50.0000 mg | ORAL_TABLET | Freq: Four times a day (QID) | ORAL | Status: DC | PRN

## 2015-05-20 NOTE — Discharge Instructions (Signed)
Please follow up with your primary care physician in 1-2 days. If you do not have one please call the [facility] number listed above. Please take your antibiotic until completion. Please take pain medication and/or muscle relaxants as prescribed and as needed for pain. Please do not drive on narcotic pain medication or on muscle relaxants. Please read all discharge instructions and return precautions.    Cellulitis Cellulitis is an infection of the skin and the tissue beneath it. The infected area is usually red and tender. Cellulitis occurs most often in the arms and lower legs.  CAUSES  Cellulitis is caused by bacteria that enter the skin through cracks or cuts in the skin. The most common types of bacteria that cause cellulitis are staphylococci and streptococci. SIGNS AND SYMPTOMS   Redness and warmth.  Swelling.  Tenderness or pain.  Fever. DIAGNOSIS  Your health care provider can usually determine what is wrong based on a physical exam. Blood tests may also be done. TREATMENT  Treatment usually involves taking an antibiotic medicine. HOME CARE INSTRUCTIONS   Take your antibiotic medicine as directed by your health care provider. Finish the antibiotic even if you start to feel better.  Keep the infected arm or leg elevated to reduce swelling.  Apply a warm cloth to the affected area up to 4 times per day to relieve pain.  Take medicines only as directed by your health care provider.  Keep all follow-up visits as directed by your health care provider. SEEK MEDICAL CARE IF:   You notice red streaks coming from the infected area.  Your red area gets larger or turns dark in color.  Your bone or joint underneath the infected area becomes painful after the skin has healed.  Your infection returns in the same area or another area.  You notice a swollen bump in the infected area.  You develop new symptoms.  You have a fever. SEEK IMMEDIATE MEDICAL CARE  IF:   You feel very sleepy.  You develop vomiting or diarrhea.  You have a general ill feeling (malaise) with muscle aches and pains. MAKE SURE YOU:   Understand these instructions.  Will watch your condition.  Will get help right away if you are not doing well or get worse. Document Released: 09/08/2005 Document Revised: 04/15/2014 Document Reviewed: 02/14/2012 [facility] Patient Information 2015 [place], [place]. This information is not intended to replace advice given to you by your health care provider. Make sure you discuss any questions you have with your health care provider.

## 2015-05-20 NOTE — ED Provider Notes (Signed)
CSN: [id number]     Arrival date & time 05/20/15  0818 History   First MD Initiated Contact with Patient 05/20/15 0827     Chief Complaint  Patient presents with  . Toe Pain     (Consider location/radiation/quality/duration/timing/severity/associated sxs/prior Treatment) HPI Comments: Patient is a 44 yo F past medical history significant for diabetes presenting to the emergency department for worsening redness after being seen on June 5 for right toe pain. She states at the time she was placed on Keflex and has been taking it as prescribed. She states she feels as though the redness is worsening. Denies any increased pain. Denies any fevers.No new injuries. Patient states tramadol helping her pain at home.   Past Medical History  Diagnosis Date  . Asthma   . Bronchitis   . Pneumonia   . OSA (obstructive sleep apnea) 12/25/2013  . Lichen planus   . Diabetes mellitus without complication    Past Surgical History  Procedure Laterality Date  . None     Family History  Problem Relation Age of Onset  . Sleep apnea Mother   . Heart attack Brother    History  Substance Use Topics  . Smoking status: Former Research scientist (life sciences)  . Smokeless tobacco: Never Used  . Alcohol Use: No   OB History    No data available     Review of Systems  Skin: Positive for color change and wound.  All other systems reviewed and are negative.     Allergies  Penicillins  Home Medications   Prior to Admission medications   Medication Sig Start Date End Date Taking? Authorizing Provider  cephALEXin (KEFLEX) 500 MG capsule Take 1 capsule (500 mg total) by mouth 4 (four) times daily. 05/18/15   Clayton Bibles, PA-C  ibuprofen (ADVIL,MOTRIN) 200 MG tablet Take 400 mg by mouth daily as needed. For pain    Historical Provider, MD  lidocaine (XYLOCAINE) 2 % jelly 2 application by Other route as needed. 11/18/13   Historical Provider, MD  Norgestimate-Ethinyl Estradiol Triphasic 0.18/0.215/0.25 MG-35 MCG tablet Take 1  tablet by mouth daily. 09/23/13   Historical Provider, MD  sulfamethoxazole-trimethoprim (BACTRIM DS,SEPTRA DS) 800-160 MG per tablet Take 1 tablet by mouth 2 (two) times daily. 05/20/15 05/27/15  Troyce Febo, PA-C  terbinafine (LAMISIL AT) 1 % cream Apply 1 application topically 2 (two) times daily. 05/18/15   Clayton Bibles, PA-C  traMADol (ULTRAM) 50 MG tablet Take 1 tablet (50 mg total) by mouth every 6 (six) hours as needed for moderate pain or severe pain. 05/18/15   Clayton Bibles, PA-C  traMADol (ULTRAM) 50 MG tablet Take 1 tablet (50 mg total) by mouth every 6 (six) hours as needed. 05/20/15   Naji Mehringer, PA-C   BP 141/85 mmHg  Pulse 79  Temp(Src) 98.8 F (37.1 C) (Oral)  Resp 18  SpO2 97%  LMP 05/18/2015 Physical Exam  Constitutional: She is oriented to person, place, and time. She appears well-developed and well-nourished. No distress.  HENT:  Head: Normocephalic and atraumatic.  Right Ear: External ear normal.  Left Ear: External ear normal.  Nose: Nose normal.  Mouth/Throat: Oropharynx is clear and moist.  Eyes: Conjunctivae are normal.  Neck: Normal range of motion. Neck supple.  No nuchal rigidity.   Cardiovascular: Normal rate and intact distal pulses.   Pulmonary/Chest: Effort normal.  Abdominal: Soft.  Musculoskeletal: Normal range of motion.  Neurological: She is alert and oriented to person, place, and time.  Skin: Skin is warm  and dry. She is not diaphoretic.  Macerated skin between 4th and 5th toe with erythema, nodule vs thickened blister on the plantar aspect of base of 4th toe, tender to palpation. Erythema without induration or fluctuance or drainage.  Psychiatric: She has a normal mood and affect.  Nursing note and vitals reviewed.   ED Course  Procedures (including critical care time)  Labs Review Labs Reviewed - No data to display  Imaging Review Dg Toe 4th Right  05/20/2015   CLINICAL DATA:  Fourth toe pain for 1 week with redness and  swelling, no known injury, initial encounter  EXAM: RIGHT FOURTH TOE  COMPARISON:  None.  FINDINGS: There is no evidence of fracture or dislocation. There is no evidence of arthropathy or other focal bone abnormality. Soft tissues are unremarkable.  IMPRESSION: No acute abnormality noted.   Electronically Signed   By: Inez Catalina M.D.   On: 05/20/2015 09:27     EKG Interpretation None      MDM   Final diagnoses:  Cellulitis of toe, right    Filed Vitals:   05/20/15 1009  BP: 141/85  Pulse: 79  Temp:   Resp: 18   Afebrile, NAD, non-toxic appearing, AAOx4.  Neurovascularly intact. Normal sensation. No evidence of compartment syndrome. Suspect uncomplicated cellulitis based on limited area of involvement, minimal pain, no systemic signs of illness (eg, fever, chills, dehydration, altered mental status, tachypnea, tachycardia, hypotension). PE reveals redness, swelling, mildly tender, warm to touch. Skin intact, No bleeding. No bullae. Non purulent. Non circumferential.  Borders are not elevated or sharply demarcated.  Will prescribed Bactrim to cover for MRSA, direct pt to apply warm compresses and to return to ED for I&D if pain should increase or abscess should develop. Will provide dermatologist contact information and resource.       Baron Sane, PA-C 05/20/15 1214  Quintella Reichert, MD 05/20/15 1410

## 2015-05-20 NOTE — ED Notes (Signed)
Declined W/C at D/C and was escorted to lobby by RN. 

## 2015-05-20 NOTE — ED Notes (Signed)
Pt reports she takes metformin 500mg  once a day.

## 2015-05-20 NOTE — ED Notes (Signed)
Pt presents to department for evaluation of R 4th toe pain. Reports wound to back of toe and redness noted to foot. 8/10 pain upon arrival. Currently taking antibiotics. Pt reports increased pain today.

## 2015-05-30 ENCOUNTER — Emergency Department (HOSPITAL_COMMUNITY)
Admission: EM | Admit: 2015-05-30 | Discharge: 2015-05-30 | Disposition: A | Discharge status: Home / Self Care | Payer: 59 | Attending: Emergency Medicine | Admitting: Emergency Medicine

## 2015-05-30 ENCOUNTER — Encounter (HOSPITAL_COMMUNITY): Payer: Self-pay

## 2015-05-30 DIAGNOSIS — J45909 Unspecified asthma, uncomplicated: Secondary | ICD-10-CM | POA: Insufficient documentation

## 2015-05-30 DIAGNOSIS — Z793 Long term (current) use of hormonal contraceptives: Secondary | ICD-10-CM | POA: Insufficient documentation

## 2015-05-30 DIAGNOSIS — Z87891 Personal history of nicotine dependence: Secondary | ICD-10-CM | POA: Diagnosis not present

## 2015-05-30 DIAGNOSIS — L509 Urticaria, unspecified: Secondary | ICD-10-CM | POA: Diagnosis not present

## 2015-05-30 DIAGNOSIS — Z792 Long term (current) use of antibiotics: Secondary | ICD-10-CM | POA: Insufficient documentation

## 2015-05-30 DIAGNOSIS — Z8669 Personal history of other diseases of the nervous system and sense organs: Secondary | ICD-10-CM | POA: Diagnosis not present

## 2015-05-30 DIAGNOSIS — Z79899 Other long term (current) drug therapy: Secondary | ICD-10-CM | POA: Insufficient documentation

## 2015-05-30 DIAGNOSIS — Z8701 Personal history of pneumonia (recurrent): Secondary | ICD-10-CM | POA: Diagnosis not present

## 2015-05-30 DIAGNOSIS — Z880 Allergy status to penicillin: Secondary | ICD-10-CM | POA: Insufficient documentation

## 2015-05-30 MED ORDER — DIPHENHYDRAMINE HCL 50 MG/ML IJ SOLN
50.0000 mg | Freq: Once | INTRAMUSCULAR | Status: AC
  Administered 2015-05-30: 50 mg via INTRAMUSCULAR
  Filled 2015-05-30: qty 1

## 2015-05-30 MED ORDER — PREDNISONE 10 MG PO TABS
60.0000 mg | ORAL_TABLET | Freq: Once | ORAL | Status: AC
Start: 2015-05-30 — End: 2015-05-30
  Administered 2015-05-30: 60 mg via ORAL
  Filled 2015-05-30 (×2): qty 1

## 2015-05-30 MED ORDER — PREDNISONE 50 MG PO TABS: ORAL_TABLET | ORAL | Status: DC

## 2015-05-30 MED ORDER — EPINEPHRINE 0.3 MG/0.3ML IJ SOAJ: 0.3000 mg | Freq: Once | INTRAMUSCULAR | Status: DC

## 2015-05-30 MED ORDER — DIPHENHYDRAMINE HCL 25 MG PO TABS: 50.0000 mg | ORAL_TABLET | ORAL | Status: DC | PRN

## 2015-05-30 NOTE — ED Provider Notes (Signed)
CSN: [id number]     Arrival date & time 05/30/15  0201 History   First MD Initiated Contact with Patient 05/30/15 0225     Chief Complaint  Patient presents with  . Urticaria     Patient is a 44 y.o. female presenting with urticaria. The history is provided by the patient.  Urticaria This is a new problem. The current episode started 3 to 5 hours ago. The problem occurs constantly. The problem has been gradually worsening. Pertinent negatives include no chest pain, no abdominal pain and no shortness of breath. Nothing aggravates the symptoms. Nothing relieves the symptoms. She has tried nothing for the symptoms.  pt reports onset of hives No sob No angioedema No vomiting/diarrhea She has never had this before She has been on keflex/bactrim for two weeks for infected toe, supposed to complete therapy this weekend No other new exposures No tick/insect bites No new foods/exposures  Past Medical History  Diagnosis Date  . Asthma   . Bronchitis   . Pneumonia   . OSA (obstructive sleep apnea) 12/25/2013  . Lichen planus   . Diabetes mellitus without complication    Past Surgical History  Procedure Laterality Date  . None     Family History  Problem Relation Age of Onset  . Sleep apnea Mother   . Heart attack Brother    History  Substance Use Topics  . Smoking status: Former Research scientist (life sciences)  . Smokeless tobacco: Never Used  . Alcohol Use: No   OB History    No data available     Review of Systems  Constitutional: Negative for fever.  Respiratory: Negative for shortness of breath.   Cardiovascular: Negative for chest pain.  Gastrointestinal: Negative for vomiting, abdominal pain and diarrhea.  Neurological: Negative for syncope.  All other systems reviewed and are negative.     Allergies  Penicillins  Home Medications   Prior to Admission medications   Medication Sig Start Date End Date Taking? Authorizing Provider  ibuprofen (ADVIL,MOTRIN) 200 MG tablet Take 400 mg  by mouth daily as needed. For pain   Yes Historical Provider, MD  lidocaine (XYLOCAINE) 2 % jelly 2 application by Other route as needed. 11/18/13  Yes Historical Provider, MD  Norgestimate-Ethinyl Estradiol Triphasic 0.18/0.215/0.25 MG-35 MCG tablet Take 1 tablet by mouth daily. 09/23/13  Yes Historical Provider, MD  terbinafine (LAMISIL AT) 1 % cream Apply 1 application topically 2 (two) times daily. 05/18/15  Yes Clayton Bibles, PA-C  traMADol (ULTRAM) 50 MG tablet Take 1 tablet (50 mg total) by mouth every 6 (six) hours as needed for moderate pain or severe pain. 05/18/15  Yes Clayton Bibles, PA-C   BP 118/74 mmHg  Pulse 101  Temp(Src) 98 F (36.7 C) (Oral)  Resp 20  Ht 5\' 2"  (1.575 m)  Wt 232 lb (105.235 kg)  BMI 42.42 kg/m2  SpO2 97%  LMP 05/18/2015 Physical Exam CONSTITUTIONAL: Well developed/well nourished HEAD: Normocephalic/atraumatic EYES: EOMI/PERRL ENMT: Mucous membranes moist, no angioedema NECK: supple no meningeal signs SPINE/BACK:entire spine nontender CV: S1/S2 noted, no murmurs/rubs/gallops noted LUNGS: Lungs are clear to auscultation bilaterally, no apparent distress ABDOMEN: soft, nontender, no rebound or guarding, bowel sounds noted throughout abdomen GU:no cva tenderness NEURO: Pt is awake/alert/appropriate, moves all extremitiesx4.  No facial droop.   EXTREMITIES: pulses normal/equal, full ROM, wound to right 4th/5th toe is well healed without erythema/drainage noted SKIN: warm, color normal, urticaria noted to arms/legs  PSYCH: no abnormalities of mood noted, alert and oriented to situation  ED Course  Procedures  2:38 AM Pt will stop ABX as wound is well healed as this may be cause of rash Will give benadryl/prednisone Will monitor in ED No signs of anaphylaxis 4:01 AM Pt reports improvement Her itching is improved No worsening of her rash Will monitor for another hour then d/c home  Medications  predniSONE (DELTASONE) tablet 60 mg (60 mg Oral Given 05/30/15  0238)  diphenhydrAMINE (BENADRYL) injection 50 mg (50 mg Intramuscular Given 05/30/15 0238)     MDM   Final diagnoses:  Urticaria    Nursing notes including past medical history and social history reviewed and considered in documentation     Ripley Fraise, MD 05/30/15 0401

## 2015-05-30 NOTE — ED Notes (Signed)
Pt being treated for cellulitis on her toe, states she is on Bactrim and Cephalexin.  Pt states at 9 pm tonight she started breaking out in hives.  Pt has not had any benadryl.

## 2015-06-12 DIAGNOSIS — E785 Hyperlipidemia, unspecified: Secondary | ICD-10-CM | POA: Insufficient documentation

## 2015-10-27 ENCOUNTER — Ambulatory Visit: Payer: 59 | Admitting: Family Medicine

## 2015-12-08 ENCOUNTER — Encounter (HOSPITAL_COMMUNITY): Payer: Self-pay | Admitting: *Deleted

## 2015-12-08 ENCOUNTER — Emergency Department (HOSPITAL_COMMUNITY)
Admission: EM | Admit: 2015-12-08 | Discharge: 2015-12-08 | Disposition: A | Discharge status: Home / Self Care | Payer: 59 | Attending: Emergency Medicine | Admitting: Emergency Medicine

## 2015-12-08 DIAGNOSIS — Z79899 Other long term (current) drug therapy: Secondary | ICD-10-CM | POA: Insufficient documentation

## 2015-12-08 DIAGNOSIS — Z793 Long term (current) use of hormonal contraceptives: Secondary | ICD-10-CM | POA: Insufficient documentation

## 2015-12-08 DIAGNOSIS — Z8669 Personal history of other diseases of the nervous system and sense organs: Secondary | ICD-10-CM | POA: Insufficient documentation

## 2015-12-08 DIAGNOSIS — Z880 Allergy status to penicillin: Secondary | ICD-10-CM | POA: Insufficient documentation

## 2015-12-08 DIAGNOSIS — L049 Acute lymphadenitis, unspecified: Secondary | ICD-10-CM

## 2015-12-08 DIAGNOSIS — E119 Type 2 diabetes mellitus without complications: Secondary | ICD-10-CM | POA: Insufficient documentation

## 2015-12-08 DIAGNOSIS — Z87891 Personal history of nicotine dependence: Secondary | ICD-10-CM | POA: Insufficient documentation

## 2015-12-08 DIAGNOSIS — J45909 Unspecified asthma, uncomplicated: Secondary | ICD-10-CM | POA: Insufficient documentation

## 2015-12-08 DIAGNOSIS — Z8701 Personal history of pneumonia (recurrent): Secondary | ICD-10-CM | POA: Insufficient documentation

## 2015-12-08 DIAGNOSIS — L040 Acute lymphadenitis of face, head and neck: Secondary | ICD-10-CM | POA: Insufficient documentation

## 2015-12-08 MED ORDER — CLINDAMYCIN HCL 150 MG PO CAPS
300.0000 mg | ORAL_CAPSULE | Freq: Once | ORAL | Status: AC
  Administered 2015-12-08: 300 mg via ORAL
  Filled 2015-12-08: qty 2

## 2015-12-08 MED ORDER — CLINDAMYCIN HCL 150 MG PO CAPS: 450.0000 mg | ORAL_CAPSULE | Freq: Three times a day (TID) | ORAL | Status: DC

## 2015-12-08 MED ORDER — HYDROCODONE-ACETAMINOPHEN 5-325 MG PO TABS: 1.0000 | ORAL_TABLET | ORAL | Status: DC | PRN

## 2015-12-08 NOTE — ED Notes (Addendum)
Pt c/o a knot on the left side of her neck/jaw area x 2 weeks. Pt c/o stabbing pain when she turns her head.

## 2015-12-08 NOTE — ED Provider Notes (Signed)
CSN: [id number]     Arrival date & time 12/08/15  1811 History  By signing my name below, I, [facility], attest that this documentation has been prepared under the direction and in the presence of Evalee Jefferson, PA-C. Electronically Signed: Virgel Bouquet, ED Scribe. 12/08/2015. 8:46 PM.    Chief Complaint  Patient presents with  . knot on neck    The history is provided by the patient. No language interpreter was used.   HPI Comments: Denise Barnett is a 44 y.o. female with hx of DM who presents to the Emergency Department complaining of mildly painful, gradually worsening lump to the left side of her neck near the jaw onset 2 weeks. Patient reports no recent infections. She notes that the swelling has worsened to cause stabbing pain with lateral neck rotation. Pain is worse when lying on left side. Per patient, she has taken Tylenol and 3x ibuprofen-200 mg without relief. Patient notes that she is supposed to be taking methotrexate for history of lichen planus but has not taken this medication for 2 months due to insurance issues. She reports this condition is under control at present.  She has an appointment next month with her PCP. Allergic to penicillin and bactrim. She denies dental pain, swelling to mouth, tongue or gingiva.  PCP: [facility]  Past Medical History  Diagnosis Date  . Asthma   . Bronchitis   . Pneumonia   . OSA (obstructive sleep apnea) 12/25/2013  . Lichen planus   . Diabetes mellitus without complication [facility])    Past Surgical History  Procedure Laterality Date  . None     Family History  Problem Relation Age of Onset  . Sleep apnea Mother   . Heart attack Brother    Social History  Substance Use Topics  . Smoking status: Former Research scientist (life sciences)  . Smokeless tobacco: Never Used  . Alcohol Use: No   OB History    No data available     Review of Systems  HENT: Positive for facial swelling (lump on left side of neck near jaw). Negative for  dental problem (dental pain) and ear pain.   Musculoskeletal: Positive for neck pain. Negative for joint swelling.      Allergies  Bactrim and Penicillins  Home Medications   Prior to Admission medications   Medication Sig Start Date End Date Taking? Authorizing Provider  clindamycin (CLEOCIN) 150 MG capsule Take 3 capsules (450 mg total) by mouth 3 (three) times daily. 12/08/15   Evalee Jefferson, PA-C  diphenhydrAMINE (BENADRYL) 25 MG tablet Take 2 tablets (50 mg total) by mouth every 4 (four) hours as needed for itching. 05/30/15   Ripley Fraise, MD  EPINEPHrine (EPIPEN 2-PAK) 0.3 mg/0.3 mL IJ SOAJ injection Inject 0.3 mLs (0.3 mg total) into the muscle once. 05/30/15   Ripley Fraise, MD  HYDROcodone-acetaminophen (NORCO/VICODIN) 5-325 MG tablet Take 1 tablet by mouth every 4 (four) hours as needed. 12/08/15   Evalee Jefferson, PA-C  ibuprofen (ADVIL,MOTRIN) 200 MG tablet Take 400 mg by mouth daily as needed. For pain    Historical Provider, MD  lidocaine (XYLOCAINE) 2 % jelly 2 application by Other route as needed. 11/18/13   Historical Provider, MD  Norgestimate-Ethinyl Estradiol Triphasic 0.18/0.215/0.25 MG-35 MCG tablet Take 1 tablet by mouth daily. 09/23/13   Historical Provider, MD  predniSONE (DELTASONE) 50 MG tablet One tablet PO daily for 4 days 05/30/15   Ripley Fraise, MD  terbinafine (LAMISIL AT) 1 % cream Apply 1 application  topically 2 (two) times daily. 05/18/15   Clayton Bibles, PA-C  traMADol (ULTRAM) 50 MG tablet Take 1 tablet (50 mg total) by mouth every 6 (six) hours as needed for moderate pain or severe pain. 05/18/15   Clayton Bibles, PA-C   BP 150/73 mmHg  Pulse 83  Temp(Src) 98 F (36.7 C) (Oral)  Resp 17  Ht 5\' 2"  (1.575 m)  Wt 105.688 kg  BMI 42.61 kg/m2  SpO2 99%  LMP 11/29/2015 Physical Exam  Constitutional: She is oriented to person, place, and time. She appears well-developed and well-nourished. No distress.  HENT:  Head: Normocephalic and atraumatic.  Right Ear:  Tympanic membrane, external ear and ear canal normal.  Left Ear: Tympanic membrane, external ear and ear canal normal.  Tenderness and hypertrophy to the left submandibular lymph node. No facial erythema or edema. Dentition appears healthy without abscess or dental decay. No trismus. Neck is supple. Sublingual space is soft.  Eyes: Conjunctivae and EOM are normal.  Neck: Neck supple. No tracheal deviation present.  Cardiovascular: Normal rate.   Pulmonary/Chest: Effort normal. No respiratory distress.  Musculoskeletal: Normal range of motion.  Neurological: She is alert and oriented to person, place, and time.  Skin: Skin is warm and dry.  Psychiatric: She has a normal mood and affect. Her behavior is normal.  Nursing note and vitals reviewed.   ED Course  Procedures  DIAGNOSTIC STUDIES: Oxygen Saturation is 99% on RA, normal by my interpretation.    COORDINATION OF CARE: 8:32 PM Advised to apply warm compresses, avoid rubbing site. Advised to follow-up with PCP as scheduled in 2 weeks for recheck. Will prescribe antibiotics, anti inflammatories. . Discussed treatment plan with pt at bedside and pt agreed to plan.  Labs Review Labs Reviewed - No data to display  Imaging Review No results found. I have personally reviewed and evaluated these images and lab results as part of my medical decision-making.   EKG Interpretation None      MDM   Final diagnoses:  Lymphadenitis, acute    I personally performed the services described in this documentation, which was scribed in my presence. The recorded information has been reviewed and is accurate.   Evalee Jefferson, PA-C 12/09/15 1413  Ripley Fraise, MD 12/09/15 [phone]

## 2015-12-08 NOTE — ED Notes (Signed)
Instructed pt to take all of antibiotics as prescribed.Pt verbalized understanding of no driving and to use caution within 4 hours of taking pain meds due to meds cause drowsiness 

## 2015-12-08 NOTE — Discharge Instructions (Signed)

## 2015-12-09 ENCOUNTER — Telehealth: Payer: Self-pay | Admitting: *Deleted

## 2015-12-23 ENCOUNTER — Other Ambulatory Visit: Payer: Self-pay | Admitting: Family Medicine

## 2015-12-23 DIAGNOSIS — R609 Edema, unspecified: Secondary | ICD-10-CM

## 2015-12-23 DIAGNOSIS — K118 Other diseases of salivary glands: Secondary | ICD-10-CM

## 2015-12-23 DIAGNOSIS — R52 Pain, unspecified: Secondary | ICD-10-CM

## 2015-12-24 ENCOUNTER — Telehealth: Payer: Self-pay

## 2015-12-24 NOTE — Telephone Encounter (Signed)
LM for patient to remind her of appt tomorrow and asked her to bring in CPAP or SD card in machine so we can get recent download.

## 2015-12-25 ENCOUNTER — Ambulatory Visit (INDEPENDENT_AMBULATORY_CARE_PROVIDER_SITE_OTHER): Payer: BLUE CROSS/BLUE SHIELD | Admitting: Neurology

## 2015-12-25 ENCOUNTER — Encounter: Payer: Self-pay | Admitting: Neurology

## 2015-12-25 VITALS — BP 112/78 | HR 78 | Resp 18 | Ht 64.0 in | Wt 220.0 lb

## 2015-12-25 DIAGNOSIS — G4733 Obstructive sleep apnea (adult) (pediatric): Secondary | ICD-10-CM

## 2015-12-25 DIAGNOSIS — E669 Obesity, unspecified: Secondary | ICD-10-CM | POA: Diagnosis not present

## 2015-12-25 DIAGNOSIS — Z9989 Dependence on other enabling machines and devices: Principal | ICD-10-CM

## 2015-12-25 NOTE — Patient Instructions (Signed)
Please continue using your CPAP regularly. While your insurance requires that you use CPAP at least 4 hours each night on 70% of the nights, I recommend, that you not skip any nights and use it throughout the night if you can. Getting used to CPAP and staying with the treatment long term does take time and patience and discipline. Untreated obstructive sleep apnea when it is moderate to severe can have an adverse impact on cardiovascular health and raise her risk for heart disease, arrhythmias, hypertension, congestive heart failure, stroke and diabetes. Untreated obstructive sleep apnea causes sleep disruption, nonrestorative sleep, and sleep deprivation. This can have an impact on your day to day functioning and cause daytime sleepiness and impairment of cognitive function, memory loss, mood disturbance, and problems focussing. Using CPAP regularly can improve these symptoms.  Keep up the good work! I will see you back in a year for sleep apnea follow up.

## 2015-12-25 NOTE — Progress Notes (Signed)
Subjective:    Patient ID: Denise Barnett is a 45 y.o. female.  HPI     Interim history:   Denise Barnett is a 45 year old right-handed woman with any underlying medical history of seasonal allergies, vertigo, recurrent sinusitis, and morbid obesity, who presents for followup consultation of her obstructive sleep apnea, on CPAP therapy. The patient is unaccompanied today. Of note, she canceled an appointment for 10/17/2014. I last saw her on 04/16/2014, at which time she reported doing well with CPAP. She felt better rested and no longer snored. She was fully compliant with treatment.   Today, 12/25/2015: I reviewed her CPAP compliance data from 11/25/2015 through 12/24/2015, which is a total of 30 days during which time she used her machine every night with percent used days greater than 4 hours of 97%, indicating excellent compliance with an average usage of 7 hours and 52 minutes, residual AHI low at 0.4 per hour, leak low with the 95th percentile at 4.1 L/m on a pressure of 8 cm with EPR of 3.  Today, 12/25/2015: She reports doing well, has lost some weight, unfortunately had a lapse in her insurance. She is compliant with treatment. She has Lichen Planus and may start a biologic. She sees dermatology for this, in addition, she has psoriasis and eczema. She changed jobs and now works for [employer]. Her eczema has flared up. She is no longer on methotrexate. She is on metformin and her latest A1c was good she states, around 5.8, as she recalls. She switched to a new PCP.  Previously:   She canceled an appointment for 04/11/14. I first met her on 12/25/2013 at the request of her primary care provider, at which time the patient reported loud snoring, a family history of sleep apnea and complains of excessive daytime somnolence. I suggested she return for a sleep study. She had baseline sleep study on 01/04/2014, followed by a CPAP titration study on 02/05/2014. I went over all her test results in  detail today. Her baseline sleep study from 01/04/2014 showed a sleep efficiency of 81.6% with a prolonged sleep latency of 48 minutes and wake after sleep onset of 36 minutes with moderate sleep fragmentation noted. She had an elevated arousal index of 39.4 per hour. She had an elevation in light stage sleep, mildly decreased percentage of slow-wave sleep, and a mildly decreased percentage of REM sleep with a prolonged REM latency of 169 minutes. She had no significant periodic leg movements of sleep and no significant EKG changes. She had mild to moderate and sometimes loud snoring. She had a total AHI of 5.5 per hour, rising to 11.5 per hour in the supine position. Baseline oxygen saturation was 94% with a nadir of 88% in REM sleep. Based on her sleep related complaints I felt that she would be a candidate for treatment for sleep apnea and I felt the main 2 options would be CPAP versus an oral appliance. She elected to come back for a CPAP titration study which she had on 02/05/2014: Sleep efficiency was 74.8%. She had a prolonged sleep latency of 41 minutes and a wake after sleep onset of 65 minutes with moderate sleep fragmentation noted. She had an increased percentage of stage I sleep, a normal percentage of stage II sleep, a normal percentage of slow-wave sleep, and a reduced percentage of REM sleep with a prolonged REM latency. She had no significant periodic leg movements of sleep and no significant EKG changes. She was titrated on CPAP from  5-9 cm with elimination of her sleep disordered breathing with a pressure of 8 cm. She did indicate that she felt she slept better than usual. Based on the test results I placed her on CPAP therapy at home. I reviewed her compliance data from 02/23/2014 to 03/24/2014, which is a total of 30 days, during which time the patient used CPAP every day. The average usage for all days was 7 hours and 59 minutes. The percent used days greater than 4 hours was 100 %, indicating  superb compliance. The residual AHI was 0.9 per hour, indicating an appropriate treatment pressure of 8 cwp with EPR of 3. Air leak from the mask was low at 5.9 L per minute at the 95th percentile.  I reviewed her compliance data from 03/17/2014 through 04/15/2014 which is a total of 30 days during which time she uses CPAP every night, with an average usage of 8 hours and 51 minutes. Percent used days greater than 4 hours was 100%, indicating superb compliance with a residual AHI of 0.8 per hour and very low leak.    Her Past Medical History Is Significant For: Past Medical History  Diagnosis Date  . Asthma   . Bronchitis   . Pneumonia   . OSA (obstructive sleep apnea) 12/25/2013  . Lichen planus   . Diabetes mellitus without complication [facility])     Her Past Surgical History Is Significant For: Past Surgical History  Procedure Laterality Date  . None      Her Family History Is Significant For: Family History  Problem Relation Age of Onset  . Sleep apnea Mother   . Heart attack Brother     Her Social History Is Significant For: Social History   Social History  . Marital Status: Single    Spouse Name: N/A  . Number of Children: 1  . Years of Education: 12   Occupational History  . [employer]    Social History Main Topics  . Smoking status: Former Research scientist (life sciences)  . Smokeless tobacco: Never Used  . Alcohol Use: No  . Drug Use: No  . Sexual Activity: Yes   Other Topics Concern  . None   Social History Narrative    Her Allergies Are:  Allergies  Allergen Reactions  . Bactrim [Sulfamethoxazole-Trimethoprim] Rash  . Penicillins Rash  :   Her Current Medications Are:  Outpatient Encounter Prescriptions as of 12/25/2015  Medication Sig  . EPINEPHrine (EPIPEN 2-PAK) 0.3 mg/0.3 mL IJ SOAJ injection Inject 0.3 mLs (0.3 mg total) into the muscle once.  [name] ibuprofen (ADVIL,MOTRIN) 200 MG tablet Take 400 mg by mouth daily as needed. For pain  . lidocaine (XYLOCAINE) 2  % jelly 2 application by Other route as needed.  . metFORMIN (GLUCOPHAGE) 500 MG tablet Take 500 mg by mouth 2 (two) times daily with a meal.   . Norgestimate-Ethinyl Estradiol Triphasic 0.18/0.215/0.25 MG-35 MCG tablet Take 1 tablet by mouth daily.  . [DISCONTINUED] clindamycin (CLEOCIN) 150 MG capsule Take 3 capsules (450 mg total) by mouth 3 (three) times daily.  . [DISCONTINUED] diphenhydrAMINE (BENADRYL) 25 MG tablet Take 2 tablets (50 mg total) by mouth every 4 (four) hours as needed for itching.  . [DISCONTINUED] HYDROcodone-acetaminophen (NORCO/VICODIN) 5-325 MG tablet Take 1 tablet by mouth every 4 (four) hours as needed.  . [DISCONTINUED] predniSONE (DELTASONE) 50 MG tablet One tablet PO daily for 4 days  . [DISCONTINUED] terbinafine (LAMISIL AT) 1 % cream Apply 1 application topically 2 (two) times daily.  . [  DISCONTINUED] traMADol (ULTRAM) 50 MG tablet Take 1 tablet (50 mg total) by mouth every 6 (six) hours as needed for moderate pain or severe pain.   No facility-administered encounter medications on file as of 12/25/2015.  :  Review of Systems:  Out of a complete 14 point review of systems, all are reviewed and negative with the exception of these symptoms as listed below:   Review of Systems  Neurological:       Patient is here for CPAP f/u. Has new insurance and needs new supplies.     Objective:  Neurologic Exam  Physical Exam Physical Examination:   Filed Vitals:   12/25/15 1314  BP: 112/78  Pulse: 78  Resp: 18   General Examination: The patient is a very pleasant 45 y.o. female in no acute distress. She appears well-developed and well-nourished and adequately groomed. She is obese. She is in good spirits today.  HEENT: Normocephalic, atraumatic, pupils are equal, round and reactive to light and accommodation. Funduscopic exam is normal with sharp disc margins noted. She has corrective eyeglasses. Extraocular tracking is good without limitation to gaze excursion  or nystagmus noted. Normal smooth pursuit is noted. Hearing is grossly intact. Face is symmetric with normal facial animation and normal facial sensation. Speech is clear with no dysarthria noted. There is no hypophonia. There is no lip, neck/head, jaw or voice tremor. Neck is supple with full range of passive and active motion. There are no carotid bruits on auscultation. Oropharynx exam reveals: mild mouth dryness, adequate dental hygiene and mild airway crowding, due to narrow airway entry and elongated tongue. Mallampati is class II. Tongue protrudes centrally and palate elevates symmetrically. Tonsils are 1+, L a little larger than R. She has a tiny overbite. No sores are seen at her nostrils.  Chest: Clear to auscultation without wheezing, rhonchi or crackles noted.  Heart: S1+S2+0, regular and normal without murmurs, rubs or gallops noted.   Abdomen: Soft, non-tender and non-distended with normal bowel sounds appreciated on auscultation.  Extremities: There is no pitting edema in the distal lower extremities bilaterally. Pedal pulses are intact.  Skin: Warm and dry without trophic changes noted. There are no varicose veins. She has eczema over her dorsal aspects of her hands, left more than right and psoriatic tiny patches on her earlobes and behind her ears.  Musculoskeletal: exam reveals no obvious joint deformities, tenderness or joint swelling or erythema.   Neurologically:  Mental status: The patient is awake, alert and oriented in all 4 spheres. Her memory, attention, language and knowledge are appropriate. There is no aphasia, agnosia, apraxia or anomia. Speech is clear with normal prosody and enunciation. Thought process is linear. Mood is congruent and affect is normal.  Cranial nerves are as described above under HEENT exam. In addition, shoulder shrug is normal with equal shoulder height noted. Motor exam: Normal bulk, strength and tone is noted. There is no drift, tremor or  rebound. Romberg is negative. Reflexes are 2+ throughout. Fine motor skills are intact with normal finger taps, normal hand movements, normal rapid alternating patting, normal foot taps and normal foot agility.  Cerebellar testing shows no dysmetria or intention tremor on finger to nose testing. Heel to shin is unremarkable bilaterally. There is no truncal or gait ataxia.  Sensory exam is intact to light touch in the upper and lower extremities.  Gait, station and balance are unremarkable. No veering to one side is noted. No leaning to one side is noted. Posture is age-appropriate  and stance is narrow based. No problems turning are noted. She turns en bloc. Tandem walk is unremarkable.              Assessment and Plan:   In summary, Ahmia Jerilynn Mages Mcmaster is a very pleasant 45 year old female with any underlying medical history of seasonal allergies, vertigo, atopic dermatitis, lichen planus, psoriasis, obesity, and recurrent sinusitis, who presents for follow-up consultation of her obstructive sleep apnea, on treatment with CPAP at 8 cm. She has been able to lose a little bit of weight. She is compliant with treatment. Her physical exam is stable. She has had some flare up with her eczema and psoriasis and may start on a biologic treatment. She stopped methotrexate. She has ongoing good results with CPAP therapy. She is encouraged to continue with treatment. Her neurological exam is stable. She is encouraged to continue to try to lose weight. I reviewed her most recent compliance data with her and she is commended for being adherent to treatment. I will see her back routinely in one year, sooner if needed. She needs new supplies and I placed an order for this. Her DME company is [facility].  I answered all her questions today and the patient was in agreement with the plan. I encouraged her to call with any interim questions, concerns, problems or updates. I spent 15 minutes in total face-to-face time  with the patient, more than 50% of which was spent in counseling and coordination of care, reviewing test results, reviewing medication and discussing or reviewing the diagnosis of OSA, its prognosis and treatment options.

## 2015-12-26 ENCOUNTER — Ambulatory Visit
Admission: RE | Admit: 2015-12-26 | Discharge: 2015-12-26 | Disposition: A | Discharge status: Home / Self Care | Payer: BLUE CROSS/BLUE SHIELD | Source: Ambulatory Visit | Attending: Family Medicine | Admitting: Family Medicine

## 2015-12-26 DIAGNOSIS — K118 Other diseases of salivary glands: Secondary | ICD-10-CM

## 2015-12-26 DIAGNOSIS — R609 Edema, unspecified: Secondary | ICD-10-CM

## 2015-12-26 DIAGNOSIS — R52 Pain, unspecified: Secondary | ICD-10-CM

## 2015-12-26 IMAGING — US US SOFT TISSUE HEAD/NECK
1 series · 13 of 25 positions shown · non-contrast
Comparison: None.

CLINICAL DATA: 44-year-old female with a history of left parotid
swelling

EXAM:
ULTRASOUND OF HEAD/NECK SOFT TISSUES
TECHNIQUE: Ultrasound examination of the head and neck soft tissues was
performed in the area of clinical concern.

[Series 1: us soft tissue head/neck · 0.07mm/px · 13 of 27 slices shown]
[im 1/27]
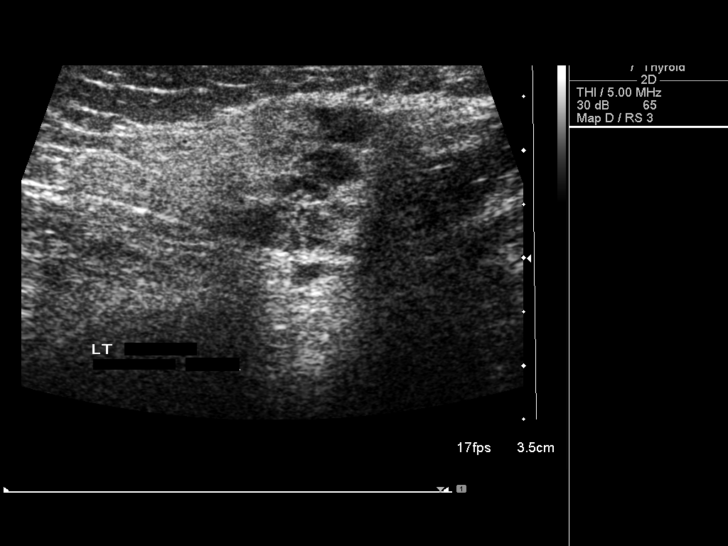
[im 3/27]
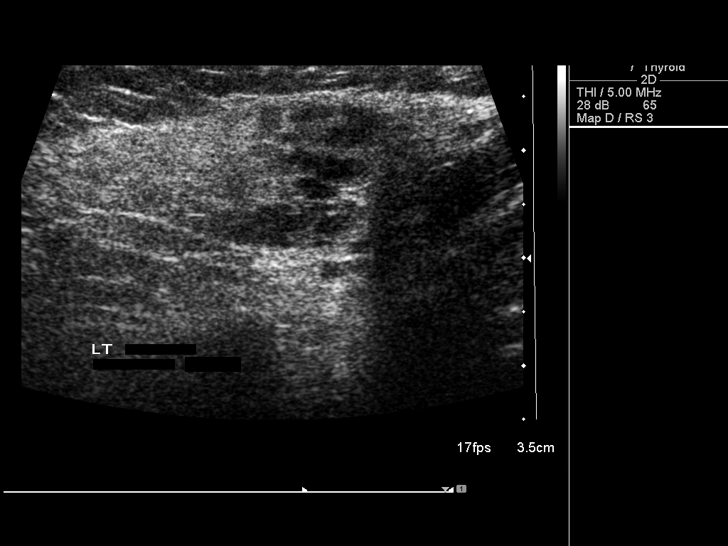
[im 5/27]
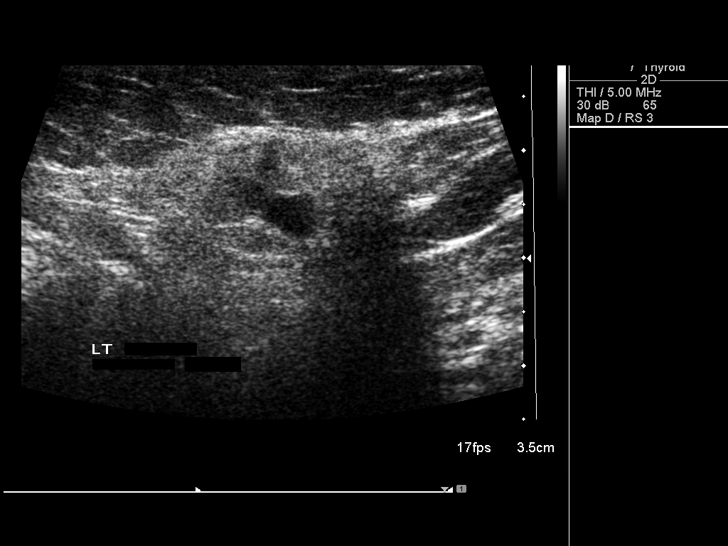
[im 7/27]
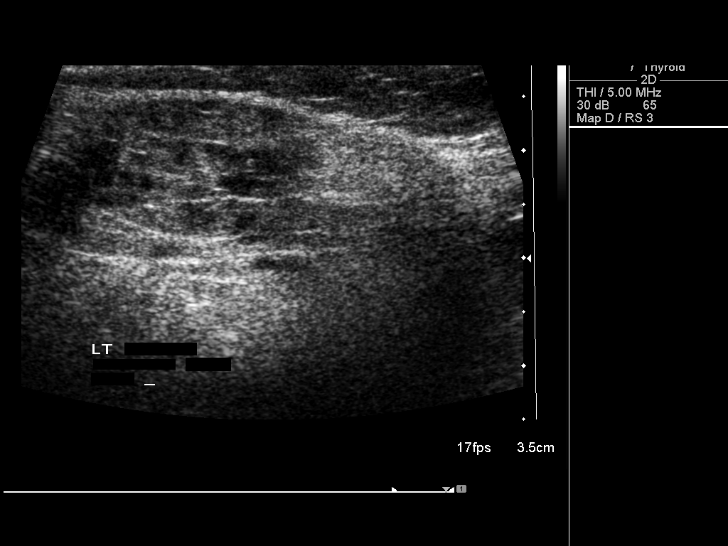
[im 9/27]
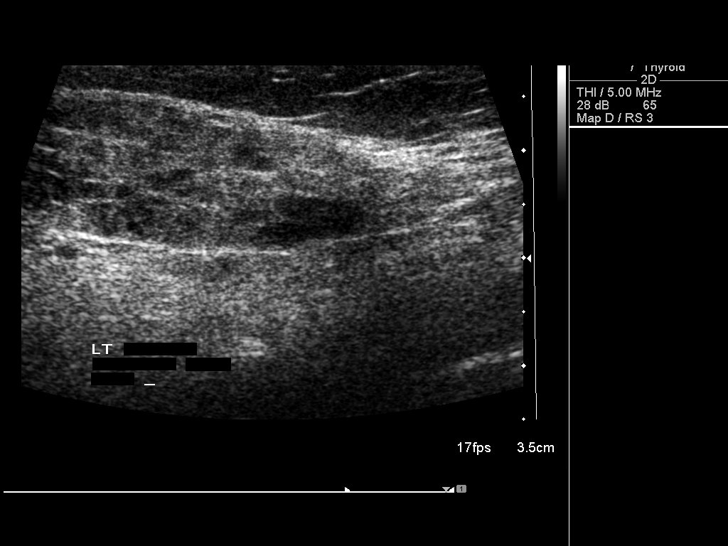
[im 11/27]
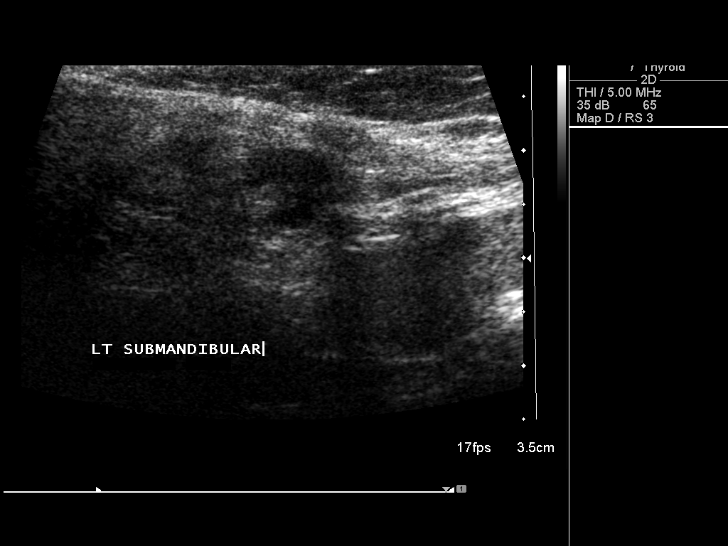
[im 14/27]
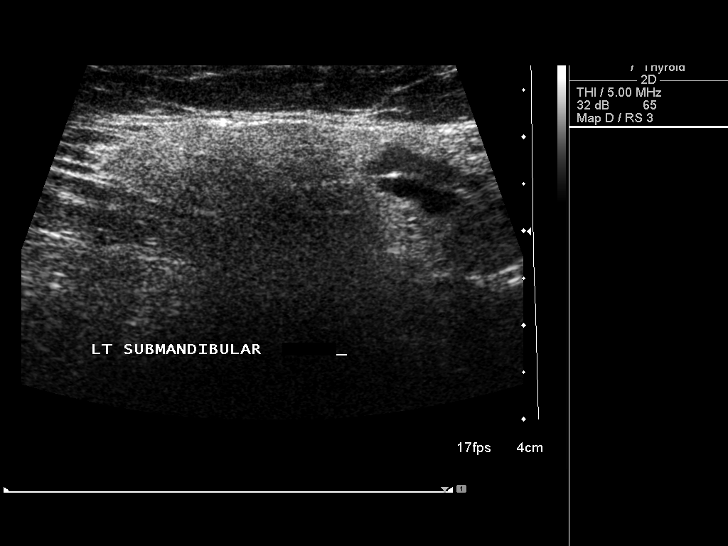
[im 16/27]
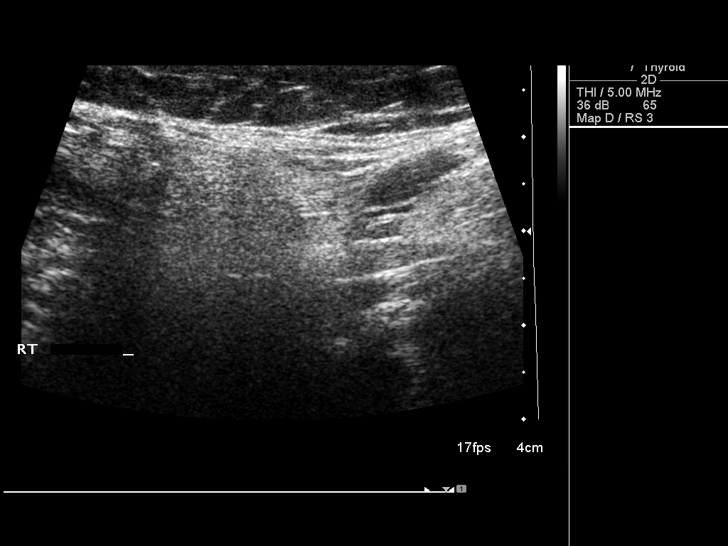
[im 18/27]
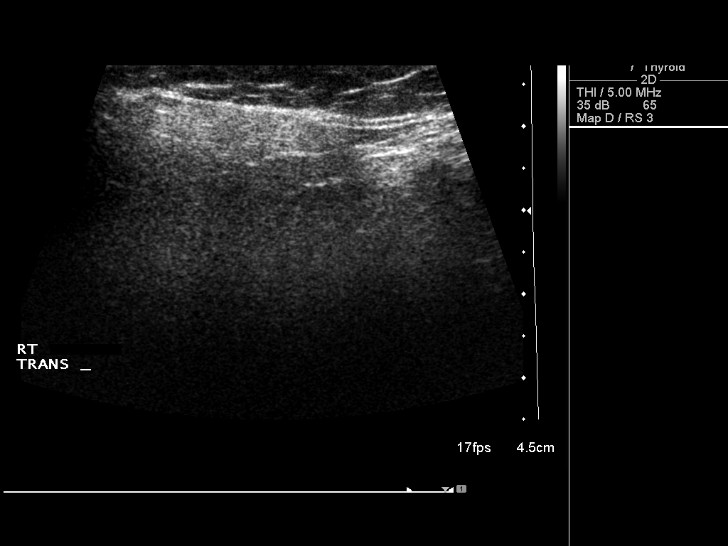
[im 20/27]
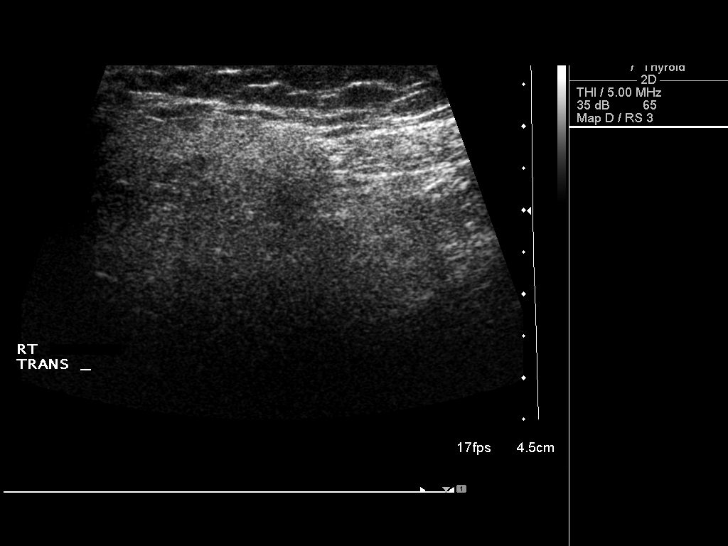
[im 22/27]
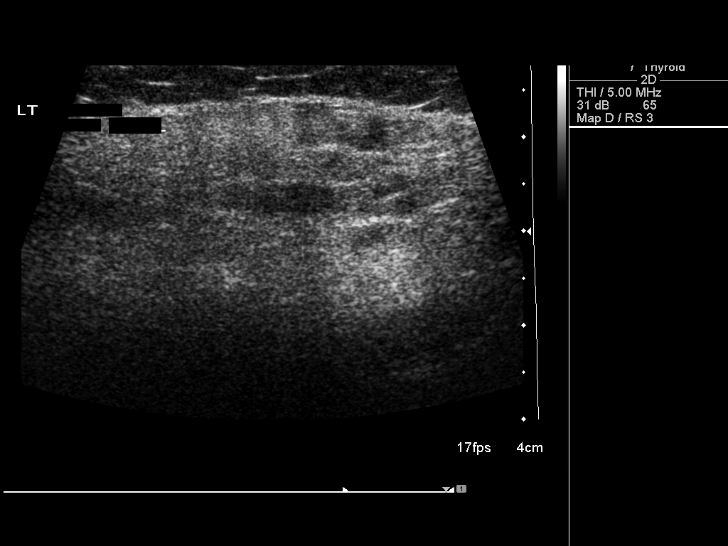
[im 24/27]
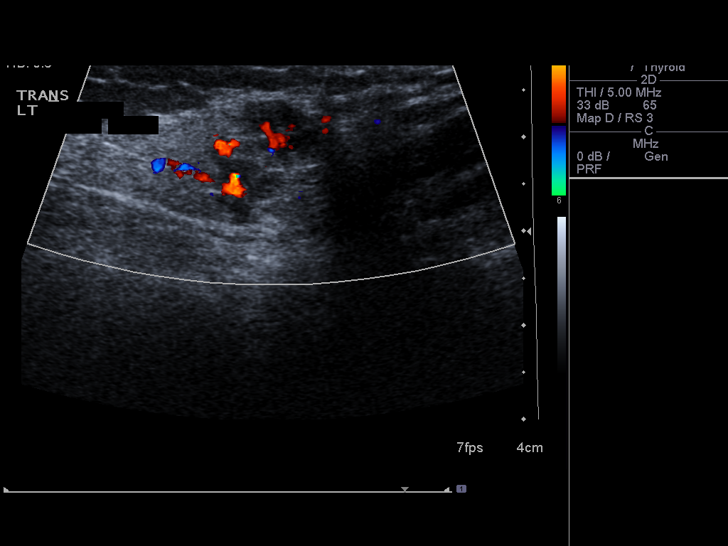
[im 27/27]
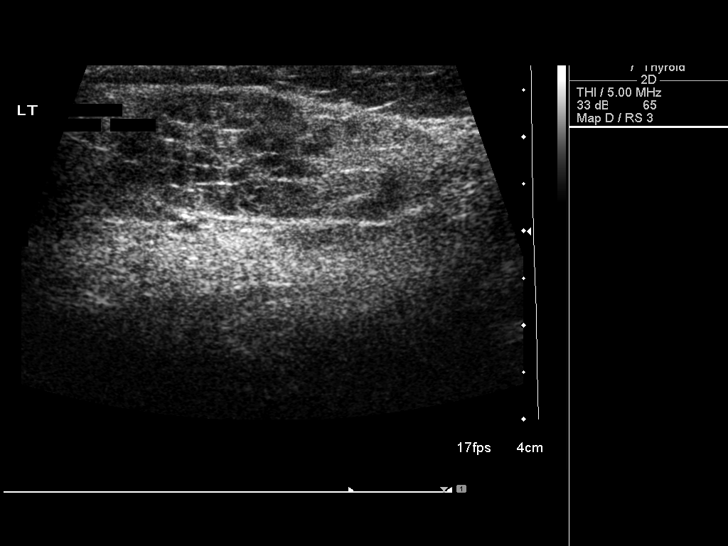

[13 of 25 positions shown; findings below may reference images not displayed]

FINDINGS: Grayscale and color duplex imaging performed in the region of
clinical concern.

Left parotid gland appears relatively heterogeneous, with focal
hypoechoic regions, potentially focal edema or nodules/lymph nodes.
No calcifications identified.

Submandibular region imaged with a focal hypoechoic lymph node
measuring 6 mm in greatest short axis dimension.

Relatively homogeneous appearance of the right parotid gland.
IMPRESSION: Sonographic survey in the region of clinical concern demonstrates a
heterogeneous left parotid gland with internal hypoechoic foci
representing either small nodules/ cysts or intra parotid lymph
nodes. Findings are nonspecific, and could potentially represent
sialadenitis, however, infiltrative process or malignancy could also
have this appearance. At least a follow-up ultrasound is recommended
once the patient has been treated for this acute process to assure
resolution, or alternatively contrast-enhanced neck CT.

## 2017-01-06 ENCOUNTER — Telehealth: Payer: Self-pay | Admitting: Neurology

## 2017-01-06 NOTE — Telephone Encounter (Signed)
Order received. I will have Dr. Rexene Alberts to sign and I will fax in. Patient has f/u appt in February.

## 2017-01-06 NOTE — Telephone Encounter (Signed)
Pt called said [facility] needs RX for head gear, nasal piece, container that holds the water inside the machine. Pt does not know the name of the machine or part numbers. She said [facility] will be faxing a request. Pt has an appt 2/28 with Dr Rexene Alberts

## 2017-02-08 ENCOUNTER — Telehealth: Payer: Self-pay

## 2017-02-08 NOTE — Telephone Encounter (Signed)
I called pt to remind her to bring her cpap chip/cpap to the appt tomorrow with Dr. Rexene Alberts. No answer, left a message asking her to call me back. If pt calls back, please advise her of this information.

## 2017-02-08 NOTE — Telephone Encounter (Signed)
Patient called office to confirm if she has to bring her CPAP machine patient states she hasn't had to bring her machine in the past.  Please call to confirm- per patient please leave on her voice mail if she does not answer.

## 2017-02-08 NOTE — Telephone Encounter (Signed)
I called pt back, left a message advising her that the cpap chip is sufficient.

## 2017-02-09 ENCOUNTER — Ambulatory Visit (INDEPENDENT_AMBULATORY_CARE_PROVIDER_SITE_OTHER): Payer: Managed Care, Other (non HMO) | Admitting: Neurology

## 2017-02-09 ENCOUNTER — Encounter: Payer: Self-pay | Admitting: Neurology

## 2017-02-09 VITALS — BP 128/80 | HR 76 | Resp 20 | Ht 64.0 in | Wt 219.0 lb

## 2017-02-09 DIAGNOSIS — G4733 Obstructive sleep apnea (adult) (pediatric): Secondary | ICD-10-CM

## 2017-02-09 DIAGNOSIS — Z9989 Dependence on other enabling machines and devices: Secondary | ICD-10-CM

## 2017-02-09 NOTE — Progress Notes (Signed)
Subjective:    Patient ID: Denise Barnett is a 46 y.o. female.  HPI     Interim history:   Denise Barnett is a 46 year old right-handed woman with any underlying medical history of seasonal allergies, vertigo, recurrent sinusitis, and morbid obesity, who presents for followup consultation of her obstructive sleep apnea, on CPAP therapy. The patient is unaccompanied today. I last saw her on 12/25/2015, at which time she reported doing well, she had lost some weight. She was compliant with CPAP therapy. She was still benefiting from treatment. She was supposed to start a new medication through dermatology for her lichen planus. She had a flareup in her eczema. She was no longer on methotrexate. She started seeing a new PCP.  Today, 02/09/2017 (all dictated new, as well as above notes, some dictation done in note pad or Word, outside of chart, may appear as copied):  I reviewed her CPAP compliance data from 01/10/2017 through 02/08/2017, which is a total of 30 days, during which time she used her CPAP every night with percent used days greater than 4 hours at 97%, indicating excellent compliance with an average usage of 8 hours and 23 minutes. Residual AHI low at 0.3 per hour, leak low with the 95th percentile at 4.9 L/m on pressure of 8 cm with EPR of 3. She reports doing well with CPAP, she has no new complaints with regards to her sleep. She feels well rested and is fully compliant with treatment. She does need new supplies. Her eczema has flared up but it tends to do that during the wintertime. She reports that she is on Humira every other week for her lichen planus and it has been about 6 months, she had a recent TB test, she believes her lichen planus has improved.  The patient's allergies, current medications, family history, past medical history, past social history, past surgical history and problem list were reviewed and updated as appropriate.   Previously (copied from previous notes for  reference):   Of note, she canceled an appointment for 10/17/2014. I saw her on 04/16/2014, at which time she reported doing well with CPAP. She felt better rested and no longer snored. She was fully compliant with treatment.    I reviewed her CPAP compliance data from 11/25/2015 through 12/24/2015, which is a total of 30 days during which time she used her machine every night with percent used days greater than 4 hours of 97%, indicating excellent compliance with an average usage of 7 hours and 52 minutes, residual AHI low at 0.4 per hour, leak low with the 95th percentile at 4.1 L/m on a pressure of 8 cm with EPR of 3.   She canceled an appointment for 04/11/14. I first met her on 12/25/2013 at the request of her primary care provider, at which time the patient reported loud snoring, a family history of sleep apnea and complains of excessive daytime somnolence. I suggested she return for a sleep study. She had baseline sleep study on 01/04/2014, followed by a CPAP titration study on 02/05/2014. I went over all her test results in detail today. Her baseline sleep study from 01/04/2014 showed a sleep efficiency of 81.6% with a prolonged sleep latency of 48 minutes and wake after sleep onset of 36 minutes with moderate sleep fragmentation noted. She had an elevated arousal index of 39.4 per hour. She had an elevation in light stage sleep, mildly decreased percentage of slow-wave sleep, and a mildly decreased percentage of REM sleep with a  prolonged REM latency of 169 minutes. She had no significant periodic leg movements of sleep and no significant EKG changes. She had mild to moderate and sometimes loud snoring. She had a total AHI of 5.5 per hour, rising to 11.5 per hour in the supine position. Baseline oxygen saturation was 94% with a nadir of 88% in REM sleep. Based on her sleep related complaints I felt that she would be a candidate for treatment for sleep apnea and I felt the main 2 options would be CPAP  versus an oral appliance. She elected to come back for a CPAP titration study which she had on 02/05/2014: Sleep efficiency was 74.8%. She had a prolonged sleep latency of 41 minutes and a wake after sleep onset of 65 minutes with moderate sleep fragmentation noted. She had an increased percentage of stage I sleep, a normal percentage of stage II sleep, a normal percentage of slow-wave sleep, and a reduced percentage of REM sleep with a prolonged REM latency. She had no significant periodic leg movements of sleep and no significant EKG changes. She was titrated on CPAP from 5-9 cm with elimination of her sleep disordered breathing with a pressure of 8 cm. She did indicate that she felt she slept better than usual. Based on the test results I placed her on CPAP therapy at home. I reviewed her compliance data from 02/23/2014 to 03/24/2014, which is a total of 30 days, during which time the patient used CPAP every day. The average usage for all days was 7 hours and 59 minutes. The percent used days greater than 4 hours was 100 %, indicating superb compliance. The residual AHI was 0.9 per hour, indicating an appropriate treatment pressure of 8 cwp with EPR of 3. Air leak from the mask was low at 5.9 L per minute at the 95th percentile.   I reviewed her compliance data from 03/17/2014 through 04/15/2014 which is a total of 30 days during which time she uses CPAP every night, with an average usage of 8 hours and 51 minutes. Percent used days greater than 4 hours was 100%, indicating superb compliance with a residual AHI of 0.8 per hour and very low leak.   Her Past Medical History Is Significant For: Past Medical History:  Diagnosis Date  . Asthma   . Bronchitis   . Diabetes mellitus without complication ([place])   . Lichen planus   . OSA (obstructive sleep apnea) 12/25/2013  . Pneumonia     Her Past Surgical History Is Significant For: Past Surgical History:  Procedure Laterality Date  . none      Her  Family History Is Significant For: Family History  Problem Relation Age of Onset  . Sleep apnea Mother   . Heart attack Brother     Her Social History Is Significant For: Social History   Social History  . Marital status: Single    Spouse name: N/A  . Number of children: 1  . Years of education: 72   Occupational History  . [employer]   Social History Main Topics  . Smoking status: Former Research scientist (life sciences)  . Smokeless tobacco: Never Used  . Alcohol use No  . Drug use: No  . Sexual activity: Yes   Other Topics Concern  . Not on file   Social History Narrative  . No narrative on file    Her Allergies Are:  Allergies  Allergen Reactions  . Bactrim [Sulfamethoxazole-Trimethoprim] Rash  . Penicillins Rash  :  Her Current Medications Are:  Outpatient Encounter Prescriptions as of 02/09/2017  Medication Sig  . HUMIRA PEN 40 MG/0.8ML PNKT   . ibuprofen (ADVIL,MOTRIN) 200 MG tablet Take 400 mg by mouth daily as needed. For pain  . metFORMIN (GLUCOPHAGE) 500 MG tablet Take 500 mg by mouth 2 (two) times daily with a meal.   . Norgestimate-Ethinyl Estradiol Triphasic 0.18/0.215/0.25 MG-35 MCG tablet Take 1 tablet by mouth daily.  . simvastatin (ZOCOR) 20 MG tablet Take 20 mg by mouth daily.  . [DISCONTINUED] EPINEPHrine (EPIPEN 2-PAK) 0.3 mg/0.3 mL IJ SOAJ injection Inject 0.3 mLs (0.3 mg total) into the muscle once.  . [DISCONTINUED] lidocaine (XYLOCAINE) 2 % jelly 2 application by Other route as needed.   No facility-administered encounter medications on file as of 02/09/2017.   :  Review of Systems:  Out of a complete 14 point review of systems, all are reviewed and negative with the exception of these symptoms as listed below:  Review of Systems  Neurological:       Pt is doing well on cpap but needs a new prescription for supplies.    Objective:  Neurologic Exam  Physical Exam Physical Examination:   Vitals:   02/09/17 1032  BP: 128/80   Pulse: 76  Resp: 20   General Examination: The patient is a very pleasant 46 y.o. female in no acute distress. She appears well-developed and well-nourished and adequately groomed. Good spirits today.   HEENT: Normocephalic, atraumatic, pupils are equal, round and reactive to light and accommodation. She is wearing corrective eyeglasses. Extraocular tracking is good without limitation to gaze excursion or nystagmus noted. Normal smooth pursuit is noted. Hearing is grossly intact. Face is symmetric with normal facial animation and normal facial sensation. Speech is clear with no dysarthria noted. There is no hypophonia. There is no lip, neck/head, jaw or voice tremor. Neck is supple with full range of passive and active motion. There are no carotid bruits on auscultation. Oropharynx exam reveals: mild mouth dryness, adequate dental hygiene and mild airway crowding. Mallampati is class II. Tongue protrudes centrally and palate elevates symmetrically. Tonsils are 1+.   Chest: Clear to auscultation without wheezing, rhonchi or crackles noted.  Heart: S1+S2+0, regular and normal without murmurs, rubs or gallops noted.   Abdomen: Soft, non-tender and non-distended with normal bowel sounds appreciated on auscultation.  Extremities: There is no pitting edema in the distal lower extremities bilaterally. Pedal pulses are intact.  Skin: Warm and dry with telltale changes to her hands and forearms from eczema. She is scratching.   Musculoskeletal: exam reveals no obvious joint deformities, tenderness or joint swelling or erythema.   Neurologically:  Mental status: The patient is awake, alert and oriented in all 4 spheres. Her immediate and remote memory, attention, language skills and fund of knowledge are appropriate. There is no evidence of aphasia, agnosia, apraxia or anomia. Speech is clear with normal prosody and enunciation. Thought process is linear. Mood is normal and affect is normal.  Cranial  nerves II - XII are as described above under HEENT exam. In addition: shoulder shrug is normal with equal shoulder height noted. Motor exam: Normal bulk, strength and tone is noted. There is no drift, tremor or rebound. Reflexes are 2+ throughout. Fine motor skills and coordination: intact.  Cerebellar testing: No dysmetria or intention tremor on finger to nose testing. There is no truncal or gait ataxia.  Sensory exam: intact to light touch in the upper and lower extremities.  Gait, station  and balance: She stands easily. No veering to one side is noted. No leaning to one side is noted. Posture is age-appropriate and stance is narrow based. Gait shows normal stride length and normal pace. No problems turning are noted.               Assessment and plan:  In summary, Denise Barnett is a very pleasant 46 y.o.-year old female with An underlying medical history of allergies, eczema, vertigo, lichen planus, psoriasis, obesity, recurrent sinusitis, who presents for follow-up consultation of her OSA. She is well established on CPAP therapy at 8 cm of water pressure. She has plateaued with her weight, has been around 220 pounds for the last couple of years. She has started Humira about 6 months ago. She's no longer on methotrexate. Exam is stable. She is compliant with treatment and we've reviewed the most recent compliance data, she is commended for her treatment adherence. She needs supplies and I entered an order for this. She is encouraged to continue to try to lose weight. She had sleep study testing in 2015. She sleeps well with CPAP and is motivated to continue with treatment. I suggested a one-year checkup, she can see one of our nurse practitioners at the time. I will see her back after that. I answered all her questions today and she was in agreement.  I spent 20 minutes in total face-to-face time with the patient, more than 50% of which was spent in counseling and coordination of care, reviewing test  results, reviewing medication and discussing or reviewing the diagnosis of OSA, its prognosis and treatment options. Pertinent laboratory and imaging test results that were available during this visit with the patient were reviewed by me and considered in my medical decision making (see chart for details).

## 2017-02-09 NOTE — Patient Instructions (Signed)

## 2017-03-11 ENCOUNTER — Encounter (HOSPITAL_COMMUNITY): Payer: Self-pay

## 2017-03-11 ENCOUNTER — Emergency Department (HOSPITAL_COMMUNITY): Payer: Managed Care, Other (non HMO)

## 2017-03-11 ENCOUNTER — Emergency Department (HOSPITAL_COMMUNITY)
Admission: EM | Admit: 2017-03-11 | Discharge: 2017-03-11 | Disposition: A | Discharge status: Home / Self Care | Payer: Managed Care, Other (non HMO) | Attending: Emergency Medicine | Admitting: Emergency Medicine

## 2017-03-11 DIAGNOSIS — E119 Type 2 diabetes mellitus without complications: Secondary | ICD-10-CM | POA: Insufficient documentation

## 2017-03-11 DIAGNOSIS — Z87891 Personal history of nicotine dependence: Secondary | ICD-10-CM | POA: Insufficient documentation

## 2017-03-11 DIAGNOSIS — J189 Pneumonia, unspecified organism: Secondary | ICD-10-CM | POA: Diagnosis not present

## 2017-03-11 DIAGNOSIS — Z7984 Long term (current) use of oral hypoglycemic drugs: Secondary | ICD-10-CM | POA: Diagnosis not present

## 2017-03-11 DIAGNOSIS — J45909 Unspecified asthma, uncomplicated: Secondary | ICD-10-CM | POA: Diagnosis not present

## 2017-03-11 DIAGNOSIS — M546 Pain in thoracic spine: Secondary | ICD-10-CM | POA: Diagnosis present

## 2017-03-11 DIAGNOSIS — R0789 Other chest pain: Secondary | ICD-10-CM

## 2017-03-11 IMAGING — DX DG CHEST 2V
2 series · 2 of 2 positions shown · non-contrast
Comparison: [DATE]

CLINICAL DATA: Right-sided back pain and dyspnea

EXAM:
CHEST  2 VIEW

[chest pa]
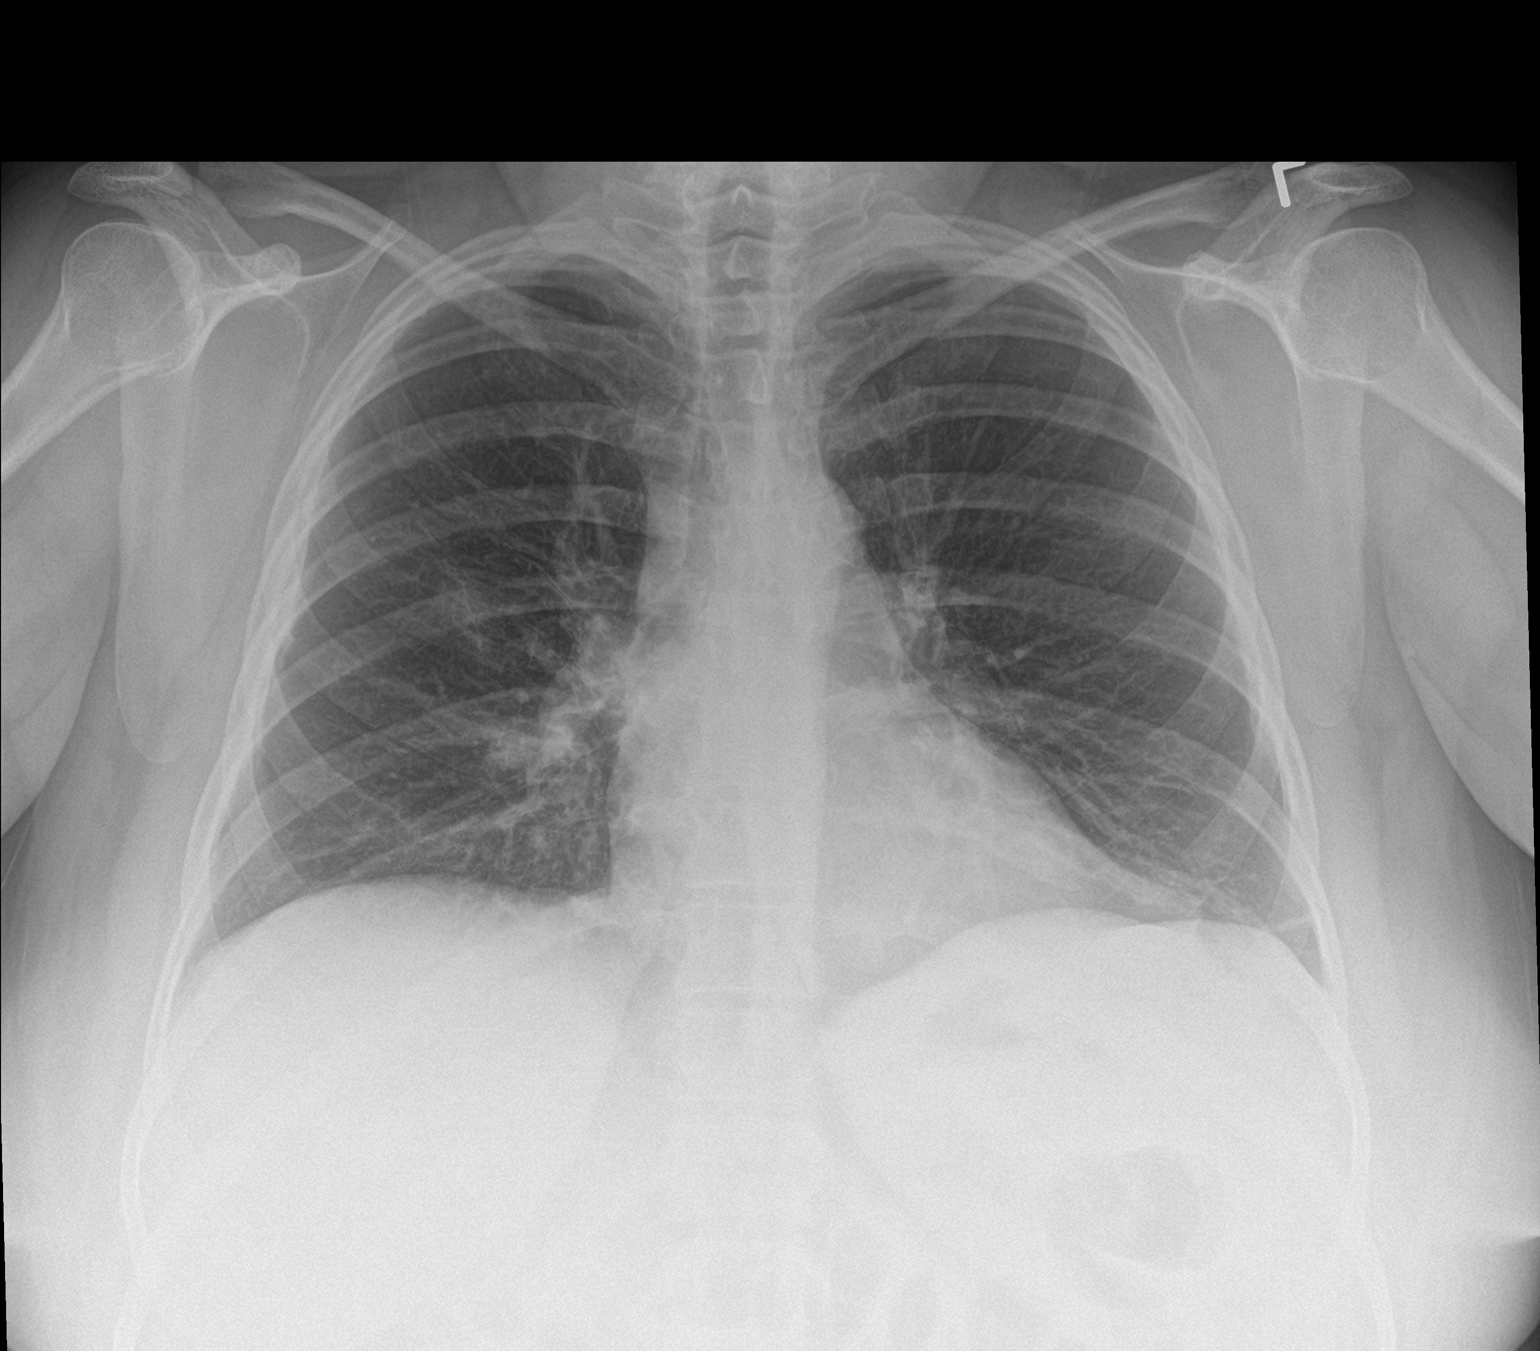

[chest lat]
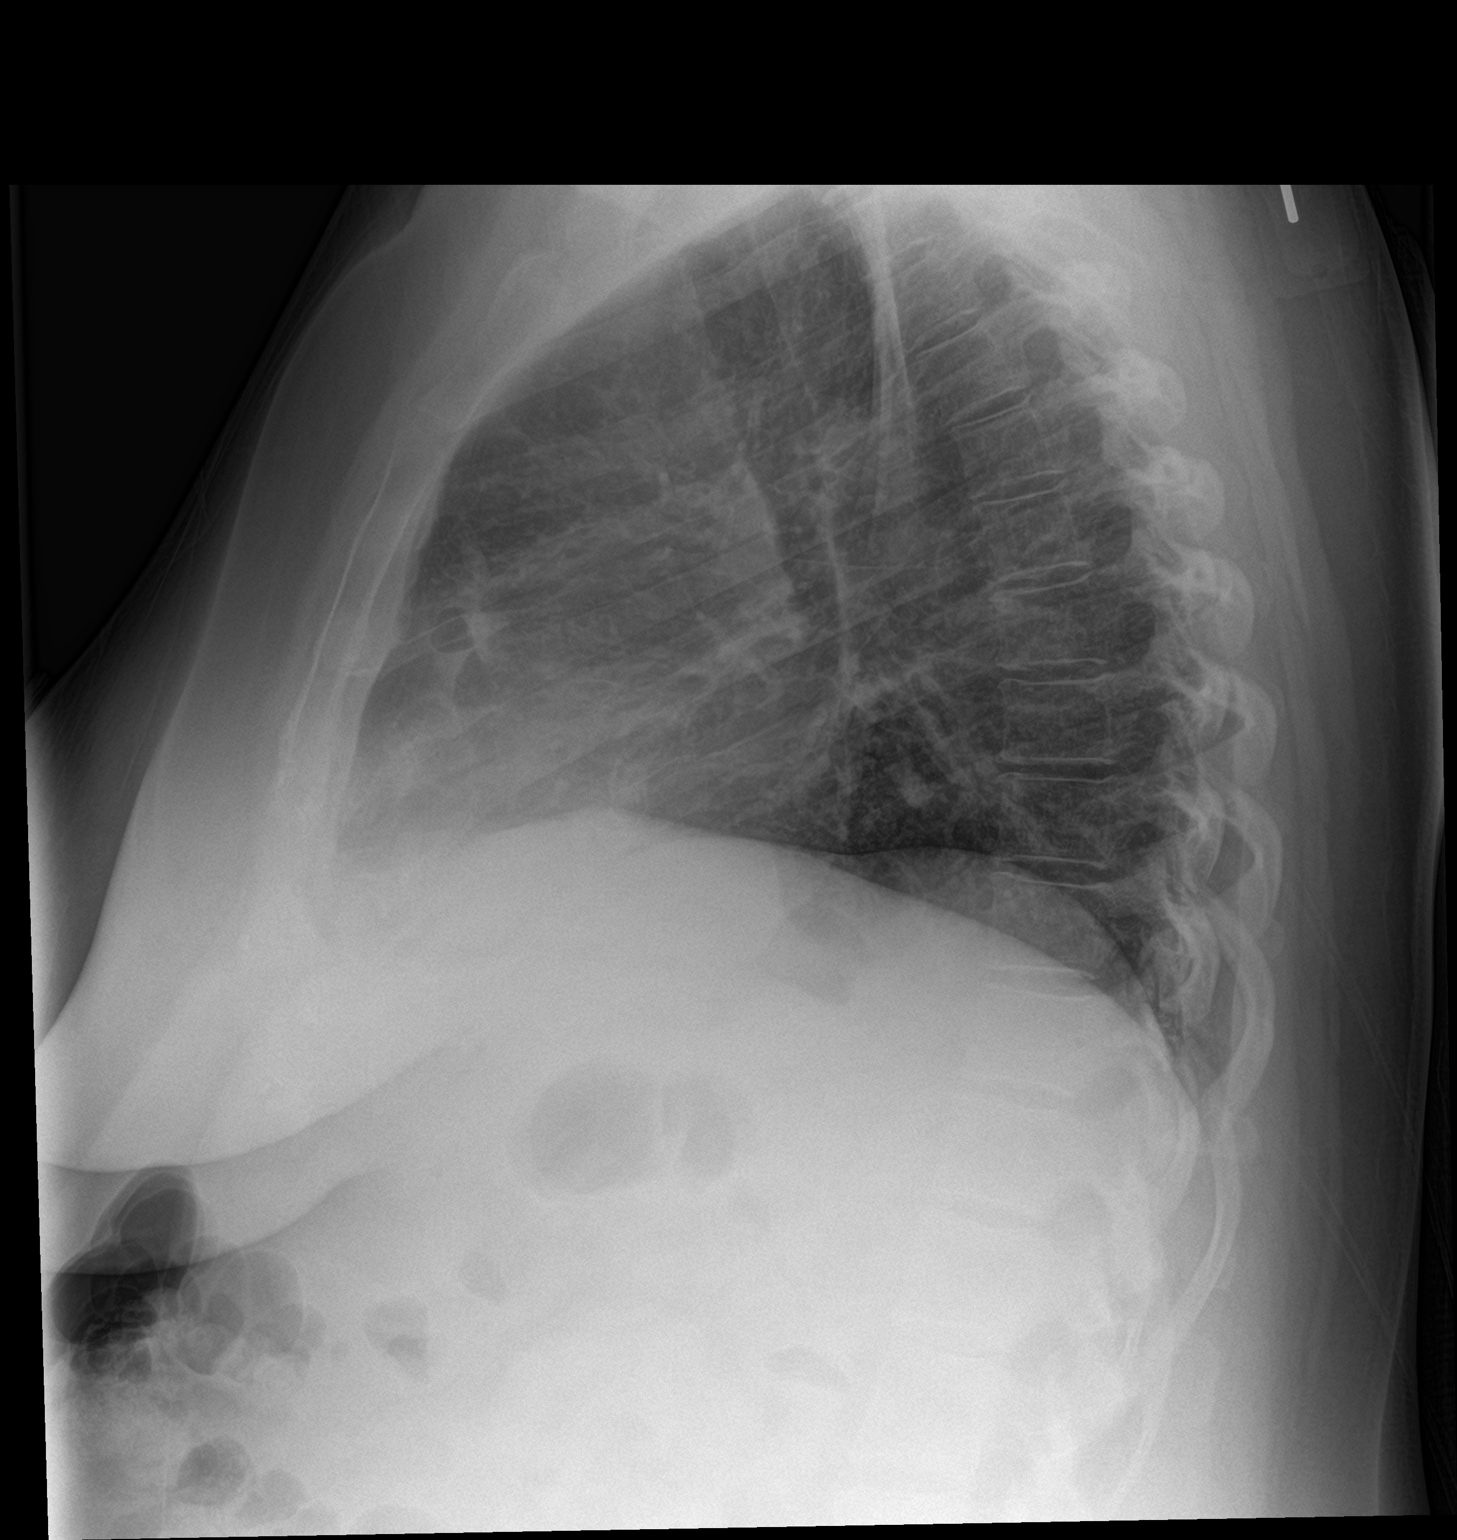

[2 of 2 positions shown; findings below may reference images not displayed]

FINDINGS: Heart and mediastinal contours are within normal limits and stable.
There is minimal atelectasis and/or scarring at the left lung base.
Faint airspace opacity noted in right mid lung with 1 cm thin walled
retrosternal cavity possibly a postinfectious pneumatocyst or bulla
outlined by adjacent airspace disease. No acute osseous abnormality.
IMPRESSION: Faint airspace opacity in the anterior right mid lung with 1 cm thin
walled cavitation noted possibly representing a small postinfectious
pneumatocyst. Followup PA and lateral chest X-ray is recommended in
3-4 weeks following trial of antibiotic therapy to ensure
resolution. Should findings persist, CT may be of value for further
correlation.

## 2017-03-11 MED ORDER — HYDROCODONE-ACETAMINOPHEN 5-325 MG PO TABS
2.0000 | ORAL_TABLET | Freq: Once | ORAL | Status: AC
  Administered 2017-03-11: 2 via ORAL
  Filled 2017-03-11: qty 2

## 2017-03-11 MED ORDER — LEVOFLOXACIN 500 MG PO TABS
500.0000 mg | ORAL_TABLET | Freq: Once | ORAL | Status: AC
  Administered 2017-03-11: 500 mg via ORAL
  Filled 2017-03-11: qty 1

## 2017-03-11 MED ORDER — HYDROCODONE-ACETAMINOPHEN 5-325 MG PO TABS: 2.0000 | ORAL_TABLET | ORAL | 0 refills | Status: DC | PRN

## 2017-03-11 MED ORDER — LEVOFLOXACIN 500 MG PO TABS: 500.0000 mg | ORAL_TABLET | Freq: Every day | ORAL | 0 refills | Status: DC

## 2017-03-11 NOTE — ED Triage Notes (Signed)
Right sided back pain hurts with a deep breath and hurts with movement.

## 2017-03-11 NOTE — ED Notes (Signed)
From radiology 

## 2017-03-11 NOTE — Discharge Instructions (Signed)
You need to have a repeat chest xray in 3 weeks to make sure area on chest xray clears.

## 2017-03-12 NOTE — ED Provider Notes (Signed)
[facility] DEPT Provider Note   CSN: [id number] Arrival date & time: 03/11/17  2115     History   Chief Complaint Chief Complaint  Patient presents with  . Back Pain    HPI Denise Barnett is a 46 y.o. female.  The history is provided by the patient. No language interpreter was used.  Back Pain   This is a new problem. The current episode started 12 to 24 hours ago. The problem occurs constantly. The problem has been gradually worsening. The pain is associated with no known injury. The quality of the pain is described as stabbing. The pain is moderate. The pain is the same all the time. She has tried nothing for the symptoms. The treatment provided no relief.  Pt complains of pain in right upper back.  Pt reports she has a sharp shooting pain that comes and goes.  Pt reports worse with a deep breath.  No hormones, no leg swelling, no chest pain,  No arm or leg swelling,  No recant travel  Past Medical History:  Diagnosis Date  . Asthma   . Bronchitis   . Diabetes mellitus without complication ([place])   . Lichen planus   . OSA (obstructive sleep apnea) 12/25/2013  . Pneumonia     Patient Active Problem List   Diagnosis Date Noted  . OSA (obstructive sleep apnea) 12/25/2013    Past Surgical History:  Procedure Laterality Date  . none      OB History    No data available       Home Medications    Prior to Admission medications   Medication Sig Start Date End Date Taking? Authorizing Provider  HUMIRA PEN 40 MG/0.8ML PNKT  02/03/17   Historical Provider, MD  HYDROcodone-acetaminophen (NORCO/VICODIN) 5-325 MG tablet Take 2 tablets by mouth every 4 (four) hours as needed. 03/11/17   Fransico Meadow, PA-C  ibuprofen (ADVIL,MOTRIN) 200 MG tablet Take 400 mg by mouth daily as needed. For pain    Historical Provider, MD  levofloxacin (LEVAQUIN) 500 MG tablet Take 1 tablet (500 mg total) by mouth daily. 03/11/17   Fransico Meadow, PA-C  metFORMIN (GLUCOPHAGE) 500 MG tablet  Take 500 mg by mouth 2 (two) times daily with a meal.  10/18/15   Historical Provider, MD  Norgestimate-Ethinyl Estradiol Triphasic 0.18/0.215/0.25 MG-35 MCG tablet Take 1 tablet by mouth daily. 09/23/13   Historical Provider, MD  simvastatin (ZOCOR) 20 MG tablet Take 20 mg by mouth daily. 02/01/17   Historical Provider, MD    Family History Family History  Problem Relation Age of Onset  . Sleep apnea Mother   . Heart attack Brother     Social History Social History  Substance Use Topics  . Smoking status: Former Research scientist (life sciences)  . Smokeless tobacco: Never Used  . Alcohol use No     Allergies   Bactrim [sulfamethoxazole-trimethoprim] and Penicillins   Review of Systems Review of Systems  Musculoskeletal: Positive for back pain.  All other systems reviewed and are negative.    Physical Exam Updated Vital Signs BP (!) 128/58 (BP Location: Right Arm)   Pulse 99   Temp 99.3 F (37.4 C) (Oral)   Resp 16   Ht 5\' 4"  (1.626 m)   Wt 99.3 kg   LMP 02/18/2017   SpO2 100%   BMI 37.59 kg/m   Physical Exam  Constitutional: She appears well-developed and well-nourished. No distress.  HENT:  Head: Normocephalic and atraumatic.  Eyes: Conjunctivae are normal.  Neck: Normal range of motion. Neck supple.  Cardiovascular: Normal rate and regular rhythm.   No murmur heard. Pulmonary/Chest: Effort normal and breath sounds normal. No respiratory distress.  Abdominal: Soft. There is no tenderness.  Musculoskeletal: She exhibits no edema.  nontender thoracic and lumbar spine  Neurological: She is alert.  Skin: Skin is warm and dry.  Psychiatric: She has a normal mood and affect.  Nursing note and vitals reviewed.    ED Treatments / Results  Labs (all labs ordered are listed, but only abnormal results are displayed) Labs Reviewed - No data to display  EKG  EKG Interpretation None       Radiology Dg Chest 2 View  Result Date: 03/11/2017 CLINICAL DATA:  Right-sided back pain  and dyspnea EXAM: CHEST  2 VIEW COMPARISON:  01/04/2013 FINDINGS: Heart and mediastinal contours are within normal limits and stable. There is minimal atelectasis and/or scarring at the left lung base. Faint airspace opacity noted in right mid lung with 1 cm thin walled retrosternal cavity possibly a postinfectious pneumatocyst or bulla outlined by adjacent airspace disease. No acute osseous abnormality. IMPRESSION: Faint airspace opacity in the anterior right mid lung with 1 cm thin walled cavitation noted possibly representing a small postinfectious pneumatocyst. Followup PA and lateral chest X-ray is recommended in 3-4 weeks following trial of antibiotic therapy to ensure resolution. Should findings persist, CT may be of value for further correlation. Electronically Signed   By: Ashley Royalty M.D.   On: 03/11/2017 23:12    Procedures Procedures (including critical care time)  Medications Ordered in ED Medications  levofloxacin (LEVAQUIN) tablet 500 mg (500 mg Oral Given 03/11/17 2325)  HYDROcodone-acetaminophen (NORCO/VICODIN) 5-325 MG per tablet 2 tablet (2 tablets Oral Given 03/11/17 2325)     Initial Impression / Assessment and Plan / ED Course  I have reviewed the triage vital signs and the nursing notes.  Pertinent labs & imaging results that were available during my care of the patient were reviewed by me and considered in my medical decision making (see chart for details).     Pt advised of need for follow up chest xray in 3 weeks.  Pt counseled on results  Final Clinical Impressions(s) / ED Diagnoses   Final diagnoses:  Chest wall pain  Community acquired pneumonia of right lung, unspecified part of lung    New Prescriptions Discharge Medication List as of 03/11/2017 11:17 PM    START taking these medications   Details  HYDROcodone-acetaminophen (NORCO/VICODIN) 5-325 MG tablet Take 2 tablets by mouth every 4 (four) hours as needed., Starting Fri 03/11/2017, Print      levofloxacin (LEVAQUIN) 500 MG tablet Take 1 tablet (500 mg total) by mouth daily., Starting Fri 03/11/2017, Print      An After Visit Summary was printed and given to the patient.   [place], PA-C 03/12/17 0009    Fredia Sorrow, MD 03/13/17 [id number]

## 2017-03-31 ENCOUNTER — Ambulatory Visit
Admission: RE | Admit: 2017-03-31 | Discharge: 2017-03-31 | Disposition: A | Discharge status: Home / Self Care | Payer: Managed Care, Other (non HMO) | Source: Ambulatory Visit | Attending: Family Medicine | Admitting: Family Medicine

## 2017-03-31 ENCOUNTER — Other Ambulatory Visit: Payer: Self-pay | Admitting: Family Medicine

## 2017-03-31 DIAGNOSIS — J984 Other disorders of lung: Secondary | ICD-10-CM

## 2017-03-31 IMAGING — DX DG CHEST 2V
2 series · 2 of 2 positions shown · non-contrast
Comparison: [DATE]

CLINICAL DATA: Cyst of lung.

EXAM:
CHEST  2 VIEW

[dg chest 2 view (1 of 2)]
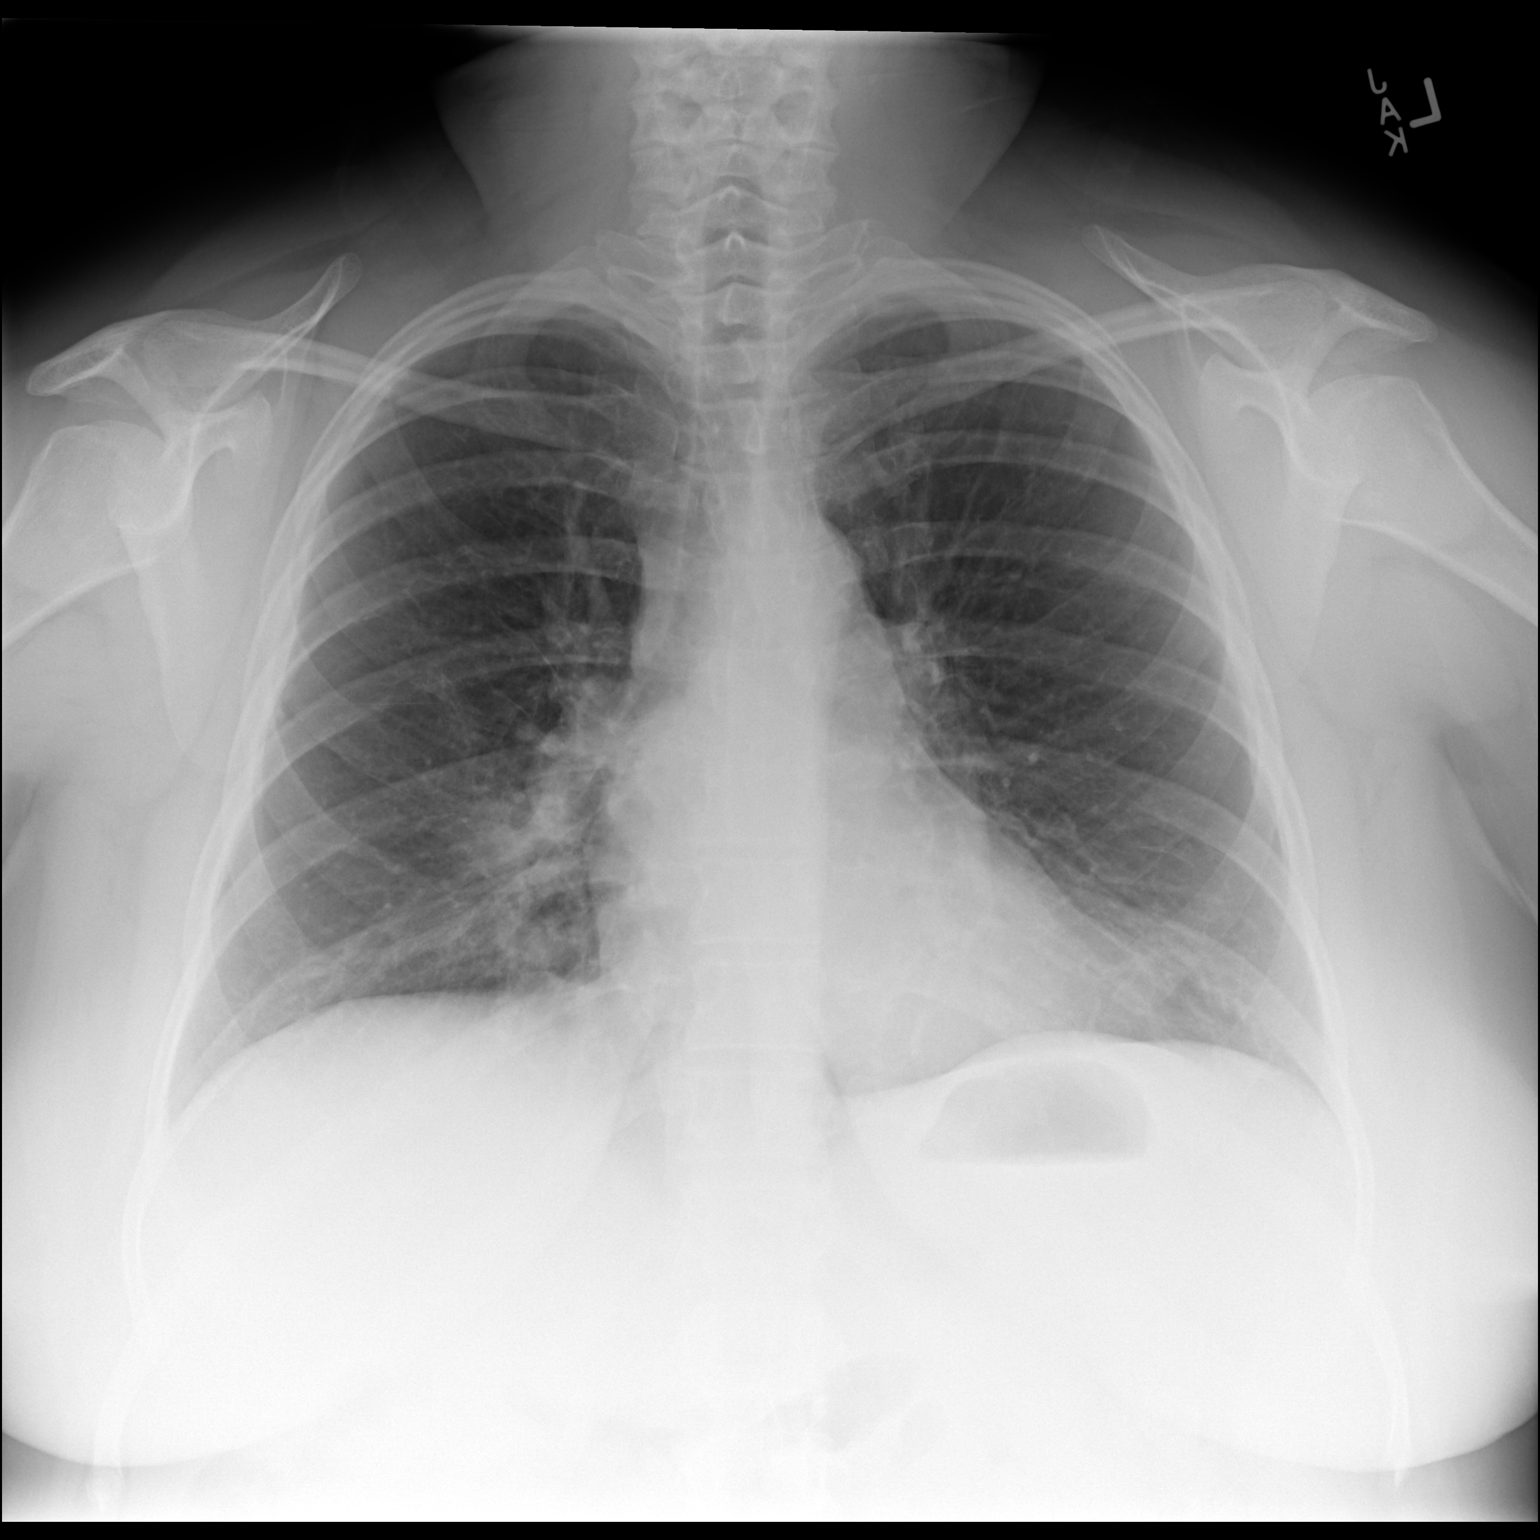

[dg chest 2 view (2 of 2)]
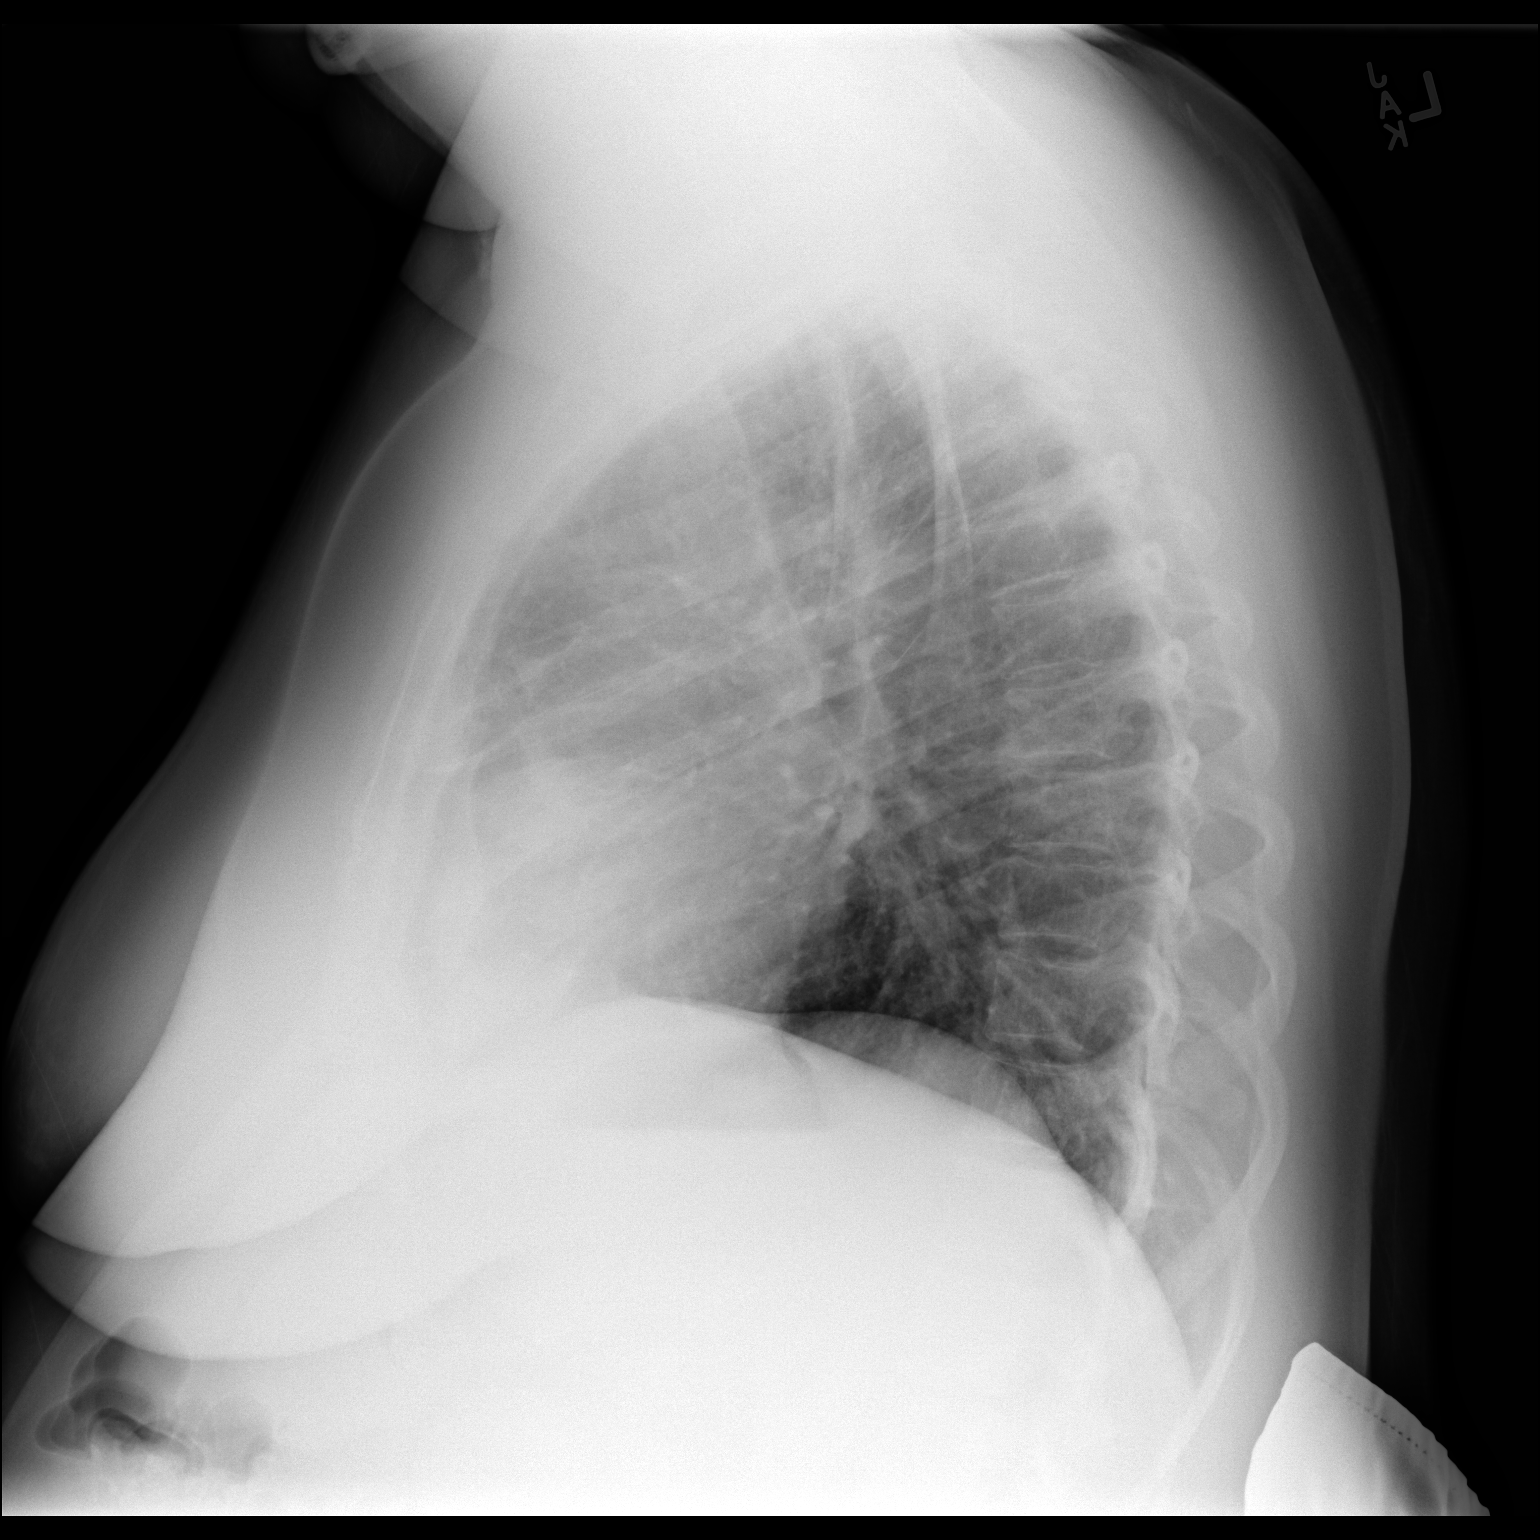

[2 of 2 positions shown; findings below may reference images not displayed]

FINDINGS: Low volume chest. There is patchy density at the right middle lobe,
progressed. No Kerley lines, effusion, or pneumothorax. Normal heart
size. No evidence of adenopathy.
IMPRESSION: Progressed right middle lobe opacity. This could reflect untreated
pneumonia or other acute airspace disease. Recommend close imaging
follow-up in this patient on Humira.

## 2017-04-11 ENCOUNTER — Ambulatory Visit
Admission: RE | Admit: 2017-04-11 | Discharge: 2017-04-11 | Disposition: A | Discharge status: Home / Self Care | Payer: Managed Care, Other (non HMO) | Source: Ambulatory Visit | Attending: Family Medicine | Admitting: Family Medicine

## 2017-04-11 ENCOUNTER — Other Ambulatory Visit: Payer: Self-pay | Admitting: Family Medicine

## 2017-04-11 DIAGNOSIS — R918 Other nonspecific abnormal finding of lung field: Secondary | ICD-10-CM

## 2017-04-11 IMAGING — CR DG CHEST 2V
2 series · 2 of 2 positions shown · non-contrast
Comparison: PA and lateral chest x-ray [DATE] and [DATE].

CLINICAL DATA: Slight cough, abnormal chest x-ray 11 days ago.
History of asthma, diabetes, former smoker.

EXAM:
CHEST  2 VIEW

[w chest pa]
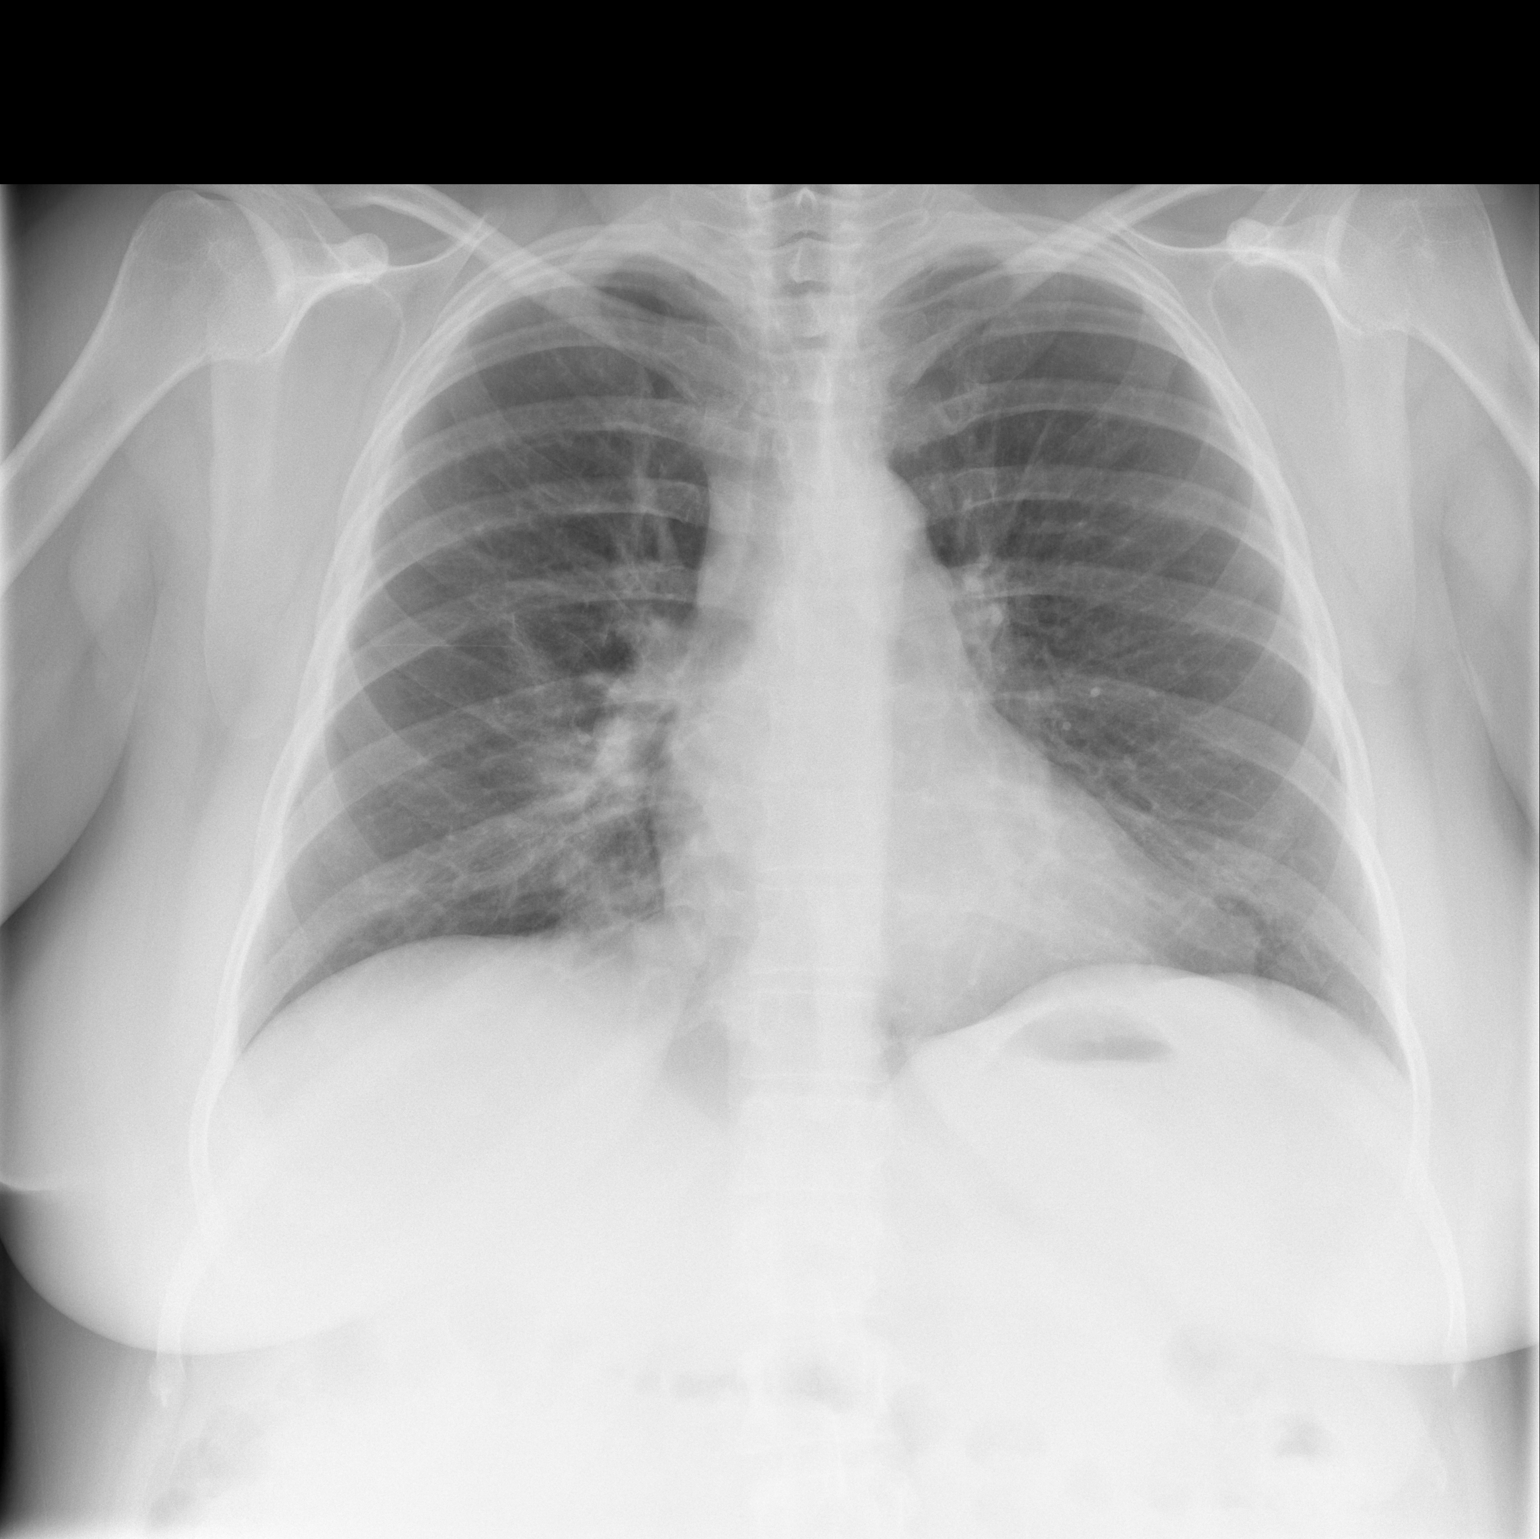

[w chest lat]
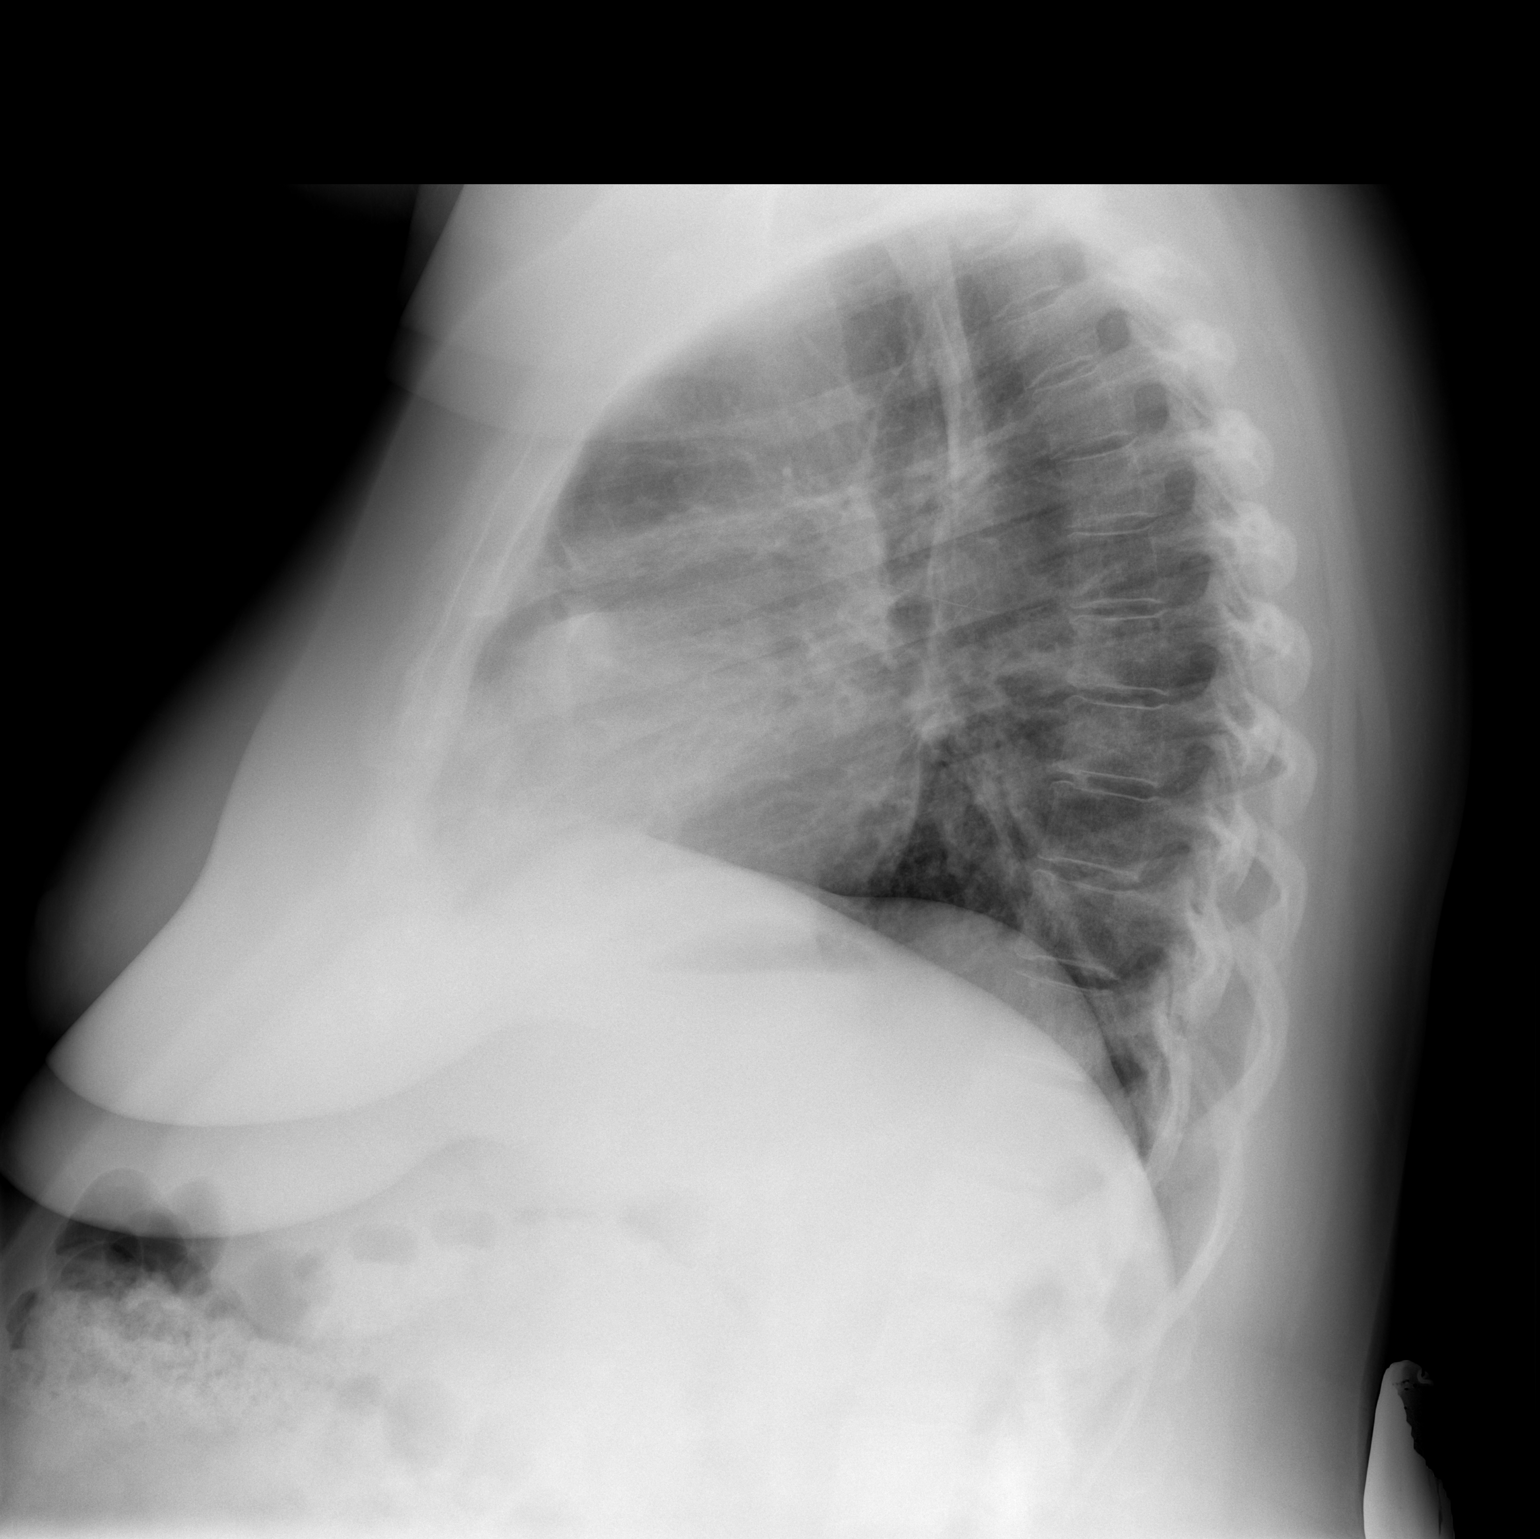

[2 of 2 positions shown; findings below may reference images not displayed]

FINDINGS: There remains increased density in the right middle lobe anteriorly.
Slight interval increasing conspicuity of mid lung density on the
right as well. The left lung is clear. There is no pleural effusion
or pneumothorax. The heart and pulmonary vascularity are normal. The
trachea is midline. The bony thorax exhibits no acute abnormality.
IMPRESSION: Persistent increased density in the anterior aspect of the right
middle lobe. Chest CT scanning is recommended to exclude occult
malignancy.

## 2017-04-13 ENCOUNTER — Other Ambulatory Visit: Payer: Self-pay | Admitting: Family Medicine

## 2017-04-13 DIAGNOSIS — R911 Solitary pulmonary nodule: Secondary | ICD-10-CM

## 2017-04-14 ENCOUNTER — Ambulatory Visit
Admission: RE | Admit: 2017-04-14 | Discharge: 2017-04-14 | Disposition: A | Discharge status: Home / Self Care | Payer: Managed Care, Other (non HMO) | Source: Ambulatory Visit | Attending: Family Medicine | Admitting: Family Medicine

## 2017-04-14 DIAGNOSIS — R911 Solitary pulmonary nodule: Secondary | ICD-10-CM

## 2017-04-14 IMAGING — CT CT CHEST W/ CM
2 of 3 series · 16 of 31 positions shown, 18 images · IV contrast (75CC ISOVUE 370)
Comparison: [DATE] chest radiograph.

CLINICAL DATA: Anterior right mid lung opacities on chest
radiographs.

EXAM:
CT CHEST WITH CONTRAST
TECHNIQUE: Multidetector CT imaging of the chest was performed during
intravenous contrast administration.
Creatinine was obtained on site at [HOSPITAL] at [REDACTED].
Results: Creatinine 0.5 mg/dL.
CONTRAST:  75mL [7N] IOPAMIDOL ([7N]) INJECTION 61%

[Series 3: chest with · axial · 0.89mm/px · z∈[-248,-38]mm · 8 of 110 slices shown, 10 images]
[im 13/110  mediastinal]
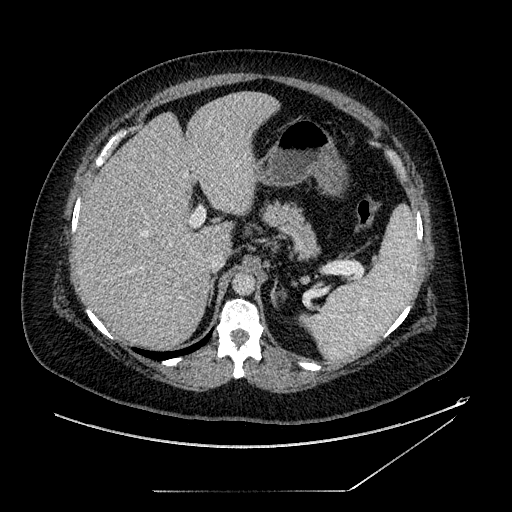
[im 13/110  lung]
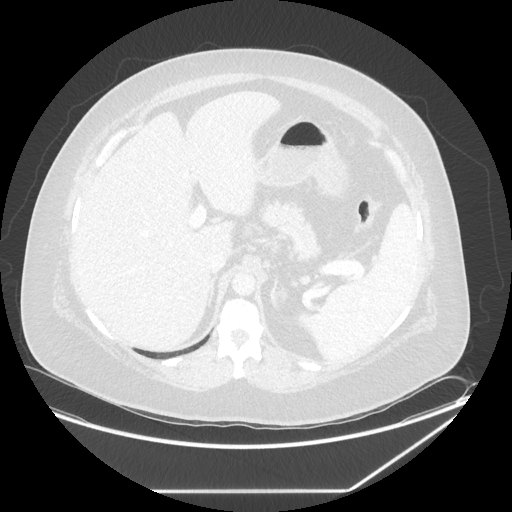
[im 25/110  lung]
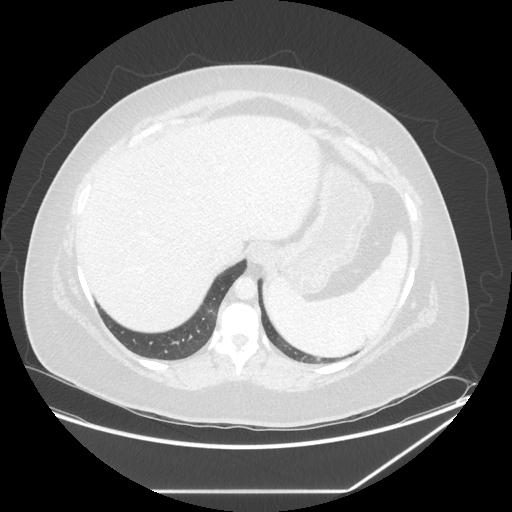
[im 37/110  lung]
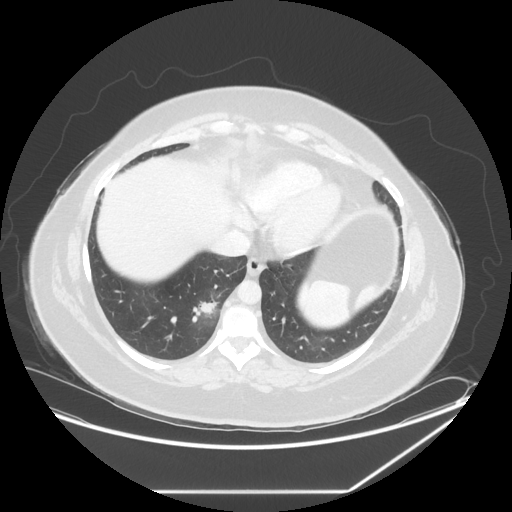
[im 49/110  lung]
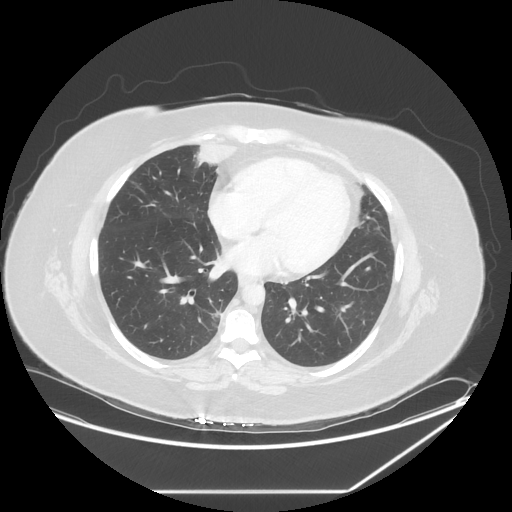
[im 61/110  mediastinal]
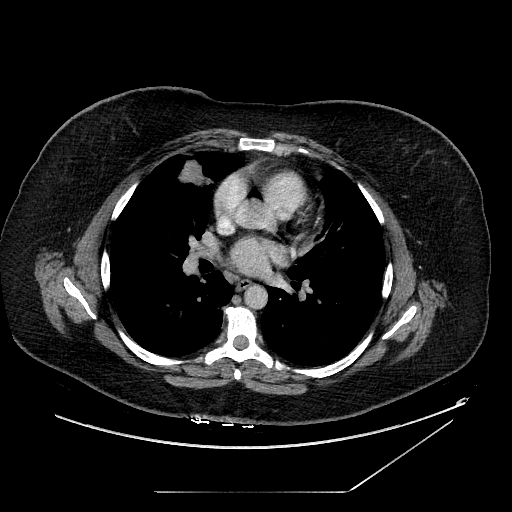
[im 61/110  lung]
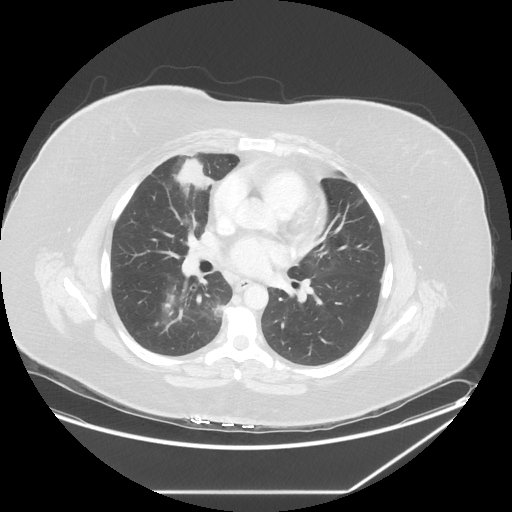
[im 73/110  lung]
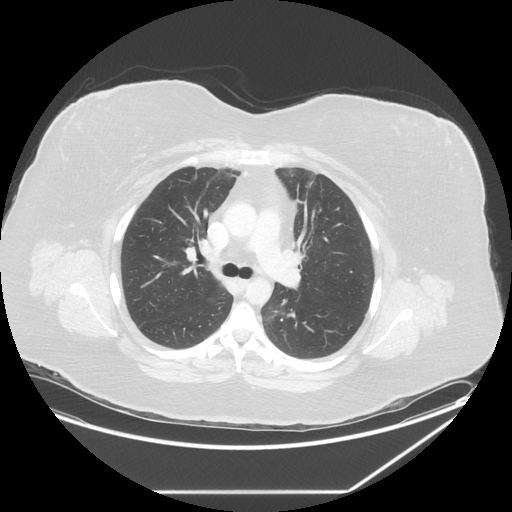
[im 85/110  lung]
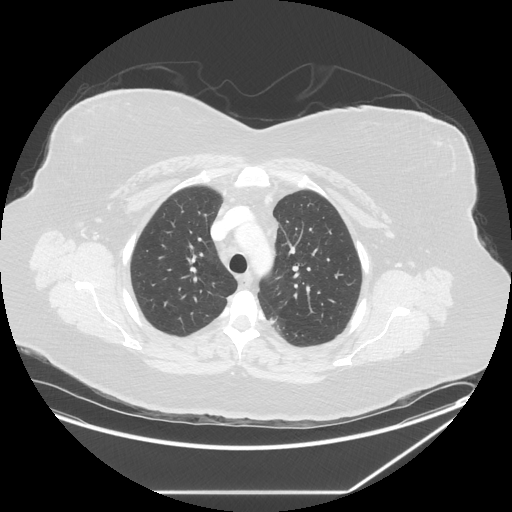
[im 97/110  lung]
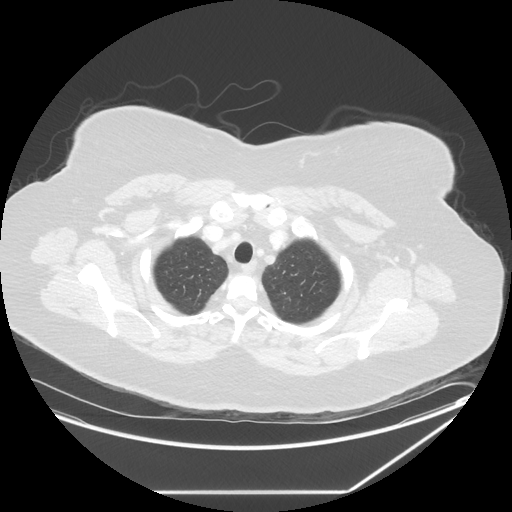

[Series 602: sagittal body · sagittal · 0.89mm/px · 8 of 184 slices shown]
[im 22/184  mediastinal]
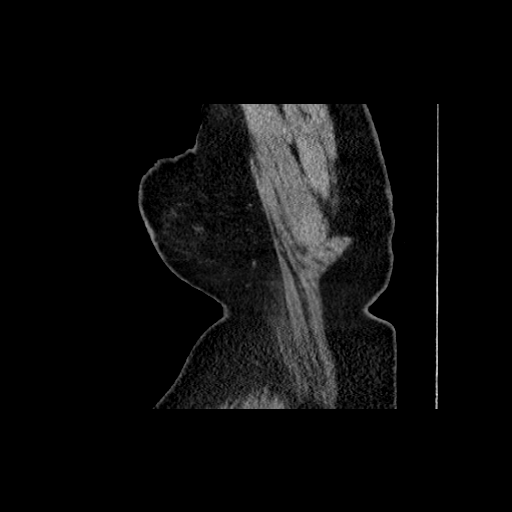
[im 44/184  mediastinal]
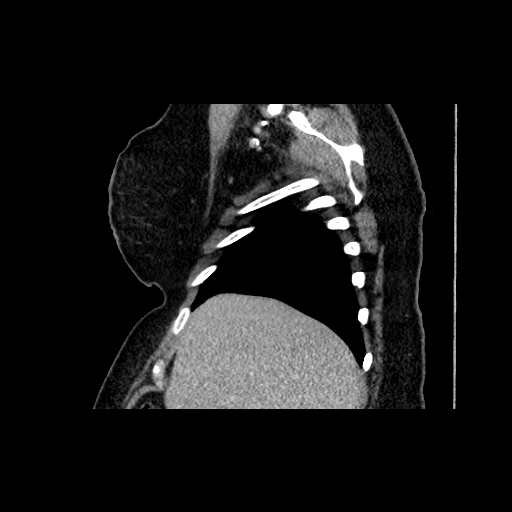
[im 65/184  mediastinal]
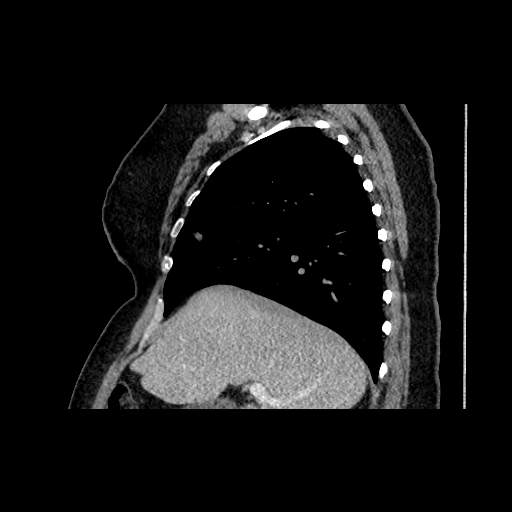
[im 87/184  mediastinal]
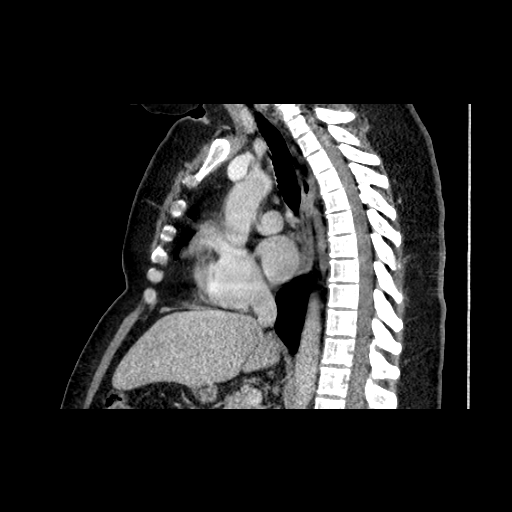
[im 97/184  mediastinal]
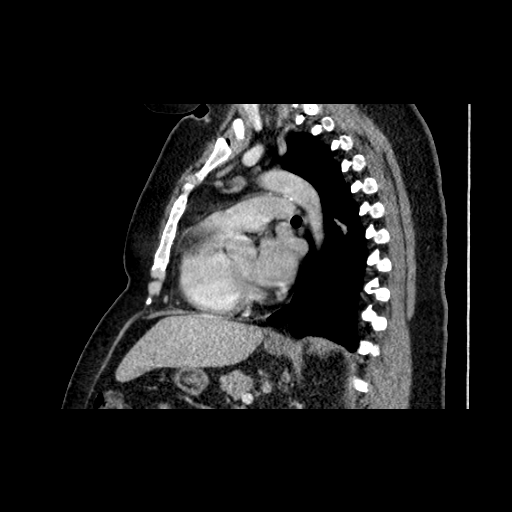
[im 119/184  mediastinal]
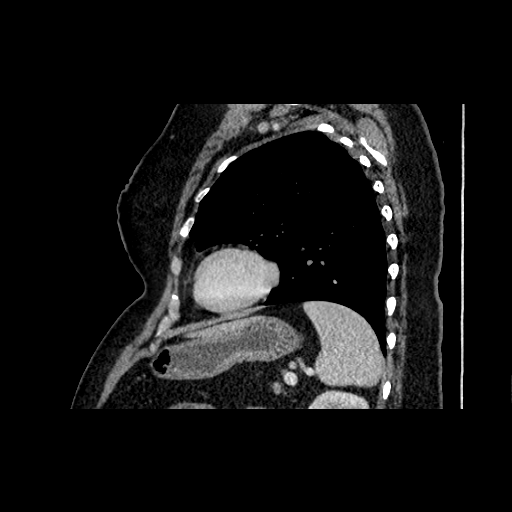
[im 140/184  mediastinal]
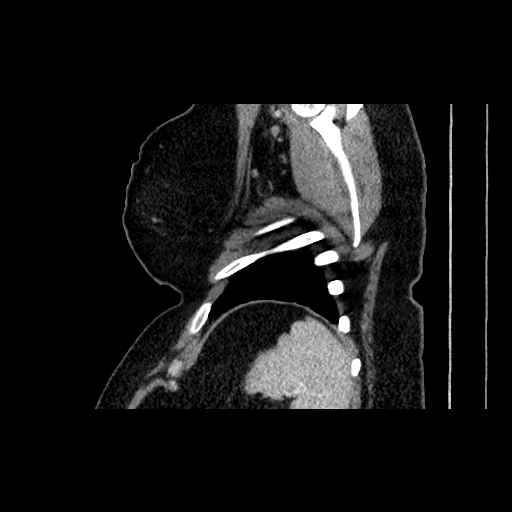
[im 162/184  mediastinal]
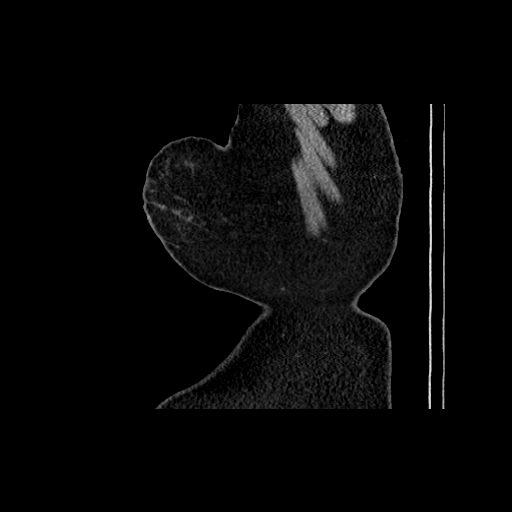

[16 of 31 positions shown; findings below may reference images not displayed]

FINDINGS: Cardiovascular: Normal heart size. No significant pericardial
fluid/thickening. Atherosclerotic nonaneurysmal thoracic aorta.
Normal caliber pulmonary arteries. No central pulmonary emboli.

Mediastinum/Nodes: No discrete thyroid nodules. Unremarkable
esophagus. No axillary adenopathy. Mildly enlarged 1.0 cm subcarinal
node (series 3/ image 44). No additional pathologically enlarged
mediastinal or hilar lymph nodes.

Lungs/Pleura: No pneumothorax. No pleural effusion. There are
several (greater than 5) poorly marginated masslike and nodular foci
of consolidation scattered in the right greater than left lungs
involving the right middle lobe and bilateral lower lobes, each
demonstrating surrounding ground-glass attenuation. For example, a
3.8 x 2.3 cm masslike focus of consolidation in the anterior right
middle lobe (series 4/ image 49), a 3.7 x 1.5 cm masslike focus of
consolidation in the anteromedial right middle lobe (series 4/ image
59), a 2.4 x 1.9 cm medial right lower lobe nodular focus of
consolidation (series 4/ image 55) and a 1.4 x 0.7 cm nodular medial
left lower lobe focus of consolidation (series 4/image 45).

Upper abdomen: Unremarkable.

Musculoskeletal: No aggressive appearing focal osseous lesions.
Scattered subcentimeter sclerotic foci in the thoracic vertebral
bodies are too small to characterize and probably benign bone
islands. Minimal thoracic spondylosis.
IMPRESSION: 1. Several poorly marginated masslike and nodular foci of
consolidation scattered in the right greater than left lungs with
surrounding ground-glass attenuation. Given the recent clear
radiographic progression of these opacities between [DATE] and
[DATE], a multilobar infection is favored, with the differential
including atypical infections such as fungal pneumonia given the
masslike/nodular quality of the opacities and surrounding
ground-glass attenuation. Post treatment follow-up chest CT advised
in 3 months.
2. Nonspecific mild mediastinal lymphadenopathy, probably reactive.
3. Aortic atherosclerosis.

## 2017-04-14 MED ORDER — IOPAMIDOL (ISOVUE-300) INJECTION 61%
75.0000 mL | Freq: Once | INTRAVENOUS | Status: AC | PRN
  Administered 2017-04-14: 75 mL via INTRAVENOUS

## 2017-04-15 ENCOUNTER — Inpatient Hospital Stay (HOSPITAL_COMMUNITY)
Admission: EM | Admit: 2017-04-15 | Discharge: 2017-04-19 | DRG: 167 | Disposition: A | Discharge status: Home / Self Care | Payer: Managed Care, Other (non HMO) | Attending: Internal Medicine | Admitting: Internal Medicine

## 2017-04-15 ENCOUNTER — Encounter (HOSPITAL_COMMUNITY): Payer: Self-pay | Admitting: *Deleted

## 2017-04-15 DIAGNOSIS — R0609 Other forms of dyspnea: Secondary | ICD-10-CM

## 2017-04-15 DIAGNOSIS — J849 Interstitial pulmonary disease, unspecified: Secondary | ICD-10-CM | POA: Diagnosis present

## 2017-04-15 DIAGNOSIS — E119 Type 2 diabetes mellitus without complications: Secondary | ICD-10-CM | POA: Diagnosis present

## 2017-04-15 DIAGNOSIS — Z7984 Long term (current) use of oral hypoglycemic drugs: Secondary | ICD-10-CM

## 2017-04-15 DIAGNOSIS — Z79899 Other long term (current) drug therapy: Secondary | ICD-10-CM

## 2017-04-15 DIAGNOSIS — R05 Cough: Secondary | ICD-10-CM | POA: Diagnosis not present

## 2017-04-15 DIAGNOSIS — L409 Psoriasis, unspecified: Secondary | ICD-10-CM

## 2017-04-15 DIAGNOSIS — J45909 Unspecified asthma, uncomplicated: Secondary | ICD-10-CM | POA: Diagnosis present

## 2017-04-15 DIAGNOSIS — R918 Other nonspecific abnormal finding of lung field: Secondary | ICD-10-CM

## 2017-04-15 DIAGNOSIS — Z87891 Personal history of nicotine dependence: Secondary | ICD-10-CM

## 2017-04-15 DIAGNOSIS — Z6841 Body Mass Index (BMI) 40.0 and over, adult: Secondary | ICD-10-CM

## 2017-04-15 DIAGNOSIS — K5900 Constipation, unspecified: Secondary | ICD-10-CM | POA: Diagnosis present

## 2017-04-15 DIAGNOSIS — J189 Pneumonia, unspecified organism: Secondary | ICD-10-CM

## 2017-04-15 DIAGNOSIS — G4733 Obstructive sleep apnea (adult) (pediatric): Secondary | ICD-10-CM | POA: Diagnosis present

## 2017-04-15 NOTE — ED Triage Notes (Signed)
Pt reports sent here for CT by Dr Clotilde Dieter at [facility] for a lung infection  Pt is clear voiced and speaks in complete sentences

## 2017-04-15 NOTE — ED Provider Notes (Signed)
[facility] DEPT Provider Note   CSN: [id number] Arrival date & time: 04/15/17  1728  By signing my name below, I, Oleh Genin, attest that this documentation has been prepared under the direction and in the presence of Pollina, Gwenyth Allegra, *. Electronically Signed: Oleh Genin, Scribe. 04/15/17. 11:51 PM.   History   Chief Complaint Chief Complaint  Patient presents with  . Cough    HPI Tanveer CAMIELLE SIZER is a 46 y.o. female with history of OSA and diabetes who presents to the ED following abnormal imaging. This patient was initially seen on 3/30 with R lateral chest pain. A plain film of the chest was obtained which was concerning for possible small postinfectious pneumatocyst. Discharged on Levaquin. She received followup serial chest x-rays and finally CT chest yesterday which demonstrated progression in the opacities visualized on 3/30. Presents to this facility for workup. At interview she is reporting ongoing R lateral chest pain for 1 month and more recent onset of L lateral chest pain and dyspnea. She denies any fever.  The history is provided by the patient. No language interpreter was used.  Chest Pain   This is a new problem. The current episode started more than 1 week ago. The problem occurs constantly. The problem has not changed since onset.Pain location: bilateral. The pain is moderate. Associated symptoms include shortness of breath. Pertinent negatives include no fever. Treatments tried: levaquin.    Past Medical History:  Diagnosis Date  . Asthma   . Bronchitis   . Diabetes mellitus without complication ([place])   . Lichen planus   . OSA (obstructive sleep apnea) 12/25/2013  . Pneumonia     Patient Active Problem List   Diagnosis Date Noted  . OSA (obstructive sleep apnea) 12/25/2013    Past Surgical History:  Procedure Laterality Date  . none      OB History    No data available       Home Medications    Prior to Admission medications    Medication Sig Start Date End Date Taking? Authorizing Provider  HUMIRA PEN 40 MG/0.8ML PNKT  02/03/17   [provider]  HYDROcodone-acetaminophen (NORCO/VICODIN) 5-325 MG tablet Take 2 tablets by mouth every 4 (four) hours as needed. 03/11/17   Fransico Meadow, PA-C  ibuprofen (ADVIL,MOTRIN) 200 MG tablet Take 400 mg by mouth daily as needed. For pain    [provider]  levofloxacin (LEVAQUIN) 500 MG tablet Take 1 tablet (500 mg total) by mouth daily. 03/11/17   Fransico Meadow, PA-C  metFORMIN (GLUCOPHAGE) 500 MG tablet Take 500 mg by mouth 2 (two) times daily with a meal.  10/18/15   [provider]  Norgestimate-Ethinyl Estradiol Triphasic 0.18/0.215/0.25 MG-35 MCG tablet Take 1 tablet by mouth daily. 09/23/13   [provider]  simvastatin (ZOCOR) 20 MG tablet Take 20 mg by mouth daily. 02/01/17   [provider]    Family History Family History  Problem Relation Age of Onset  . Sleep apnea Mother   . Heart attack Brother     Social History Social History  Substance Use Topics  . Smoking status: Former Research scientist (life sciences)  . Smokeless tobacco: Never Used  . Alcohol use No     Allergies   Bactrim [sulfamethoxazole-trimethoprim] and Penicillins   Review of Systems Review of Systems  Constitutional: Negative for fever.  Respiratory: Positive for shortness of breath.   Cardiovascular: Positive for chest pain.  All other systems reviewed and are negative.  Physical Exam Updated Vital Signs BP 129/66 (BP Location: Right Arm)   Pulse 87   Resp 18   LMP 04/15/2017   SpO2 96%   Physical Exam  Constitutional: She is oriented to person, place, and time. She appears well-developed and well-nourished. No distress.  HENT:  Head: Normocephalic and atraumatic.  Right Ear: Hearing normal.  Left Ear: Hearing normal.  Nose: Nose normal.  Mouth/Throat: Oropharynx is clear and moist and mucous membranes are normal.  Eyes: Conjunctivae and EOM  are normal. Pupils are equal, round, and reactive to light.  Neck: Normal range of motion. Neck supple.  Cardiovascular: Regular rhythm, S1 normal and S2 normal.  Exam reveals no gallop and no friction rub.   No murmur heard. Pulmonary/Chest: Effort normal and breath sounds normal. No respiratory distress. She exhibits no tenderness.  Abdominal: Soft. Normal appearance and bowel sounds are normal. There is no hepatosplenomegaly. There is no tenderness. There is no rebound, no guarding, no tenderness at McBurney's point and negative Murphy's sign. No hernia.  Musculoskeletal: Normal range of motion.  Neurological: She is alert and oriented to person, place, and time. She has normal strength. No cranial nerve deficit or sensory deficit. Coordination normal. GCS eye subscore is 4. GCS verbal subscore is 5. GCS motor subscore is 6.  Skin: Skin is warm, dry and intact. No rash noted. No cyanosis.  Psychiatric: She has a normal mood and affect. Her speech is normal and behavior is normal. Thought content normal.  Nursing note and vitals reviewed.    ED Treatments / Results  Labs (all labs ordered are listed, but only abnormal results are displayed) Labs Reviewed  CBC WITH DIFFERENTIAL/PLATELET - Abnormal; Notable for the following:       Result Value   WBC 11.0 (*)    HCT 35.6 (*)    Monocytes Absolute 1.2 (*)    Eosinophils Absolute 0.8 (*)    All other components within normal limits  BASIC METABOLIC PANEL - Abnormal; Notable for the following:    Glucose, Bld 176 (*)    All other components within normal limits  CULTURE, BLOOD (ROUTINE X 2)  CULTURE, BLOOD (ROUTINE X 2)  CULTURE, BLOOD (ROUTINE X 2)  CULTURE, BLOOD (ROUTINE X 2)  I-STAT CG4 LACTIC ACID, ED    EKG  EKG Interpretation None       Radiology Ct Chest W Contrast  Result Date: 04/15/2017 CLINICAL DATA:  Anterior right mid lung opacities on chest radiographs. EXAM: CT CHEST WITH CONTRAST TECHNIQUE: Multidetector CT  imaging of the chest was performed during intravenous contrast administration. Creatinine was obtained on site at [facility] at [address] Results: Creatinine 0.5 mg/dL. CONTRAST:  78mL ISOVUE-300 IOPAMIDOL (ISOVUE-300) INJECTION 61% COMPARISON:  04/11/2017 chest radiograph. FINDINGS: Cardiovascular: Normal heart size. No significant pericardial fluid/thickening. Atherosclerotic nonaneurysmal thoracic aorta. Normal caliber pulmonary arteries. No central pulmonary emboli. Mediastinum/Nodes: No discrete thyroid nodules. Unremarkable esophagus. No axillary adenopathy. Mildly enlarged 1.0 cm subcarinal node (series 3/ image 44). No additional pathologically enlarged mediastinal or hilar lymph nodes. Lungs/Pleura: No pneumothorax. No pleural effusion. There are several (greater than 5) poorly marginated masslike and nodular foci of consolidation scattered in the right greater than left lungs involving the right middle lobe and bilateral lower lobes, each demonstrating surrounding ground-glass attenuation. For example, a 3.8 x 2.3 cm masslike focus of consolidation in the anterior right middle lobe (series 4/ image 49), a 3.7 x 1.5 cm masslike focus of consolidation in the anteromedial  right middle lobe (series 4/ image 59), a 2.4 x 1.9 cm medial right lower lobe nodular focus of consolidation (series 4/ image 55) and a 1.4 x 0.7 cm nodular medial left lower lobe focus of consolidation (series 4/image 45). Upper abdomen: Unremarkable. Musculoskeletal: No aggressive appearing focal osseous lesions. Scattered subcentimeter sclerotic foci in the thoracic vertebral bodies are too small to characterize and probably benign bone islands. Minimal thoracic spondylosis. IMPRESSION: 1. Several poorly marginated masslike and nodular foci of consolidation scattered in the right greater than left lungs with surrounding ground-glass attenuation. Given the recent clear radiographic progression of these opacities between  03/11/2017 and 03/31/2017, a multilobar infection is favored, with the differential including atypical infections such as fungal pneumonia given the masslike/nodular quality of the opacities and surrounding ground-glass attenuation. Post treatment follow-up chest CT advised in 3 months. 2. Nonspecific mild mediastinal lymphadenopathy, probably reactive. 3. Aortic atherosclerosis. Electronically Signed   By: Ilona Sorrel M.D.   On: 04/15/2017 11:29    Procedures Procedures (including critical care time)  Medications Ordered in ED Medications - No data to display   Initial Impression / Assessment and Plan / ED Course  I have reviewed the triage vital signs and the nursing notes.  Pertinent labs & imaging results that were available during my care of the patient were reviewed by me and considered in my medical decision making (see chart for details).     Patient has been sick with cough and chest congestion for one month. She was originally seen in this ER and diagnosed with pneumonia by x-ray. She completed a course of Levaquin but did not improve symptomatically. She followed up with primary care doctor who performed an x-ray which did not show improvement. X-ray was repeated one week later and there was still no improvement. Patient had outpatient CT scan performed today. She was called by her primary doctor and told she needed to come to the ER because of the findings. Findings were suspicious for either atypical pneumonia or possible fungal pneumonia. Findings are now in both lungs.  Discussed with Dr. Tera Partridge, on call for pulmonary/critical care. He reviewed the images and also raises concern for possible septic emboli. Recommends series of blood cultures, admission to [facility] echo to rule out cardiac vegetations and pulmonary consultation. Hold off on antibiotics for now.  Final Clinical Impressions(s) / ED Diagnoses   Final diagnoses:  Community acquired pneumonia,  unspecified laterality    New Prescriptions New Prescriptions   No medications on file  I personally performed the services described in this documentation, which was scribed in my presence. The recorded information has been reviewed and is accurate.    Orpah Greek, MD 04/16/17 [phone]

## 2017-04-15 NOTE — ED Triage Notes (Signed)
Pt was told to come here by her PCP. She had a recent CT chest done which was concerning to her doctor. Pt has no SOB at present.  Pt pleasant with staff.

## 2017-04-15 NOTE — ED Triage Notes (Signed)
Informed of delay and approx wait time

## 2017-04-16 ENCOUNTER — Observation Stay (HOSPITAL_BASED_OUTPATIENT_CLINIC_OR_DEPARTMENT_OTHER): Payer: Managed Care, Other (non HMO)

## 2017-04-16 DIAGNOSIS — G4733 Obstructive sleep apnea (adult) (pediatric): Secondary | ICD-10-CM

## 2017-04-16 DIAGNOSIS — Z7984 Long term (current) use of oral hypoglycemic drugs: Secondary | ICD-10-CM | POA: Diagnosis not present

## 2017-04-16 DIAGNOSIS — K5900 Constipation, unspecified: Secondary | ICD-10-CM | POA: Diagnosis not present

## 2017-04-16 DIAGNOSIS — I340 Nonrheumatic mitral (valve) insufficiency: Secondary | ICD-10-CM | POA: Diagnosis not present

## 2017-04-16 DIAGNOSIS — R918 Other nonspecific abnormal finding of lung field: Secondary | ICD-10-CM

## 2017-04-16 DIAGNOSIS — J849 Interstitial pulmonary disease, unspecified: Secondary | ICD-10-CM | POA: Diagnosis present

## 2017-04-16 DIAGNOSIS — Z79899 Other long term (current) drug therapy: Secondary | ICD-10-CM | POA: Diagnosis not present

## 2017-04-16 DIAGNOSIS — R05 Cough: Secondary | ICD-10-CM | POA: Diagnosis present

## 2017-04-16 DIAGNOSIS — Z6841 Body Mass Index (BMI) 40.0 and over, adult: Secondary | ICD-10-CM | POA: Diagnosis not present

## 2017-04-16 DIAGNOSIS — L409 Psoriasis, unspecified: Secondary | ICD-10-CM | POA: Diagnosis not present

## 2017-04-16 DIAGNOSIS — Z87891 Personal history of nicotine dependence: Secondary | ICD-10-CM | POA: Diagnosis not present

## 2017-04-16 DIAGNOSIS — J45909 Unspecified asthma, uncomplicated: Secondary | ICD-10-CM | POA: Diagnosis not present

## 2017-04-16 DIAGNOSIS — E119 Type 2 diabetes mellitus without complications: Secondary | ICD-10-CM | POA: Diagnosis not present

## 2017-04-16 DIAGNOSIS — R0609 Other forms of dyspnea: Secondary | ICD-10-CM

## 2017-04-16 LAB — RAPID URINE DRUG SCREEN, HOSP PERFORMED
Amphetamines: NOT DETECTED
Barbiturates: NOT DETECTED
Benzodiazepines: NOT DETECTED
Cocaine: NOT DETECTED
Opiates: POSITIVE — AB
Tetrahydrocannabinol: NOT DETECTED

## 2017-04-16 LAB — PREGNANCY, URINE: Preg Test, Ur: NEGATIVE

## 2017-04-16 LAB — CBC WITH DIFFERENTIAL/PLATELET
Basophils Absolute: 0.1 10*3/uL (ref 0.0–0.1)
Basophils Relative: 1 %
Eosinophils Absolute: 0.8 10*3/uL — ABNORMAL HIGH (ref 0.0–0.7)
Eosinophils Relative: 7 %
HEMATOCRIT: 35.6 % — AB (ref 36.0–46.0)
Hemoglobin: 12 g/dL (ref 12.0–15.0)
Lymphocytes Relative: 24 %
Lymphs Abs: 2.6 10*3/uL (ref 0.7–4.0)
MCH: 26.9 pg (ref 26.0–34.0)
MCHC: 33.7 g/dL (ref 30.0–36.0)
MCV: 79.8 fL (ref 78.0–100.0)
MONO ABS: 1.2 10*3/uL — AB (ref 0.1–1.0)
Monocytes Relative: 11 %
Neutro Abs: 6.4 10*3/uL (ref 1.7–7.7)
Neutrophils Relative %: 59 %
Platelets: 292 10*3/uL (ref 150–400)
RBC: 4.46 MIL/uL (ref 3.87–5.11)
RDW: 13.3 % (ref 11.5–15.5)
WBC: 11 10*3/uL — ABNORMAL HIGH (ref 4.0–10.5)

## 2017-04-16 LAB — BASIC METABOLIC PANEL
Anion gap: 10 (ref 5–15)
BUN: 12 mg/dL (ref 6–20)
CHLORIDE: 106 mmol/L (ref 101–111)
CO2: 24 mmol/L (ref 22–32)
Calcium: 9.2 mg/dL (ref 8.9–10.3)
Creatinine, Ser: 0.67 mg/dL (ref 0.44–1.00)
GFR calc Af Amer: >60 mL/min (ref >60)
GFR calc non Af Amer: >60 mL/min (ref >60)
GLUCOSE: 176 mg/dL — AB (ref 65–99)
POTASSIUM: 3.6 mmol/L (ref 3.5–5.1)
Sodium: 140 mmol/L (ref 135–145)

## 2017-04-16 LAB — GLUCOSE, CAPILLARY
GLUCOSE-CAPILLARY: 136 mg/dL — AB (ref 65–99)
Glucose-Capillary: 141 mg/dL — ABNORMAL HIGH (ref 65–99)
Glucose-Capillary: 154 mg/dL — ABNORMAL HIGH (ref 65–99)
Glucose-Capillary: 114 mg/dL — ABNORMAL HIGH (ref 65–99)
Glucose-Capillary: 107 mg/dL — ABNORMAL HIGH (ref 65–99)

## 2017-04-16 LAB — SEDIMENTATION RATE: Sed Rate: 30 mm/hr — ABNORMAL HIGH (ref 0–22)

## 2017-04-16 LAB — HEPATIC FUNCTION PANEL
ALBUMIN: 3.2 g/dL — AB (ref 3.5–5.0)
ALT: 18 U/L (ref 14–54)
ALT: 18 U/L (ref 14–54)
AST: 28 U/L (ref 15–41)
AST: 31 U/L (ref 15–41)
Albumin: 3.1 g/dL — ABNORMAL LOW (ref 3.5–5.0)
Alkaline Phosphatase: 94 U/L (ref 38–126)
Alkaline Phosphatase: 95 U/L (ref 38–126)
BILIRUBIN DIRECT: 0.1 mg/dL (ref 0.1–0.5)
BILIRUBIN DIRECT: 0.1 mg/dL (ref 0.1–0.5)
BILIRUBIN INDIRECT: 0.5 mg/dL (ref 0.3–0.9)
BILIRUBIN INDIRECT: 0.5 mg/dL (ref 0.3–0.9)
BILIRUBIN TOTAL: 0.6 mg/dL (ref 0.3–1.2)
TOTAL PROTEIN: 7.2 g/dL (ref 6.5–8.1)
Total Bilirubin: 0.6 mg/dL (ref 0.3–1.2)
Total Protein: 7.1 g/dL (ref 6.5–8.1)

## 2017-04-16 LAB — CREATININE, SERUM
CREATININE: 0.64 mg/dL (ref 0.44–1.00)
GFR calc Af Amer: >60 mL/min (ref >60)

## 2017-04-16 LAB — CBC
HCT: 35.8 % — ABNORMAL LOW (ref 36.0–46.0)
Hemoglobin: 11.7 g/dL — ABNORMAL LOW (ref 12.0–15.0)
MCH: 25.9 pg — ABNORMAL LOW (ref 26.0–34.0)
MCHC: 32.7 g/dL (ref 30.0–36.0)
MCV: 79.2 fL (ref 78.0–100.0)
Platelets: 301 10*3/uL (ref 150–400)
RBC: 4.52 MIL/uL (ref 3.87–5.11)
RDW: 13.3 % (ref 11.5–15.5)
WBC: 10.1 10*3/uL (ref 4.0–10.5)

## 2017-04-16 LAB — PROTIME-INR
INR: 1.02
INR: 1.01
PROTHROMBIN TIME: 13.4 s (ref 11.4–15.2)
PROTHROMBIN TIME: 13.3 s (ref 11.4–15.2)

## 2017-04-16 LAB — APTT
APTT: 37 s — AB (ref 24–36)
aPTT: 36 seconds (ref 24–36)

## 2017-04-16 LAB — PROCALCITONIN

## 2017-04-16 LAB — C-REACTIVE PROTEIN: CRP: 3 mg/dL — ABNORMAL HIGH (ref <1.0)

## 2017-04-16 LAB — CRYPTOCOCCAL ANTIGEN: CRYPTO AG: NEGATIVE

## 2017-04-16 LAB — MRSA PCR SCREENING: MRSA by PCR: NEGATIVE

## 2017-04-16 LAB — ECHOCARDIOGRAM COMPLETE: Height: 62 in

## 2017-04-16 MED ORDER — ONDANSETRON HCL 4 MG/2ML IJ SOLN
4.0000 mg | Freq: Four times a day (QID) | INTRAMUSCULAR | Status: DC | PRN
  Administered 2017-04-17 – 2017-04-18 (×2): 4 mg via INTRAVENOUS
  Filled 2017-04-16 (×2): qty 2

## 2017-04-16 MED ORDER — SODIUM CHLORIDE 0.9% FLUSH
3.0000 mL | Freq: Two times a day (BID) | INTRAVENOUS | Status: DC
  Administered 2017-04-16 – 2017-04-18 (×5): 3 mL via INTRAVENOUS

## 2017-04-16 MED ORDER — ENOXAPARIN SODIUM 40 MG/0.4ML ~~LOC~~ SOLN
40.0000 mg | SUBCUTANEOUS | Status: DC
  Administered 2017-04-17: 40 mg via SUBCUTANEOUS
  Filled 2017-04-16 (×2): qty 0.4

## 2017-04-16 MED ORDER — ALBUTEROL SULFATE (2.5 MG/3ML) 0.083% IN NEBU: 2.5000 mg | INHALATION_SOLUTION | RESPIRATORY_TRACT | Status: DC | PRN

## 2017-04-16 MED ORDER — ONDANSETRON HCL 4 MG PO TABS
4.0000 mg | ORAL_TABLET | Freq: Four times a day (QID) | ORAL | Status: DC | PRN
  Administered 2017-04-16: 4 mg via ORAL
  Filled 2017-04-16: qty 1

## 2017-04-16 MED ORDER — HYDROCODONE-ACETAMINOPHEN 5-325 MG PO TABS
1.0000 | ORAL_TABLET | ORAL | Status: DC | PRN
  Administered 2017-04-16 (×2): 1 via ORAL
  Administered 2017-04-16: 2 via ORAL
  Administered 2017-04-17: 1 via ORAL
  Administered 2017-04-17: 2 via ORAL
  Administered 2017-04-17: 1 via ORAL
  Filled 2017-04-16 (×2): qty 2
  Filled 2017-04-16 (×3): qty 1
  Filled 2017-04-16: qty 2
  Filled 2017-04-16: qty 1

## 2017-04-16 MED ORDER — INSULIN ASPART 100 UNIT/ML ~~LOC~~ SOLN
0.0000 [IU] | Freq: Three times a day (TID) | SUBCUTANEOUS | Status: DC
  Administered 2017-04-16: 2 [IU] via SUBCUTANEOUS
  Administered 2017-04-16 – 2017-04-17 (×3): 1 [IU] via SUBCUTANEOUS

## 2017-04-16 MED ORDER — SODIUM CHLORIDE 0.9% FLUSH
3.0000 mL | INTRAVENOUS | Status: DC | PRN
Start: 2017-04-16 — End: 2017-04-19

## 2017-04-16 MED ORDER — SIMVASTATIN 20 MG PO TABS
20.0000 mg | ORAL_TABLET | Freq: Every day | ORAL | Status: DC
  Administered 2017-04-16: 20 mg via ORAL
  Filled 2017-04-16 (×2): qty 1

## 2017-04-16 MED ORDER — ENOXAPARIN SODIUM 40 MG/0.4ML ~~LOC~~ SOLN
40.0000 mg | SUBCUTANEOUS | Status: DC
  Administered 2017-04-16: 40 mg via SUBCUTANEOUS
  Filled 2017-04-16 (×2): qty 0.4

## 2017-04-16 MED ORDER — NORGESTIM-ETH ESTRAD TRIPHASIC 0.18/0.215/0.25 MG-35 MCG PO TABS
1.0000 | ORAL_TABLET | Freq: Every day | ORAL | Status: DC
  Administered 2017-04-17 – 2017-04-19 (×3): 1 via ORAL

## 2017-04-16 MED ORDER — SODIUM CHLORIDE 0.9 % IV SOLN: 250.0000 mL | INTRAVENOUS | Status: DC | PRN

## 2017-04-16 NOTE — Progress Notes (Signed)
New Admission Note:  Arrival Method: Stretcher via [facility] Mental Orientation: A&O x4 Telemetry: N/A Assessment: Completed Skin: Assessed with Charito, RN, check flowsheets IV: R FA, saline locked Pain: 5/10, pain medication administered Tubes: N/A Safety Measures: Safety Fall Prevention Plan was given, discussed and signed by patient Admission: Completed [facility] Orientation: Patient has been orientated to the room, unit and the staff. Family: None at bedside  Orders have been reviewed and implemented. Will continue to monitor the patient. Call light has been placed within reach.  Nena Polio BSN, RN  Phone Number: [phone]

## 2017-04-16 NOTE — ED Notes (Signed)
Report given to [facility] and Joaquim Lai, RN on 6E; pt transported to [facility] at this time, personal belongings in possession of pt

## 2017-04-16 NOTE — Progress Notes (Signed)
PROGRESS NOTE                                                                                                                                                                                                             Patient Demographics:    Denise Barnett, is a 46 y.o. female, DOB - 1971-07-07, PQD:[id number]  Admit date - 04/15/2017   Admitting Physician Ivor Costa, MD  Outpatient Primary MD for the patient is Koirala, Dibas, MD  LOS - 0  Chief Complaint  Patient presents with  . Cough       Brief Narrative Denise Barnett  is a 46 y.o. female, With history of obstructive sleep apnea, diabetes mellitus who came to ED for abnormal imaging.   Subjective:    Denise Barnett today has, No headache, No chest pain, No abdominal pain - No Nausea, No new weakness tingling or numbness, No Cough - SOB.    Assessment  & Plan :     1. Multiple bilateral nodular opacities noted on CT scan. Patient on Humira and relatively symptom-free. Pulmonary has been consulted, she has no night sweats, unintentional weight loss, appetite is good. Could be an atypical fungal infection. Defer further workup to pulmonary. May require bronchoscopy or fungal assay. Also blood cultures and echocardiogram ordered which are pending.  2. History of psoriasis. On Humira. Outpatient dermatology follow-up may need to switch medications.  3. DM type II. Sliding scale for now. Hold metformin.  CBG (last 3)   Recent Labs  04/16/17 0640 04/16/17 0733  GLUCAP 141* 154*      Diet : Diet regular Room service appropriate? Yes; Fluid consistency: Thin    Family Communication  :  None  Code Status :  Full  Disposition Plan  :  TBD  Consults  :  Pulmonary  Procedures  :    CT chest - Multiple bilateral nodular opacities  Echocardiogram   DVT Prophylaxis  :  Lovenox    Lab Results  Component Value Date   PLT 292 04/16/2017    Inpatient  Medications  Scheduled Meds: . enoxaparin (LOVENOX) injection  40 mg Subcutaneous Q24H  . insulin aspart  0-9 Units Subcutaneous TID WC  . Norgestimate-Ethinyl Estradiol Triphasic  1 tablet Oral Daily  . simvastatin  20 mg Oral q1800  . sodium chloride flush  3 mL Intravenous Q12H   Continuous Infusions: . sodium chloride     PRN Meds:.sodium chloride, albuterol, HYDROcodone-acetaminophen, ondansetron **OR** ondansetron (ZOFRAN) IV, sodium chloride flush  Antibiotics  :    Anti-infectives    None         Objective:   Vitals:   04/16/17 0502 04/16/17 0530 04/16/17 0642 04/16/17 0912  BP:  124/71 (!) 145/86 124/70  Pulse: 82  91 84  Resp: 18  17 18   Temp: 98.1 F (36.7 C)  98.7 F (37.1 C) 98.7 F (37.1 C)  TempSrc: Oral   Oral  SpO2: 98%  97% 98%  Height:   5\' 2"  (1.575 m)     Wt Readings from Last 3 Encounters:  03/11/17 99.3 kg (219 lb)  02/09/17 99.3 kg (219 lb)  12/25/15 99.8 kg (220 lb)     Intake/Output Summary (Last 24 hours) at 04/16/17 1149 Last data filed at 04/16/17 0900  Gross per 24 hour  Intake                0 ml  Output                0 ml  Net                0 ml     Physical Exam  Awake Alert, Oriented X 3, No new F.N deficits, Normal affect [place].AT,PERRAL Supple Neck,No JVD, No cervical lymphadenopathy appriciated.  Symmetrical Chest wall movement, Good air movement bilaterally, CTAB RRR,No Gallops,Rubs or new Murmurs, No Parasternal Heave +ve B.Sounds, Abd Soft, No tenderness, No organomegaly appriciated, No rebound - guarding or rigidity. No Cyanosis, Clubbing or edema, No new Rash or bruise       Data Review:    CBC  Recent Labs Lab 04/16/17 0202  WBC 11.0*  HGB 12.0  HCT 35.6*  PLT 292  MCV 79.8  MCH 26.9  MCHC 33.7  RDW 13.3  LYMPHSABS 2.6  MONOABS 1.2*  EOSABS 0.8*  BASOSABS 0.1    Chemistries   Recent Labs Lab 04/16/17 0202  NA 140  K 3.6  CL 106  CO2 24  GLUCOSE 176*  BUN 12  CREATININE 0.67    CALCIUM 9.2   ------------------------------------------------------------------------------------------------------------------ No results for input(s): CHOL, HDL, LDLCALC, TRIG, CHOLHDL, LDLDIRECT in the last 72 hours.  No results found for: HGBA1C ------------------------------------------------------------------------------------------------------------------ No results for input(s): TSH, T4TOTAL, T3FREE, THYROIDAB in the last 72 hours.  Invalid input(s): FREET3 ------------------------------------------------------------------------------------------------------------------ No results for input(s): VITAMINB12, FOLATE, FERRITIN, TIBC, IRON, RETICCTPCT in the last 72 hours.  Coagulation profile No results for input(s): INR, PROTIME in the last 168 hours.  No results for input(s): DDIMER in the last 72 hours.  Cardiac Enzymes No results for input(s): CKMB, TROPONINI, MYOGLOBIN in the last 168 hours.  Invalid input(s): CK ------------------------------------------------------------------------------------------------------------------ No results found for: BNP  Micro Results Recent Results (from the past 240 hour(s))  Culture, blood (Routine X 2) w Reflex to ID Panel     Status: None (Preliminary result)   Collection Time: 04/16/17  1:57 AM  Result Value Ref Range Status   Specimen Description RIGHT ANTECUBITAL  Final   Special Requests   Final    BOTTLES DRAWN AEROBIC AND ANAEROBIC Blood Culture results may not be optimal due to an inadequate volume of blood received in culture bottles   Culture NO GROWTH < 12 HOURS  Final   Report Status PENDING  Incomplete  Culture, blood (Routine X 2) w  Reflex to ID Panel     Status: None (Preliminary result)   Collection Time: 04/16/17  2:06 AM  Result Value Ref Range Status   Specimen Description BLOOD RIGHT ARM  Final   Special Requests   Final    BOTTLES DRAWN AEROBIC AND ANAEROBIC Blood Culture results may not be optimal due to an  inadequate volume of blood received in culture bottles   Culture NO GROWTH < 12 HOURS  Final   Report Status PENDING  Incomplete    Radiology Reports Dg Chest 2 View  Result Date: 04/11/2017 CLINICAL DATA:  Slight cough, abnormal chest x-ray 11 days ago. History of asthma, diabetes, former smoker. EXAM: CHEST  2 VIEW COMPARISON:  PA and lateral chest x-ray of March 31, 2017 and March 11, 2017. FINDINGS: There remains increased density in the right middle lobe anteriorly. Slight interval increasing conspicuity of mid lung density on the right as well. The left lung is clear. There is no pleural effusion or pneumothorax. The heart and pulmonary vascularity are normal. The trachea is midline. The bony thorax exhibits no acute abnormality. IMPRESSION: Persistent increased density in the anterior aspect of the right middle lobe. Chest CT scanning is recommended to exclude occult malignancy. Electronically Signed   By: David  Martinique M.D.   On: 04/11/2017 14:43   Dg Chest 2 View  Result Date: 03/31/2017 CLINICAL DATA:  Cyst of lung. EXAM: CHEST  2 VIEW COMPARISON:  03/11/2017 FINDINGS: Low volume chest. There is patchy density at the right middle lobe, progressed. No Kerley lines, effusion, or pneumothorax. Normal heart size. No evidence of adenopathy. IMPRESSION: Progressed right middle lobe opacity. This could reflect untreated pneumonia or other acute airspace disease. Recommend close imaging follow-up in this patient on Humira. Electronically Signed   By: Monte Fantasia M.D.   On: 03/31/2017 14:34   Ct Chest W Contrast  Result Date: 04/15/2017 CLINICAL DATA:  Anterior right mid lung opacities on chest radiographs. EXAM: CT CHEST WITH CONTRAST TECHNIQUE: Multidetector CT imaging of the chest was performed during intravenous contrast administration. Creatinine was obtained on site at [facility] at [address] Results: Creatinine 0.5 mg/dL. CONTRAST:  62mL ISOVUE-300 IOPAMIDOL (ISOVUE-300)  INJECTION 61% COMPARISON:  04/11/2017 chest radiograph. FINDINGS: Cardiovascular: Normal heart size. No significant pericardial fluid/thickening. Atherosclerotic nonaneurysmal thoracic aorta. Normal caliber pulmonary arteries. No central pulmonary emboli. Mediastinum/Nodes: No discrete thyroid nodules. Unremarkable esophagus. No axillary adenopathy. Mildly enlarged 1.0 cm subcarinal node (series 3/ image 44). No additional pathologically enlarged mediastinal or hilar lymph nodes. Lungs/Pleura: No pneumothorax. No pleural effusion. There are several (greater than 5) poorly marginated masslike and nodular foci of consolidation scattered in the right greater than left lungs involving the right middle lobe and bilateral lower lobes, each demonstrating surrounding ground-glass attenuation. For example, a 3.8 x 2.3 cm masslike focus of consolidation in the anterior right middle lobe (series 4/ image 49), a 3.7 x 1.5 cm masslike focus of consolidation in the anteromedial right middle lobe (series 4/ image 59), a 2.4 x 1.9 cm medial right lower lobe nodular focus of consolidation (series 4/ image 55) and a 1.4 x 0.7 cm nodular medial left lower lobe focus of consolidation (series 4/image 45). Upper abdomen: Unremarkable. Musculoskeletal: No aggressive appearing focal osseous lesions. Scattered subcentimeter sclerotic foci in the thoracic vertebral bodies are too small to characterize and probably benign bone islands. Minimal thoracic spondylosis. IMPRESSION: 1. Several poorly marginated masslike and nodular foci of consolidation scattered in the right greater  than left lungs with surrounding ground-glass attenuation. Given the recent clear radiographic progression of these opacities between 03/11/2017 and 03/31/2017, a multilobar infection is favored, with the differential including atypical infections such as fungal pneumonia given the masslike/nodular quality of the opacities and surrounding ground-glass attenuation. Post  treatment follow-up chest CT advised in 3 months. 2. Nonspecific mild mediastinal lymphadenopathy, probably reactive. 3. Aortic atherosclerosis. Electronically Signed   By: Ilona Sorrel M.D.   On: 04/15/2017 11:29    Time Spent in minutes  30   Lala Lund M.D on 04/16/2017 at 11:49 AM  Between 7am to 7pm - Pager - [phone] ( page via [email or web address], text pages only, please mention full 10 digit call back number). After 7pm go to [email or web address] - password [facility]

## 2017-04-16 NOTE — H&P (Signed)
TRH H&P    Patient Demographics:    Denise Barnett, is a 46 y.o. female  MRN: [id number]  DOB - 1971/07/23  Admit Date - 04/15/2017  Referring MD/NP/PA: Malachy Moan  Outpatient Primary MD for the patient is Lujean Amel, MD  Patient coming from: home   Chief Complaint  Patient presents with  . Cough      HPI:    Denise Barnett  is a 46 y.o. female, With history of obstructive sleep apnea, diabetes mellitus who came to ED for abnormal imaging. Patient was initially seen on 03/11/2017 with right lateral chest pain at that time chest x-ray showed small postinfectious pneumatocyst, patient was discharged on Levaquin. She had a follow-up chest x-ray and friendly CT chest done yesterday which showed progression of the opacities visualized on 03/11/2017. Patient denies shortness of breath at this time only has been having dry cough. But admits to having right and left lateral chest pain. She also complains of dyspnea on exertion.  In the ED, lab work showed WBC 11.0, blood cultures 2 have been obtained. ED physician called and discussed with Dr. Ashok Cordia at [facility] recommend IV antibiotics at this time. He recommends getting echo and blood cultures to rule out infective endocarditis.    Review of systems:    In addition to the HPI above,  No Fever-chills, No Headache, No changes with Vision or hearing, No problems swallowing food or Liquids, No Abdominal pain, No Nausea or Vomiting, bowel movements are regular, No Blood in stool or Urine, No dysuria, No new skin rashes or bruises, No new joints pains-aches,  No new weakness, tingling, numbness in any extremity, No recent weight gain or loss, No polyuria, polydypsia or polyphagia, No significant Mental Stressors.  A full 10 point Review of Systems was done, except as stated above, all other Review of Systems were negative.   With Past  History of the following :    Past Medical History:  Diagnosis Date  . Asthma   . Bronchitis   . Diabetes mellitus without complication ([place])   . Lichen planus   . OSA (obstructive sleep apnea) 12/25/2013  . Pneumonia       Past Surgical History:  Procedure Laterality Date  . none        Social History:      Social History  Substance Use Topics  . Smoking status: Former Research scientist (life sciences)  . Smokeless tobacco: Never Used  . Alcohol use No       Family History :     Family History  Problem Relation Age of Onset  . Sleep apnea Mother   . Heart attack Brother       Home Medications:   Prior to Admission medications   Medication Sig Start Date End Date Taking? Authorizing Provider  HUMIRA PEN 40 MG/0.8ML PNKT  02/03/17   Historical Provider, MD  HYDROcodone-acetaminophen (NORCO/VICODIN) 5-325 MG tablet Take 2 tablets by mouth every 4 (four) hours as needed. 03/11/17   Fransico Meadow, PA-C  ibuprofen (ADVIL,MOTRIN) 200 MG tablet Take  400 mg by mouth daily as needed. For pain    Historical Provider, MD  levofloxacin (LEVAQUIN) 500 MG tablet Take 1 tablet (500 mg total) by mouth daily. 03/11/17   Fransico Meadow, PA-C  metFORMIN (GLUCOPHAGE) 500 MG tablet Take 500 mg by mouth 2 (two) times daily with a meal.  10/18/15   Historical Provider, MD  Norgestimate-Ethinyl Estradiol Triphasic 0.18/0.215/0.25 MG-35 MCG tablet Take 1 tablet by mouth daily. 09/23/13   Historical Provider, MD  simvastatin (ZOCOR) 20 MG tablet Take 20 mg by mouth daily. 02/01/17   Historical Provider, MD     Allergies:     Allergies  Allergen Reactions  . Bactrim [Sulfamethoxazole-Trimethoprim] Rash  . Penicillins Rash     Physical Exam:   Vitals  Blood pressure 129/66, pulse 87, resp. rate 18, last menstrual period 04/15/2017, SpO2 96 %.  1.  General: Appears in no acute distress  2. Psychiatric:  Intact judgement and  insight, awake alert, oriented x 3.  3. Neurologic: No focal neurological  deficits, all cranial nerves intact.Strength 5/5 all 4 extremities, sensation intact all 4 extremities, plantars down going.  4. Eyes :  anicteric sclerae, moist conjunctivae with no lid lag. PERRLA.  5. ENMT:  Oropharynx clear with moist mucous membranes and good dentition  6. Neck:  supple, no cervical lymphadenopathy appriciated, No thyromegaly  7. Respiratory : Normal respiratory effort, decreased breath sounds in right lung base  8. Cardiovascular : RRR, no gallops, rubs or murmurs, no leg edema  9. Gastrointestinal:  Positive bowel sounds, abdomen soft, non-tender to palpation,no hepatosplenomegaly, no rigidity or guarding       10. Skin:  No cyanosis, normal texture and turgor, no rash, lesions or ulcers  11.Musculoskeletal:  Good muscle tone,  joints appear normal , no effusions,  normal range of motion    Data Review:    CBC  Recent Labs Lab 04/16/17 0202  WBC 11.0*  HGB 12.0  HCT 35.6*  PLT 292  MCV 79.8  MCH 26.9  MCHC 33.7  RDW 13.3  LYMPHSABS 2.6  MONOABS 1.2*  EOSABS 0.8*  BASOSABS 0.1   ------------------------------------------------------------------------------------------------------------------  Chemistries   Recent Labs Lab 04/16/17 0202  NA 140  K 3.6  CL 106  CO2 24  GLUCOSE 176*  BUN 12  CREATININE 0.67  CALCIUM 9.2   ------------------------------------------------------------------------------------------------------------------  ------------------------------------------------------------------------------------------------------------------ GFR:  --------------------------------------------------------------------------------------------------------------- Urine analysis:    Component Value Date/Time   COLORURINE YELLOW 01/05/2013 0023   APPEARANCEUR HAZY (A) 01/05/2013 0023   LABSPEC 1.020 01/05/2013 0023   PHURINE 5.5 01/05/2013 0023   GLUCOSEU NEGATIVE 01/05/2013 0023   HGBUR SMALL (A) 01/05/2013 0023    BILIRUBINUR NEGATIVE 01/05/2013 0023   KETONESUR NEGATIVE 01/05/2013 0023   PROTEINUR NEGATIVE 01/05/2013 0023   UROBILINOGEN 0.2 01/05/2013 0023   NITRITE NEGATIVE 01/05/2013 0023   LEUKOCYTESUR LARGE (A) 01/05/2013 0023      Imaging Results:    Ct Chest W Contrast  Result Date: 04/15/2017 CLINICAL DATA:  Anterior right mid lung opacities on chest radiographs. EXAM: CT CHEST WITH CONTRAST TECHNIQUE: Multidetector CT imaging of the chest was performed during intravenous contrast administration. Creatinine was obtained on site at [facility] at [address] Results: Creatinine 0.5 mg/dL. CONTRAST:  42mL ISOVUE-300 IOPAMIDOL (ISOVUE-300) INJECTION 61% COMPARISON:  04/11/2017 chest radiograph. FINDINGS: Cardiovascular: Normal heart size. No significant pericardial fluid/thickening. Atherosclerotic nonaneurysmal thoracic aorta. Normal caliber pulmonary arteries. No central pulmonary emboli. Mediastinum/Nodes: No discrete thyroid nodules. Unremarkable esophagus. No axillary  adenopathy. Mildly enlarged 1.0 cm subcarinal node (series 3/ image 44). No additional pathologically enlarged mediastinal or hilar lymph nodes. Lungs/Pleura: No pneumothorax. No pleural effusion. There are several (greater than 5) poorly marginated masslike and nodular foci of consolidation scattered in the right greater than left lungs involving the right middle lobe and bilateral lower lobes, each demonstrating surrounding ground-glass attenuation. For example, a 3.8 x 2.3 cm masslike focus of consolidation in the anterior right middle lobe (series 4/ image 49), a 3.7 x 1.5 cm masslike focus of consolidation in the anteromedial right middle lobe (series 4/ image 59), a 2.4 x 1.9 cm medial right lower lobe nodular focus of consolidation (series 4/ image 55) and a 1.4 x 0.7 cm nodular medial left lower lobe focus of consolidation (series 4/image 45). Upper abdomen: Unremarkable. Musculoskeletal: No aggressive appearing  focal osseous lesions. Scattered subcentimeter sclerotic foci in the thoracic vertebral bodies are too small to characterize and probably benign bone islands. Minimal thoracic spondylosis. IMPRESSION: 1. Several poorly marginated masslike and nodular foci of consolidation scattered in the right greater than left lungs with surrounding ground-glass attenuation. Given the recent clear radiographic progression of these opacities between 03/11/2017 and 03/31/2017, a multilobar infection is favored, with the differential including atypical infections such as fungal pneumonia given the masslike/nodular quality of the opacities and surrounding ground-glass attenuation. Post treatment follow-up chest CT advised in 3 months. 2. Nonspecific mild mediastinal lymphadenopathy, probably reactive. 3. Aortic atherosclerosis. Electronically Signed   By: Ilona Sorrel M.D.   On: 04/15/2017 11:29       Assessment & Plan:    Active Problems:   Interstitial pneumonia ([place])   1. Bilateral lung masses- CT scan shows bilateral masslike nodular opacities with surrounding groundglass attenuation, patient needs to be transferred to [facility] for evaluation by pulmonary. Will obtain echocardiogram and blood cultures 2 to rule out infective endocarditis. 2. Diabetes mellitus- start sliding scale insulin with NovoLog. Hold metformin.  Dr Blaine Hamper is the accepting physician at [facility]  DVT Prophylaxis-   Lovenox   AM Labs Ordered, also please review Full Orders  Family Communication: Admission, patients condition and plan of care including tests being ordered have been discussed with the patient * who indicate understanding and agree with the plan and Code Status.  Code Status:  Full code  Admission status: Observation  Time spent in minutes : 60 minutes   Jancy Sprankle S M.D on 04/16/2017 at 4:30 AM  Between 7am to 7pm - Pager - [phone]. After 7pm go to [email or web address] - password [facility]  [facility] -  Office  [phone]

## 2017-04-16 NOTE — Consult Note (Signed)
Name: Denise Barnett MRN: [id number] DOB: 1971-07-25    ADMISSION DATE:  04/15/2017 CONSULTATION DATE:  5/5  REFERRING MD : [facility]  CHIEF COMPLAINT:  DOE  BRIEF PATIENT DESCRIPTION: WNWD female  SIGNIFICANT EVENTS  5/4 tx to [facility]  STUDIES:     HISTORY OF PRESENT ILLNESS:   46 yo former smoker(1ppd age [age]) with psoriasis and history of prednisone , methotrexate use and for last year on Humaria with 2 negative TB test. She presented 6 weeks ago to Urgent care with left sided chest pain, no FCS ore sputum production, spot seen on Cxr and she was treated for pneumonia with Levaquin. Pain spread to right side and when she was revaluated CT chest revealed bilateral opacities concerning for infectious process. Transferred for OHS to [facility] for Pulmonary evaluation. She denies toxic exposure, exotic travel or animal exposure/ She will most likely need FOB for identification of any organism. Case discussed with MD. PAST MEDICAL HISTORY :   has a past medical history of Asthma; Bronchitis; Diabetes mellitus without complication ([place]); Lichen planus; OSA (obstructive sleep apnea) (12/25/2013); and Pneumonia.  has a past surgical history that includes none. Prior to Admission medications   Medication Sig Start Date End Date Taking? Authorizing Provider  HYDROcodone-acetaminophen (NORCO/VICODIN) 5-325 MG tablet Take 2 tablets by mouth every 4 (four) hours as needed. 03/11/17  Yes Caryl Ada K, PA-C  ibuprofen (ADVIL,MOTRIN) 200 MG tablet Take 400 mg by mouth daily as needed. For pain   Yes [provider]  metFORMIN (GLUCOPHAGE) 500 MG tablet Take 500 mg by mouth 2 (two) times daily with a meal.  10/18/15  Yes [provider]  Norgestimate-Ethinyl Estradiol Triphasic 0.18/0.215/0.25 MG-35 MCG tablet Take 1 tablet by mouth daily. 09/23/13  Yes [provider]  HUMIRA PEN 40 MG/0.8ML PNKT Inject 40 mg into the skin See admin instructions. Every other week 02/03/17    [provider]  levofloxacin (LEVAQUIN) 500 MG tablet Take 1 tablet (500 mg total) by mouth daily. Patient not taking: Reported on 04/16/2017 03/11/17   Fransico Meadow, PA-C   Allergies  Allergen Reactions  . Bactrim [Sulfamethoxazole-Trimethoprim] Rash  . Penicillins Rash    FAMILY HISTORY:  family history includes Heart attack in her brother; Sleep apnea in her mother. SOCIAL HISTORY:  reports that she has quit smoking. She has never used smokeless tobacco. She reports that she does not drink alcohol or use drugs.  REVIEW OF SYSTEMS:   10 point review of system taken, please see HPI for positives and negatives.   SUBJECTIVE:  Awake and alert   VITAL SIGNS: Temp:  [98.1 F (36.7 C)-98.7 F (37.1 C)] 98.7 F (37.1 C) (05/05 0912) Pulse Rate:  [81-91] 84 (05/05 0912) Resp:  [17-18] 18 (05/05 0912) BP: (124-145)/(66-86) 124/70 (05/05 0912) SpO2:  [96 %-98 %] 98 % (05/05 0912)  PHYSICAL EXAMINATION: General:  WNWD female no acute distress Neuro:  Intact HEENT:No LAN/JVD Cardiovascular:  HSR RRR Lungs:  cta Abdomen:  Soft +bs Musculoskeletal:  intact Skin:  Bilateral feet with plaque psoriasis    Recent Labs Lab 04/16/17 0202  NA 140  K 3.6  CL 106  CO2 24  BUN 12  CREATININE 0.67  GLUCOSE 176*    Recent Labs Lab 04/16/17 0202  HGB 12.0  HCT 35.6*  WBC 11.0*  PLT 292   Ct Chest W Contrast  Result Date: 04/15/2017 CLINICAL DATA:  Anterior right mid lung opacities on chest radiographs. EXAM: CT CHEST  WITH CONTRAST TECHNIQUE: Multidetector CT imaging of the chest was performed during intravenous contrast administration. Creatinine was obtained on site at [facility] at [address] Results: Creatinine 0.5 mg/dL. CONTRAST:  54m ISOVUE-300 IOPAMIDOL (ISOVUE-300) INJECTION 61% COMPARISON:  04/11/2017 chest radiograph. FINDINGS: Cardiovascular: Normal heart size. No significant pericardial fluid/thickening. Atherosclerotic nonaneurysmal  thoracic aorta. Normal caliber pulmonary arteries. No central pulmonary emboli. Mediastinum/Nodes: No discrete thyroid nodules. Unremarkable esophagus. No axillary adenopathy. Mildly enlarged 1.0 cm subcarinal node (series 3/ image 44). No additional pathologically enlarged mediastinal or hilar lymph nodes. Lungs/Pleura: No pneumothorax. No pleural effusion. There are several (greater than 5) poorly marginated masslike and nodular foci of consolidation scattered in the right greater than left lungs involving the right middle lobe and bilateral lower lobes, each demonstrating surrounding ground-glass attenuation. For example, a 3.8 x 2.3 cm masslike focus of consolidation in the anterior right middle lobe (series 4/ image 49), a 3.7 x 1.5 cm masslike focus of consolidation in the anteromedial right middle lobe (series 4/ image 59), a 2.4 x 1.9 cm medial right lower lobe nodular focus of consolidation (series 4/ image 55) and a 1.4 x 0.7 cm nodular medial left lower lobe focus of consolidation (series 4/image 45). Upper abdomen: Unremarkable. Musculoskeletal: No aggressive appearing focal osseous lesions. Scattered subcentimeter sclerotic foci in the thoracic vertebral bodies are too small to characterize and probably benign bone islands. Minimal thoracic spondylosis. IMPRESSION: 1. Several poorly marginated masslike and nodular foci of consolidation scattered in the right greater than left lungs with surrounding ground-glass attenuation. Given the recent clear radiographic progression of these opacities between 03/11/2017 and 03/31/2017, a multilobar infection is favored, with the differential including atypical infections such as fungal pneumonia given the masslike/nodular quality of the opacities and surrounding ground-glass attenuation. Post treatment follow-up chest CT advised in 3 months. 2. Nonspecific mild mediastinal lymphadenopathy, probably reactive. 3. Aortic atherosclerosis. Electronically Signed   By:  JIlona SorrelM.D.   On: 04/15/2017 11:29    ASSESSMENT :  Dyspnea on exertion, worse over last 12 weeks   OSA (obstructive sleep apnea) uses cpap daily   Interstitial pneumonia (HCC)   Psoriasis on humria x 1 year, 2 negative TB test   Former cigarette smoker age [age]-- 391 ppd or less      Discussion: 46yo former smoker(1ppd age [age] with psoriasis and history of prednisone , methotrexate use and for last year on Humaria with 2 negative TB test. She presented 6 weeks ago to Urgent care with left sided chest pain, no FCS ore sputum production, spot seen on Cxr and she was treated for pneumonia with Levaquin. Pain spread to right side and when she was revaluated CT chest revealed bilateral opacities concerning for infectious process. [facility]. She denies toxic exposure, exotic travel or animal exposure/ She will most likely need FOB for identification of any organism. Case discussed with MD.     PLAN: FOB for evaluation and culture.     SRichardson LandryMinor ACNP LMaryanna ShapePCCM Pager [phone] 3 pm If no answer page [phone] 56355/04/2017, 9:59 AM   ATTENDING NOTE / ATTESTATION NOTE :   I have discussed the case with the resident/APP  SRichardson LandryMinor NP   I agree with the resident/APP's  history, physical examination, assessment, and plans.    I have edited the above note and modified it according to our agreed history, physical examination, assessment and plan.   Briefly,  patient with 20-pack-year smoking history, quit when she was 46 yrs old, not known to have lung disease, known to have psoriasis (lichen planus) for which she was on methotrexate which was switched to Humira the last year. No known complications from medicines. She has her PPD regularly and her PPD this year was U/R. She was on prednisone years ago.  She is a diabetic. She has sleep apnea for which she uses CPAP.   She initially presented and of March with right-sided chest pain,  dyspnea, cough, wheezing. She got levofloxacin. She got some better but not at baseline. Chest x-ray at that time showed infiltrate at the right base. As she was not at her baseline, she had serial chest x-ray which showed "worsening infiltrate".  She ended up getting a chest CT scan which showed several poorly marginated masslike and nodular foci of consolidation scattered in the right greater than left lungs with surrounding ground-glass attenuation. There seems to be a progression of infiltrate since baseline x-ray in March 2018. Given the abnormal chest CT scan, she was advised admission and pulmonary workup.  She remains well. She seemed to have recovered from that infection in March but still has some feeling unwell as well as some dyspnea. Denies fevers and chills. Denies weight changes. No chest pain. She is single, has a 61 year old son. She lives in [place]. She works in [employer] here and [employer]. No recent travel. She lives in a trailer home which is old and clean. Denies pets. No mold exposure.  Vitals:  Vitals:   04/16/17 0502 04/16/17 0530 04/16/17 0642 04/16/17 0912  BP:  124/71 (!) 145/86 124/70  Pulse: 82  91 84  Resp: 18  17 18   Temp: 98.1 F (36.7 C)  98.7 F (37.1 C) 98.7 F (37.1 C)  TempSrc: Oral   Oral  SpO2: 98%  97% 98%  Height:   5' 2"  (1.575 m) 5' 2"  (1.575 m)    Constitutional/General: morbidly obese, not in any distress  There is no height or weight on file to calculate BMI. Wt Readings from Last 3 Encounters:  03/11/17 99.3 kg (219 lb)  02/09/17 99.3 kg (219 lb)  12/25/15 99.8 kg (220 lb)    HEENT: PERLA, anicteric sclerae. (-) Oral thrush.  Neck: No masses. Midline trachea. No JVD, (-) LAD. (-) bruits appreciated.  Respiratory/Chest: Grossly normal chest. (-) deformity. (-) Accessory muscle use.  Symmetric expansion. Diminished BS on both lower lung zones. (-) wheezing, crackles, rhonchi (-) egophony  Cardiovascular: Regular rate and   rhythm, heart sounds normal, no murmur or gallops,  Trace peripheral edema  Gastrointestinal:  Normal bowel sounds. Soft, non-tender. No hepatosplenomegaly.  (-) masses.   Musculoskeletal:  Normal muscle tone.   Extremities: Grossly normal. (-) clubbing, cyanosis.  (-) edema  Skin: (-) rash,lesions seen.   Neurological/Psychiatric : sedated, intubated. CN grossly intact. (-) lateralizing signs.     CBC Recent Labs     04/16/17  0202  04/16/17  1107  WBC  11.0*  10.1  HGB  12.0  11.7*  HCT  35.6*  35.8*  PLT  292  301    Coag's No results for input(s): APTT, INR in the last 72 hours.  BMET Recent Labs     04/16/17  0202  04/16/17  1107  NA  140   --   K  3.6   --   CL  106   --   CO2  24   --  BUN  12   --   CREATININE  0.67  0.64  GLUCOSE  176*   --     Electrolytes Recent Labs     04/16/17  0202  CALCIUM  9.2    Sepsis Markers No results for input(s): PROCALCITON, O2SATVEN in the last 72 hours.  Invalid input(s): LACTICACIDVEN  ABG No results for input(s): PHART, PCO2ART, PO2ART in the last 72 hours.  Liver Enzymes No results for input(s): AST, ALT, ALKPHOS, BILITOT, ALBUMIN in the last 72 hours.  Cardiac Enzymes No results for input(s): TROPONINI, PROBNP in the last 72 hours.  Glucose Recent Labs     04/16/17  0640  04/16/17  0733  04/16/17  1316  04/16/17  1709  GLUCAP  141*  154*  114*  136*    Imaging No results found.  Assessment/Plan  Nodular infiltrates, bilateral, more bibasilar and peripheral. Differentials: Infectious given her being on Humira. Could be bacterial, septic emboli, fungal. Less likely viral. No signs and symptoms to suggest PTB.  Inflammatory given her psoriasis. Can have another underlying CTD but less likely Less likely infectious  - She will need a diagnostic bronchoscopy to rule out infection/opportunistic infection. We will keep her nothing by mouth after midnight on Sunday just in case we can do  bronchoscopy on Monday. We cannot schedule the bronchoscopy over the weekend. We will schedule bronchoscopy on Monday and determine when it will be done. - In the meantime, she remains fairly asymptomatic and is clinically stable. I will hold off on antibiotics unless she clinically worsens. - Suggest getting pro calcitonin to help US guide treatment. - We'll panculture with blood culture, fungal blood culture, MRSA swab. Will send for fungal markers as well (aspergillus/histoplasma/crytococcus) - check LFTs, UDS, pregnancy test to be thorough - will do screening CTD w/u with ANA, ESR, CRP - f/u on 2 [place] - check coags  OSA - told her to have her own cpap machine. - let her use her own cpap at [facility]    Family :Family updated at length today.  Plan extensively discussed with patient and her cousin.   Monica Becton, MD 04/16/2017, 5:53 PM [facility] Pager [phone] After 3 pm or if no answer, call [phone]

## 2017-04-17 DIAGNOSIS — J849 Interstitial pulmonary disease, unspecified: Secondary | ICD-10-CM | POA: Diagnosis present

## 2017-04-17 DIAGNOSIS — Z6841 Body Mass Index (BMI) 40.0 and over, adult: Secondary | ICD-10-CM | POA: Diagnosis not present

## 2017-04-17 DIAGNOSIS — E119 Type 2 diabetes mellitus without complications: Secondary | ICD-10-CM | POA: Diagnosis present

## 2017-04-17 DIAGNOSIS — R918 Other nonspecific abnormal finding of lung field: Secondary | ICD-10-CM | POA: Diagnosis not present

## 2017-04-17 DIAGNOSIS — R05 Cough: Secondary | ICD-10-CM | POA: Diagnosis present

## 2017-04-17 DIAGNOSIS — L409 Psoriasis, unspecified: Secondary | ICD-10-CM

## 2017-04-17 DIAGNOSIS — Z7984 Long term (current) use of oral hypoglycemic drugs: Secondary | ICD-10-CM | POA: Diagnosis not present

## 2017-04-17 DIAGNOSIS — G4733 Obstructive sleep apnea (adult) (pediatric): Secondary | ICD-10-CM | POA: Diagnosis present

## 2017-04-17 DIAGNOSIS — J189 Pneumonia, unspecified organism: Secondary | ICD-10-CM | POA: Diagnosis not present

## 2017-04-17 DIAGNOSIS — Z79899 Other long term (current) drug therapy: Secondary | ICD-10-CM | POA: Diagnosis not present

## 2017-04-17 DIAGNOSIS — K5900 Constipation, unspecified: Secondary | ICD-10-CM | POA: Diagnosis present

## 2017-04-17 DIAGNOSIS — R0609 Other forms of dyspnea: Secondary | ICD-10-CM | POA: Diagnosis not present

## 2017-04-17 DIAGNOSIS — Z87891 Personal history of nicotine dependence: Secondary | ICD-10-CM | POA: Diagnosis not present

## 2017-04-17 DIAGNOSIS — J45909 Unspecified asthma, uncomplicated: Secondary | ICD-10-CM | POA: Diagnosis present

## 2017-04-17 LAB — GLUCOSE, CAPILLARY
GLUCOSE-CAPILLARY: 149 mg/dL — AB (ref 65–99)
GLUCOSE-CAPILLARY: 97 mg/dL (ref 65–99)
Glucose-Capillary: 146 mg/dL — ABNORMAL HIGH (ref 65–99)
Glucose-Capillary: 146 mg/dL — ABNORMAL HIGH (ref 65–99)

## 2017-04-17 LAB — HIV ANTIBODY (ROUTINE TESTING W REFLEX): HIV SCREEN 4TH GENERATION: NONREACTIVE

## 2017-04-17 NOTE — Progress Notes (Signed)
PROGRESS NOTE                                                                                                                                                                                                             Patient Demographics:    Denise Barnett, is a 46 y.o. female, DOB - 1971/01/05, KCM:[id number]  Admit date - 04/15/2017   Admitting Physician Ivor Costa, MD  Outpatient Primary MD for the patient is Koirala, Dibas, MD  LOS - 0  Chief Complaint  Patient presents with  . Cough       Brief Narrative Denise Barnett  is a 46 y.o. female, With history of obstructive sleep apnea, diabetes mellitus who came to ED for abnormal imaging.   Subjective:   Patient in bed, appears comfortable, denies any headache, no fever, no chest pain or pressure, no shortness of breath , no abdominal pain. No focal weakness.   Assessment  & Plan :     1. Multiple bilateral nodular opacities noted on CT scan. Patient on Humira and relatively symptom-free. Pulmonary has been consulted, she has no night sweats, unintentional weight loss, appetite is good. TTE stable, Pulmonary was consulted, autoimmune serology, fungal titers & blood cultures are pending, she is due for bronchoscopy on Monday for further testing.  2. History of psoriasis. On Humira. Outpatient dermatology follow-up may need to switch medications.  3. Morbid obesity and obstructive sleep apnea. Follow with PCP for weight loss. CPAP daily at bedtime continue.   4. DM type II. Sliding scale for now. Hold metformin.  CBG (last 3)   Recent Labs  04/16/17 1709 04/16/17 2120 04/17/17 0742  GLUCAP 136* 107* 149*      Diet : Diet regular Room service appropriate? Yes; Fluid consistency: Thin Diet NPO time specified    Family Communication  :  None  Code Status :  Full  Disposition Plan  :  TBD  Consults  :  Pulmonary  Procedures  :    CT chest - Multiple  bilateral nodular opacities  Echocardiogram - Left ventricle: The cavity size was normal. Wall thickness was normal. Systolic function was normal. The estimated ejection fraction was in the range of 60% to 65%. Wall motion was normal; there were no regional wall motion abnormalities. Left  ventricular diastolic function parameters were normal. - Aortic  valve: Valve area (VTI): 2.09 cm^2. Valve area (Vmax): 2.19 cm^2. Valve area (Vmean): 1.95 cm^2. - Mitral valve: There was mild regurgitation. - Atrial septum: No defect or patent foramen ovale was identified.  Bronchoscopy -    DVT Prophylaxis  :  Lovenox    Lab Results  Component Value Date   PLT 301 04/16/2017    Inpatient Medications  Scheduled Meds: . enoxaparin (LOVENOX) injection  40 mg Subcutaneous Q24H  . insulin aspart  0-9 Units Subcutaneous TID WC  . Norgestimate-Ethinyl Estradiol Triphasic  1 tablet Oral Daily  . simvastatin  20 mg Oral q1800  . sodium chloride flush  3 mL Intravenous Q12H   Continuous Infusions: . sodium chloride     PRN Meds:.sodium chloride, albuterol, HYDROcodone-acetaminophen, ondansetron **OR** ondansetron (ZOFRAN) IV, sodium chloride flush  Antibiotics  :    Anti-infectives    None         Objective:   Vitals:   04/16/17 0912 04/16/17 1831 04/16/17 2121 04/17/17 0502  BP: 124/70 (!) 134/52 (!) 147/78 (!) 115/99  Pulse: 84 86 87 82  Resp: 18 18 19 18   Temp: 98.7 F (37.1 C) 98.8 F (37.1 C) 97.3 F (36.3 C) 97.4 F (36.3 C)  TempSrc: Oral Oral Oral Oral  SpO2: 98% 98% 94% 100%  Weight:   97.2 kg (214 lb 3.2 oz)   Height: 5\' 2"  (8.338 m)       Wt Readings from Last 3 Encounters:  04/16/17 97.2 kg (214 lb 3.2 oz)  03/11/17 99.3 kg (219 lb)  02/09/17 99.3 kg (219 lb)     Intake/Output Summary (Last 24 hours) at 04/17/17 0918 Last data filed at 04/17/17 0700  Gross per 24 hour  Intake             1502 ml  Output                0 ml  Net             1502 ml      Physical Exam Awake Alert, Oriented X 3, No new F.N deficits, Normal affect [place].AT,PERRAL Supple Neck,No JVD, No cervical lymphadenopathy appriciated.  Symmetrical Chest wall movement, Good air movement bilaterally, CTAB RRR,No Gallops,Rubs or new Murmurs, No Parasternal Heave +ve B.Sounds, Abd Soft, No tenderness, No organomegaly appriciated, No rebound - guarding or rigidity. No Cyanosis, Clubbing or edema, No new Rash or bruise    Data Review:    CBC  Recent Labs Lab 04/16/17 0202 04/16/17 1107  WBC 11.0* 10.1  HGB 12.0 11.7*  HCT 35.6* 35.8*  PLT 292 301  MCV 79.8 79.2  MCH 26.9 25.9*  MCHC 33.7 32.7  RDW 13.3 13.3  LYMPHSABS 2.6  --   MONOABS 1.2*  --   EOSABS 0.8*  --   BASOSABS 0.1  --     Chemistries   Recent Labs Lab 04/16/17 0202 04/16/17 1107 04/16/17 1556 04/16/17 1821  NA 140  --   --   --   K 3.6  --   --   --   CL 106  --   --   --   CO2 24  --   --   --   GLUCOSE 176*  --   --   --   BUN 12  --   --   --   CREATININE 0.67 0.64  --   --   CALCIUM 9.2  --   --   --  AST  --   --  28 31  ALT  --   --  18 18  ALKPHOS  --   --  94 95  BILITOT  --   --  0.6 0.6   ------------------------------------------------------------------------------------------------------------------ No results for input(s): CHOL, HDL, LDLCALC, TRIG, CHOLHDL, LDLDIRECT in the last 72 hours.  No results found for: HGBA1C ------------------------------------------------------------------------------------------------------------------ No results for input(s): TSH, T4TOTAL, T3FREE, THYROIDAB in the last 72 hours.  Invalid input(s): FREET3 ------------------------------------------------------------------------------------------------------------------ No results for input(s): VITAMINB12, FOLATE, FERRITIN, TIBC, IRON, RETICCTPCT in the last 72 hours.  Coagulation profile  Recent Labs Lab 04/16/17 1556 04/16/17 1821  INR 1.02 1.01    No results for  input(s): DDIMER in the last 72 hours.  Cardiac Enzymes No results for input(s): CKMB, TROPONINI, MYOGLOBIN in the last 168 hours.  Invalid input(s): CK ------------------------------------------------------------------------------------------------------------------ No results found for: BNP  Micro Results Recent Results (from the past 240 hour(s))  Culture, blood (Routine X 2) w Reflex to ID Panel     Status: None (Preliminary result)   Collection Time: 04/16/17  1:57 AM  Result Value Ref Range Status   Specimen Description RIGHT ANTECUBITAL  Final   Special Requests   Final    BOTTLES DRAWN AEROBIC AND ANAEROBIC Blood Culture results may not be optimal due to an inadequate volume of blood received in culture bottles   Culture NO GROWTH < 12 HOURS  Final   Report Status PENDING  Incomplete  Culture, blood (Routine X 2) w Reflex to ID Panel     Status: None (Preliminary result)   Collection Time: 04/16/17  2:06 AM  Result Value Ref Range Status   Specimen Description BLOOD RIGHT ARM  Final   Special Requests   Final    BOTTLES DRAWN AEROBIC AND ANAEROBIC Blood Culture results may not be optimal due to an inadequate volume of blood received in culture bottles   Culture NO GROWTH < 12 HOURS  Final   Report Status PENDING  Incomplete  MRSA PCR Screening     Status: None   Collection Time: 04/16/17  1:39 PM  Result Value Ref Range Status   MRSA by PCR NEGATIVE NEGATIVE Final    Comment:        The GeneXpert MRSA Assay (FDA approved for NASAL specimens only), is one component of a comprehensive MRSA colonization surveillance program. It is not intended to diagnose MRSA infection nor to guide or monitor treatment for MRSA infections.     Radiology Reports Dg Chest 2 View  Result Date: 04/11/2017 CLINICAL DATA:  Slight cough, abnormal chest x-ray 11 days ago. History of asthma, diabetes, former smoker. EXAM: CHEST  2 VIEW COMPARISON:  PA and lateral chest x-ray of March 31, 2017 and March 11, 2017. FINDINGS: There remains increased density in the right middle lobe anteriorly. Slight interval increasing conspicuity of mid lung density on the right as well. The left lung is clear. There is no pleural effusion or pneumothorax. The heart and pulmonary vascularity are normal. The trachea is midline. The bony thorax exhibits no acute abnormality. IMPRESSION: Persistent increased density in the anterior aspect of the right middle lobe. Chest CT scanning is recommended to exclude occult malignancy. Electronically Signed   By: David  Martinique M.D.   On: 04/11/2017 14:43   Dg Chest 2 View  Result Date: 03/31/2017 CLINICAL DATA:  Cyst of lung. EXAM: CHEST  2 VIEW COMPARISON:  03/11/2017 FINDINGS: Low volume chest. There is patchy density at the  right middle lobe, progressed. No Kerley lines, effusion, or pneumothorax. Normal heart size. No evidence of adenopathy. IMPRESSION: Progressed right middle lobe opacity. This could reflect untreated pneumonia or other acute airspace disease. Recommend close imaging follow-up in this patient on Humira. Electronically Signed   By: Monte Fantasia M.D.   On: 03/31/2017 14:34   Ct Chest W Contrast  Result Date: 04/15/2017 CLINICAL DATA:  Anterior right mid lung opacities on chest radiographs. EXAM: CT CHEST WITH CONTRAST TECHNIQUE: Multidetector CT imaging of the chest was performed during intravenous contrast administration. Creatinine was obtained on site at [facility] at [address] Results: Creatinine 0.5 mg/dL. CONTRAST:  81mL ISOVUE-300 IOPAMIDOL (ISOVUE-300) INJECTION 61% COMPARISON:  04/11/2017 chest radiograph. FINDINGS: Cardiovascular: Normal heart size. No significant pericardial fluid/thickening. Atherosclerotic nonaneurysmal thoracic aorta. Normal caliber pulmonary arteries. No central pulmonary emboli. Mediastinum/Nodes: No discrete thyroid nodules. Unremarkable esophagus. No axillary adenopathy. Mildly enlarged 1.0 cm  subcarinal node (series 3/ image 44). No additional pathologically enlarged mediastinal or hilar lymph nodes. Lungs/Pleura: No pneumothorax. No pleural effusion. There are several (greater than 5) poorly marginated masslike and nodular foci of consolidation scattered in the right greater than left lungs involving the right middle lobe and bilateral lower lobes, each demonstrating surrounding ground-glass attenuation. For example, a 3.8 x 2.3 cm masslike focus of consolidation in the anterior right middle lobe (series 4/ image 49), a 3.7 x 1.5 cm masslike focus of consolidation in the anteromedial right middle lobe (series 4/ image 59), a 2.4 x 1.9 cm medial right lower lobe nodular focus of consolidation (series 4/ image 55) and a 1.4 x 0.7 cm nodular medial left lower lobe focus of consolidation (series 4/image 45). Upper abdomen: Unremarkable. Musculoskeletal: No aggressive appearing focal osseous lesions. Scattered subcentimeter sclerotic foci in the thoracic vertebral bodies are too small to characterize and probably benign bone islands. Minimal thoracic spondylosis. IMPRESSION: 1. Several poorly marginated masslike and nodular foci of consolidation scattered in the right greater than left lungs with surrounding ground-glass attenuation. Given the recent clear radiographic progression of these opacities between 03/11/2017 and 03/31/2017, a multilobar infection is favored, with the differential including atypical infections such as fungal pneumonia given the masslike/nodular quality of the opacities and surrounding ground-glass attenuation. Post treatment follow-up chest CT advised in 3 months. 2. Nonspecific mild mediastinal lymphadenopathy, probably reactive. 3. Aortic atherosclerosis. Electronically Signed   By: Ilona Sorrel M.D.   On: 04/15/2017 11:29    Time Spent in minutes  30   Lala Lund M.D on 04/17/2017 at 9:18 AM  Between 7am to 7pm - Pager - [phone] ( page via [email or web address], text pages  only, please mention full 10 digit call back number). After 7pm go to [email or web address] - password [email or web address]

## 2017-04-18 ENCOUNTER — Encounter (HOSPITAL_COMMUNITY): Payer: Self-pay | Admitting: Pulmonary Disease

## 2017-04-18 ENCOUNTER — Encounter (HOSPITAL_COMMUNITY)
Admission: EM | Disposition: A | Discharge status: Home / Self Care | Payer: Self-pay | Source: Home / Self Care | Attending: Internal Medicine

## 2017-04-18 ENCOUNTER — Inpatient Hospital Stay (HOSPITAL_COMMUNITY): Payer: Managed Care, Other (non HMO)

## 2017-04-18 DIAGNOSIS — J849 Interstitial pulmonary disease, unspecified: Principal | ICD-10-CM

## 2017-04-18 DIAGNOSIS — Z87891 Personal history of nicotine dependence: Secondary | ICD-10-CM

## 2017-04-18 DIAGNOSIS — J189 Pneumonia, unspecified organism: Secondary | ICD-10-CM

## 2017-04-18 HISTORY — PX: VIDEO BRONCHOSCOPY: SHX5072

## 2017-04-18 LAB — GLUCOSE, CAPILLARY
GLUCOSE-CAPILLARY: 138 mg/dL — AB (ref 65–99)
Glucose-Capillary: 114 mg/dL — ABNORMAL HIGH (ref 65–99)
Glucose-Capillary: 96 mg/dL (ref 65–99)

## 2017-04-18 LAB — HEMOGLOBIN A1C
HEMOGLOBIN A1C: 6 % — AB (ref 4.8–5.6)
MEAN PLASMA GLUCOSE: 126 mg/dL

## 2017-04-18 LAB — ANTINUCLEAR ANTIBODIES, IFA: ANA Ab, IFA: NEGATIVE

## 2017-04-18 LAB — PROCALCITONIN: Procalcitonin: <0.1 ng/mL

## 2017-04-18 SURGERY — VIDEO BRONCHOSCOPY WITHOUT FLUORO
Anesthesia: Moderate Sedation | Laterality: Bilateral

## 2017-04-18 MED ORDER — SODIUM CHLORIDE 0.9 % IV SOLN: INTRAVENOUS | Status: DC

## 2017-04-18 MED ORDER — ONDANSETRON HCL 4 MG/2ML IJ SOLN
INTRAMUSCULAR | Status: AC
  Filled 2017-04-18: qty 2

## 2017-04-18 MED ORDER — MIDAZOLAM HCL 5 MG/ML IJ SOLN
INTRAMUSCULAR | Status: AC
  Filled 2017-04-18: qty 2

## 2017-04-18 MED ORDER — BISACODYL 10 MG RE SUPP
10.0000 mg | Freq: Every day | RECTAL | Status: DC
  Filled 2017-04-18: qty 1

## 2017-04-18 MED ORDER — PHENYLEPHRINE HCL 0.25 % NA SOLN
NASAL | Status: DC | PRN
  Administered 2017-04-18: 2 via NASAL

## 2017-04-18 MED ORDER — ALUM & MAG HYDROXIDE-SIMETH 200-200-20 MG/5ML PO SUSP
30.0000 mL | Freq: Two times a day (BID) | ORAL | Status: DC
  Filled 2017-04-18 (×2): qty 30

## 2017-04-18 MED ORDER — FENTANYL CITRATE (PF) 100 MCG/2ML IJ SOLN
INTRAMUSCULAR | Status: DC | PRN
  Administered 2017-04-18 (×2): 50 ug via INTRAVENOUS

## 2017-04-18 MED ORDER — IBUPROFEN 400 MG PO TABS
400.0000 mg | ORAL_TABLET | Freq: Four times a day (QID) | ORAL | Status: DC | PRN
  Administered 2017-04-18 (×2): 400 mg via ORAL
  Filled 2017-04-18 (×2): qty 1

## 2017-04-18 MED ORDER — MIDAZOLAM HCL 10 MG/2ML IJ SOLN
INTRAMUSCULAR | Status: DC | PRN
  Administered 2017-04-18 (×2): 1 mg via INTRAVENOUS

## 2017-04-18 MED ORDER — ALBUTEROL SULFATE (2.5 MG/3ML) 0.083% IN NEBU: 2.5000 mg | INHALATION_SOLUTION | Freq: Once | RESPIRATORY_TRACT | Status: DC

## 2017-04-18 MED ORDER — PANTOPRAZOLE SODIUM 40 MG PO TBEC
40.0000 mg | DELAYED_RELEASE_TABLET | Freq: Two times a day (BID) | ORAL | Status: DC
  Administered 2017-04-18 – 2017-04-19 (×2): 40 mg via ORAL
  Filled 2017-04-18 (×3): qty 1

## 2017-04-18 MED ORDER — LIDOCAINE HCL 2 % EX GEL
CUTANEOUS | Status: DC | PRN
  Administered 2017-04-18: 1

## 2017-04-18 MED ORDER — FENTANYL CITRATE (PF) 100 MCG/2ML IJ SOLN
INTRAMUSCULAR | Status: AC
  Filled 2017-04-18: qty 4

## 2017-04-18 MED ORDER — ONDANSETRON HCL 4 MG/2ML IJ SOLN
4.0000 mg | Freq: Once | INTRAMUSCULAR | Status: AC
  Administered 2017-04-18: 4 mg via INTRAVENOUS

## 2017-04-18 MED ORDER — LIDOCAINE HCL (PF) 1 % IJ SOLN
INTRAMUSCULAR | Status: DC | PRN
  Administered 2017-04-18: 6 mL

## 2017-04-18 NOTE — Progress Notes (Signed)
Name: Denise Barnett MRN: [id number] DOB: [birth year]    ADMISSION DATE:  04/15/2017 CONSULTATION DATE:  5/5  REFERRING MD : Triad  CHIEF COMPLAINT:  DOE  BRIEF PATIENT DESCRIPTION: WNWD female  SIGNIFICANT EVENTS  5/4 tx to [facility]  STUDIES:     HISTORY OF PRESENT ILLNESS:   46 yo former smoker(1ppd age [age]) with psoriasis and history of prednisone , methotrexate use and for last year on Humaria with 2 negative TB test. She presented 6 weeks ago to Urgent care with left sided chest pain, no FCS ore sputum production, spot seen on Cxr and she was treated for pneumonia with Levaquin. Pain spread to right side and when she was revaluated CT chest revealed bilateral opacities concerning for infectious process. Transferred for OHS to [facility] for Pulmonary evaluation. She denies toxic exposure, exotic travel or animal exposure/ She will most likely need FOB for identification of any organism. Case discussed with MD.  SUBJECTIVE:  No events overnight  VITAL SIGNS: Temp:  [98.4 F (36.9 C)-99.2 F (37.3 C)] 98.5 F (36.9 C) (05/07 1207) Pulse Rate:  [78-88] 82 (05/07 1220) Resp:  [10-18] 10 (05/07 1220) BP: (110-143)/(44-90) 124/65 (05/07 1220) SpO2:  [95 %-100 %] 100 % (05/07 1220) Weight:  [98.4 kg (217 lb)] 98.4 kg (217 lb) (05/07 1207)  PHYSICAL EXAMINATION: General:  WNWD female no acute distress Neuro:  Intact HEENT:No LAN/JVD Cardiovascular:  HSR RRR Lungs:  CTA bilaterally Abdomen:  Soft, NT, ND and +bs Musculoskeletal:  intact Skin:  Bilateral feet with plaque psoriasis   Recent Labs Lab 04/16/17 0202 04/16/17 1107  NA 140  --   K 3.6  --   CL 106  --   CO2 24  --   BUN 12  --   CREATININE 0.67 0.64  GLUCOSE 176*  --     Recent Labs Lab 04/16/17 0202 04/16/17 1107  HGB 12.0 11.7*  HCT 35.6* 35.8*  WBC 11.0* 10.1  PLT 292 301   No results found.  ASSESSMENT :  Dyspnea on exertion, worse over last 12 weeks   OSA (obstructive sleep apnea) uses cpap  daily   Interstitial pneumonia (HCC)   Psoriasis on humria x 1 year, 2 negative TB test   Former cigarette smoker age 28 - 32 1 ppd or less    I reviewed CT myself, infiltrate R>L noted.  Discussion: 46 yo former smoker(1ppd age [age]) with psoriasis and history of prednisone , methotrexate use and for last year on Humaria with 2 negative TB test. She presented 6 weeks ago to Urgent care with left sided chest pain, no FCS ore sputum production, spot seen on Cxr and she was treated for pneumonia with Levaquin. Pain spread to right side and when she was revaluated CT chest revealed bilateral opacities concerning for infectious process. Transferred for OHS to [facility] for Pulmonary evaluation. She denies toxic exposure, exotic travel or animal exposure/ She will most likely need FOB for identification of any organism. Case discussed with MD.  Nodular infiltrates, bilateral, more bibasilar and peripheral. Differentials: Infection most likely diagnosis, need to r/o fungal infection Could be bacterial, septic emboli, fungal. Less likely viral. No signs and symptoms to suggest PTB.  Inflammatory given her psoriasis. Can have another underlying CTD but less likely - Bronch today - BAL for fungal and bacterial infections  OSA - Told her to have her own cpap machine. - Let her use her own cpap at [facility]  Updated patient at length bedside,  informed that this procedure is an initial step if search for an infection.  Discussed with Dr. Murlean Iba, proceed with bronch today  Rush Farmer, M.D. [facility] Pulmonary/Critical Care Medicine. Pager: [phone]. After hours pager: [phone].

## 2017-04-18 NOTE — Progress Notes (Signed)
Video bronchoscopy performed.  Intervention bronchial washing.  No complications noted.  Will continue to monitor.  Kobyn Kray G. Kameah Rawl, M.D. [facility] Pulmonary/Critical Care Medicine. Pager: [phone]. After hours pager: [phone]. 

## 2017-04-18 NOTE — Procedures (Signed)
Bronchoscopy Procedure Note Denise Barnett [id number] [birth year]  Procedure: Bronchoscopy Indications: Obtain specimens for culture and/or other diagnostic studies  Procedure Details Consent: Risks of procedure as well as the alternatives and risks of each were explained to the (patient/caregiver).  Consent for procedure obtained. Time Out: Verified patient identification, verified procedure, site/side was marked, verified correct patient position, special equipment/implants available, medications/allergies/relevent history reviewed, required imaging and test results available.  Performed  In preparation for procedure, patient was given 100% FiO2 and bronchoscope lubricated. Sedation: Benzodiazepines and Fentayl at 2 and 100 of versed and fentanyl  Airway entered and the following bronchi were examined: RUL, RML, RLL, LUL, LLL and Bronchi.   Airway diffusely appeared very irritated and erythematous. BAL from RUL Anterior segment and sent for bacterial, fungal and AFB stain and culture Bronchoscope removed.    Evaluation Hemodynamic Status: BP stable throughout; O2 sats: stable throughout Patient's Current Condition: stable Specimens:  Sent serosanguinous fluid Complications: No apparent complications Patient did tolerate procedure well.   Jennet Maduro 04/18/2017

## 2017-04-18 NOTE — Progress Notes (Signed)
PROGRESS NOTE                                                                                                                                                                                                             Patient Demographics:    Denise Barnett, is a 46 y.o. female, DOB - 04-18-1971, BSW:[id number]  Admit date - 04/15/2017   Admitting Physician Ivor Costa, MD  Outpatient Primary MD for the patient is Koirala, Dibas, MD  LOS - 1  Chief Complaint  Patient presents with  . Cough       Brief Narrative Denise Barnett  is a 47 y.o. female, With history of obstructive sleep apnea, diabetes mellitus who came to ED for abnormal imaging.   Subjective:   Patient in bed, appears comfortable, denies any headache, no fever, no chest pain or pressure, no shortness of breath , no abdominal pain but has mild nausea on 04/18/2017. No focal weakness.   Assessment  & Plan :     1. Multiple bilateral nodular opacities noted on CT scan. Patient on Humira and relatively symptom-free. Pulmonary has been consulted, she has had no TB suggestive symptoms i.e. no night sweats, unintentional weight loss, appetite has been good. Echocardiogram stable. Blood cultures so far negative. Already has ordered autoimmune serology, fungal cultures which are pending. She is due for bronchoscopy on 04/18/2017.  2. History of psoriasis. Outpatient follow-up with dermatologist, likely will need to come off of Humira.   3. Morbid obesity and obstructive sleep apnea. Follow with PCP for weight loss, CPAP daily at bedtime.   4. Mild nausea 04/18/2017. PPI, Zofran when necessary, Dulcolax suppository for obstipation her last bowel movement was 3 days ago, abdominal exam benign.   5. DM type II. Sliding scale for now. Hold metformin.  CBG (last 3)   Recent Labs  04/17/17 1631 04/17/17 2131 04/18/17 0744  GLUCAP 146* 97 138*      Diet : Diet  NPO time specified    Family Communication  :  Grandmother bedside  Code Status :  Full  Disposition Plan  :  Likely home soon  Consults  :  Pulmonary  Procedures  :    CT chest - Multiple bilateral nodular opacities  Echocardiogram - Left ventricle: The cavity size was normal. Wall thickness was normal. Systolic function was  normal. The estimated ejection fraction was in the range of 60% to 65%. Wall motion was normal; there were no regional wall motion abnormalities. Left  ventricular diastolic function parameters were normal. - Aortic valve: Valve area (VTI): 2.09 cm^2. Valve area (Vmax): 2.19 cm^2. Valve area (Vmean): 1.95 cm^2. - Mitral valve: There was mild regurgitation. - Atrial septum: No defect or patent foramen ovale was identified.  Bronchoscopy -    DVT Prophylaxis  :  Lovenox    Lab Results  Component Value Date   PLT 301 04/16/2017    Inpatient Medications  Scheduled Meds: . alum & mag hydroxide-simeth  30 mL Oral BID  . bisacodyl  10 mg Rectal Daily  . enoxaparin (LOVENOX) injection  40 mg Subcutaneous Q24H  . insulin aspart  0-9 Units Subcutaneous TID WC  . Norgestimate-Ethinyl Estradiol Triphasic  1 tablet Oral Daily  . pantoprazole  40 mg Oral BID  . simvastatin  20 mg Oral q1800  . sodium chloride flush  3 mL Intravenous Q12H   Continuous Infusions: . sodium chloride     PRN Meds:.sodium chloride, albuterol, HYDROcodone-acetaminophen, ondansetron **OR** ondansetron (ZOFRAN) IV, sodium chloride flush  Antibiotics  :    Anti-infectives    None         Objective:   Vitals:   04/17/17 2134 04/18/17 0657 04/18/17 0853 04/18/17 0900  BP: (!) 111/46 (!) 121/44 116/66 (!) 118/54  Pulse: 81 83 87 84  Resp: 18 18 17 18   Temp: 99.2 F (37.3 C) 98.4 F (36.9 C) 98.6 F (37 C) 98.4 F (36.9 C)  TempSrc: Oral Oral Oral Oral  SpO2: 96% 97% 95% 96%  Weight: 98.4 kg (217 lb)     Height:        Wt Readings from Last 3 Encounters:  04/17/17  98.4 kg (217 lb)  03/11/17 99.3 kg (219 lb)  02/09/17 99.3 kg (219 lb)     Intake/Output Summary (Last 24 hours) at 04/18/17 1012 Last data filed at 04/18/17 0937  Gross per 24 hour  Intake              720 ml  Output                0 ml  Net              720 ml     Physical Exam  Awake Alert, Oriented X 3, No new F.N deficits, Normal affect [place].AT,PERRAL Supple Neck,No JVD, No cervical lymphadenopathy appriciated.  Symmetrical Chest wall movement, Good air movement bilaterally, CTAB RRR,No Gallops,Rubs or new Murmurs, No Parasternal Heave +ve B.Sounds, Abd Soft, No tenderness, No organomegaly appriciated, No rebound - guarding or rigidity. No Cyanosis, Clubbing or edema, No new Rash or bruise    Data Review:    CBC  Recent Labs Lab 04/16/17 0202 04/16/17 1107  WBC 11.0* 10.1  HGB 12.0 11.7*  HCT 35.6* 35.8*  PLT 292 301  MCV 79.8 79.2  MCH 26.9 25.9*  MCHC 33.7 32.7  RDW 13.3 13.3  LYMPHSABS 2.6  --   MONOABS 1.2*  --   EOSABS 0.8*  --   BASOSABS 0.1  --     Chemistries   Recent Labs Lab 04/16/17 0202 04/16/17 1107 04/16/17 1556 04/16/17 1821  NA 140  --   --   --   K 3.6  --   --   --   CL 106  --   --   --  CO2 24  --   --   --   GLUCOSE 176*  --   --   --   BUN 12  --   --   --   CREATININE 0.67 0.64  --   --   CALCIUM 9.2  --   --   --   AST  --   --  28 31  ALT  --   --  18 18  ALKPHOS  --   --  94 95  BILITOT  --   --  0.6 0.6   ------------------------------------------------------------------------------------------------------------------ No results for input(s): CHOL, HDL, LDLCALC, TRIG, CHOLHDL, LDLDIRECT in the last 72 hours.  Lab Results  Component Value Date   HGBA1C 6.0 (H) 04/16/2017   ------------------------------------------------------------------------------------------------------------------ No results for input(s): TSH, T4TOTAL, T3FREE, THYROIDAB in the last 72 hours.  Invalid input(s):  FREET3 ------------------------------------------------------------------------------------------------------------------ No results for input(s): VITAMINB12, FOLATE, FERRITIN, TIBC, IRON, RETICCTPCT in the last 72 hours.  Coagulation profile  Recent Labs Lab 04/16/17 1556 04/16/17 1821  INR 1.02 1.01    No results for input(s): DDIMER in the last 72 hours.  Cardiac Enzymes No results for input(s): CKMB, TROPONINI, MYOGLOBIN in the last 168 hours.  Invalid input(s): CK ------------------------------------------------------------------------------------------------------------------ No results found for: BNP  Micro Results Recent Results (from the past 240 hour(s))  Culture, blood (Routine X 2) w Reflex to ID Panel     Status: None (Preliminary result)   Collection Time: 04/16/17  1:57 AM  Result Value Ref Range Status   Specimen Description RIGHT ANTECUBITAL  Final   Special Requests   Final    BOTTLES DRAWN AEROBIC AND ANAEROBIC Blood Culture results may not be optimal due to an inadequate volume of blood received in culture bottles   Culture NO GROWTH 2 DAYS  Final   Report Status PENDING  Incomplete  Culture, blood (Routine X 2) w Reflex to ID Panel     Status: None (Preliminary result)   Collection Time: 04/16/17  2:06 AM  Result Value Ref Range Status   Specimen Description BLOOD RIGHT ARM  Final   Special Requests   Final    BOTTLES DRAWN AEROBIC AND ANAEROBIC Blood Culture results may not be optimal due to an inadequate volume of blood received in culture bottles   Culture NO GROWTH 2 DAYS  Final   Report Status PENDING  Incomplete  MRSA PCR Screening     Status: None   Collection Time: 04/16/17  1:39 PM  Result Value Ref Range Status   MRSA by PCR NEGATIVE NEGATIVE Final    Comment:        The GeneXpert MRSA Assay (FDA approved for NASAL specimens only), is one component of a comprehensive MRSA colonization surveillance program. It is not intended to diagnose  MRSA infection nor to guide or monitor treatment for MRSA infections.     Radiology Reports Dg Chest 2 View  Result Date: 04/11/2017 CLINICAL DATA:  Slight cough, abnormal chest x-ray 11 days ago. History of asthma, diabetes, former smoker. EXAM: CHEST  2 VIEW COMPARISON:  PA and lateral chest x-ray of March 31, 2017 and March 11, 2017. FINDINGS: There remains increased density in the right middle lobe anteriorly. Slight interval increasing conspicuity of mid lung density on the right as well. The left lung is clear. There is no pleural effusion or pneumothorax. The heart and pulmonary vascularity are normal. The trachea is midline. The bony thorax exhibits no acute abnormality. IMPRESSION: Persistent increased density in  the anterior aspect of the right middle lobe. Chest CT scanning is recommended to exclude occult malignancy. Electronically Signed   By: David  Martinique M.D.   On: 04/11/2017 14:43   Dg Chest 2 View  Result Date: 03/31/2017 CLINICAL DATA:  Cyst of lung. EXAM: CHEST  2 VIEW COMPARISON:  03/11/2017 FINDINGS: Low volume chest. There is patchy density at the right middle lobe, progressed. No Kerley lines, effusion, or pneumothorax. Normal heart size. No evidence of adenopathy. IMPRESSION: Progressed right middle lobe opacity. This could reflect untreated pneumonia or other acute airspace disease. Recommend close imaging follow-up in this patient on Humira. Electronically Signed   By: Monte Fantasia M.D.   On: 03/31/2017 14:34   Ct Chest W Contrast  Result Date: 04/15/2017 CLINICAL DATA:  Anterior right mid lung opacities on chest radiographs. EXAM: CT CHEST WITH CONTRAST TECHNIQUE: Multidetector CT imaging of the chest was performed during intravenous contrast administration. Creatinine was obtained on site at [facility] at [address] Results: Creatinine 0.5 mg/dL. CONTRAST:  12mL ISOVUE-300 IOPAMIDOL (ISOVUE-300) INJECTION 61% COMPARISON:  04/11/2017 chest radiograph.  FINDINGS: Cardiovascular: Normal heart size. No significant pericardial fluid/thickening. Atherosclerotic nonaneurysmal thoracic aorta. Normal caliber pulmonary arteries. No central pulmonary emboli. Mediastinum/Nodes: No discrete thyroid nodules. Unremarkable esophagus. No axillary adenopathy. Mildly enlarged 1.0 cm subcarinal node (series 3/ image 44). No additional pathologically enlarged mediastinal or hilar lymph nodes. Lungs/Pleura: No pneumothorax. No pleural effusion. There are several (greater than 5) poorly marginated masslike and nodular foci of consolidation scattered in the right greater than left lungs involving the right middle lobe and bilateral lower lobes, each demonstrating surrounding ground-glass attenuation. For example, a 3.8 x 2.3 cm masslike focus of consolidation in the anterior right middle lobe (series 4/ image 49), a 3.7 x 1.5 cm masslike focus of consolidation in the anteromedial right middle lobe (series 4/ image 59), a 2.4 x 1.9 cm medial right lower lobe nodular focus of consolidation (series 4/ image 55) and a 1.4 x 0.7 cm nodular medial left lower lobe focus of consolidation (series 4/image 45). Upper abdomen: Unremarkable. Musculoskeletal: No aggressive appearing focal osseous lesions. Scattered subcentimeter sclerotic foci in the thoracic vertebral bodies are too small to characterize and probably benign bone islands. Minimal thoracic spondylosis. IMPRESSION: 1. Several poorly marginated masslike and nodular foci of consolidation scattered in the right greater than left lungs with surrounding ground-glass attenuation. Given the recent clear radiographic progression of these opacities between 03/11/2017 and 03/31/2017, a multilobar infection is favored, with the differential including atypical infections such as fungal pneumonia given the masslike/nodular quality of the opacities and surrounding ground-glass attenuation. Post treatment follow-up chest CT advised in 3 months. 2.  Nonspecific mild mediastinal lymphadenopathy, probably reactive. 3. Aortic atherosclerosis. Electronically Signed   By: Ilona Sorrel M.D.   On: 04/15/2017 11:29    Time Spent in minutes  30   Lala Lund M.D on 04/18/2017 at 10:12 AM  Between 7am to 7pm - Pager - [phone] ( page via [email or web address], text pages only, please mention full 10 digit call back number). After 7pm go to [email or web address] - password [email or web address]

## 2017-04-19 DIAGNOSIS — R0609 Other forms of dyspnea: Secondary | ICD-10-CM

## 2017-04-19 LAB — GLUCOSE, CAPILLARY
Glucose-Capillary: 142 mg/dL — ABNORMAL HIGH (ref 65–99)
Glucose-Capillary: 138 mg/dL — ABNORMAL HIGH (ref 65–99)

## 2017-04-19 LAB — HISTOPLASMA ANTIGEN, URINE: Histoplasma Antigen, urine: <0.5 (ref <0.5)

## 2017-04-19 MED ORDER — MAGNESIUM HYDROXIDE 400 MG/5ML PO SUSP: 30.0000 mL | Freq: Every day | ORAL | Status: DC

## 2017-04-19 NOTE — Progress Notes (Signed)
Name: Denise Barnett MRN: [id number] DOB: 1971/08/11    ADMISSION DATE:  04/15/2017 CONSULTATION DATE:  5/5  REFERRING MD : Triad  CHIEF COMPLAINT:  DOE  BRIEF PATIENT DESCRIPTION: WNWD female  SIGNIFICANT EVENTS  5/4 tx to [facility]  STUDIES:     HISTORY OF PRESENT ILLNESS:   46 yo former smoker(1ppd age [age]) with psoriasis and history of prednisone , methotrexate use and for last year on Humaria with 2 negative TB test. She presented 6 weeks ago to Urgent care with left sided chest pain, no FCS ore sputum production, spot seen on Cxr and she was treated for pneumonia with Levaquin. Pain spread to right side and when she was revaluated CT chest revealed bilateral opacities concerning for infectious process. Transferred for OHS to [facility] for Pulmonary evaluation. She denies toxic exposure, exotic travel or animal exposure/ She will most likely need FOB for identification of any organism. Case discussed with MD.  SUBJECTIVE:  No events overnight, tolerated bronch well  VITAL SIGNS: Temp:  [98.2 F (36.8 C)-99.1 F (37.3 C)] 98.2 F (36.8 C) (05/08 1052) Pulse Rate:  [73-113] 79 (05/08 1052) Resp:  [6-17] 16 (05/08 1052) BP: (97-175)/(49-82) 129/67 (05/08 1052) SpO2:  [92 %-100 %] 97 % (05/08 1052) Weight:  [98.4 kg (217 lb)] 98.4 kg (217 lb) (05/07 1207)  PHYSICAL EXAMINATION: General:  WNWD female no acute distress Neuro:  Alert and interactive, moving all ext to command HEENT: [place]/AT, PERRL, EOM-I and MMM Cardiovascular:  RRR, Nl S1/S2, -M/R/G. Lungs:  CTA bilaterally Abdomen:  Soft, NT, ND and +bs Musculoskeletal:  intact Skin:  Bilateral feet with plaque psoriasis   Recent Labs Lab 04/16/17 0202 04/16/17 1107  NA 140  --   K 3.6  --   CL 106  --   CO2 24  --   BUN 12  --   CREATININE 0.67 0.64  GLUCOSE 176*  --     Recent Labs Lab 04/16/17 0202 04/16/17 1107  HGB 12.0 11.7*  HCT 35.6* 35.8*  WBC 11.0* 10.1  PLT 292 301   No results  found.  ASSESSMENT :  Dyspnea on exertion, worse over last 12 weeks   OSA (obstructive sleep apnea) uses cpap daily   Interstitial pneumonia (HCC)   Psoriasis on humria x 1 year, 2 negative TB test   Former cigarette smoker age 39 - 50 1 ppd or less    I reviewed CT myself, infiltrate R>L noted.  Discussion: 46 yo former smoker(1ppd age [age]) with psoriasis and history of prednisone , methotrexate use and for last year on Humaria with 2 negative TB test. She presented 6 weeks ago to Urgent care with left sided chest pain, no FCS ore sputum production, spot seen on Cxr and she was treated for pneumonia with Levaquin. Pain spread to right side and when she was revaluated CT chest revealed bilateral opacities concerning for infectious process. Transferred for OHS to [facility] for Pulmonary evaluation. She denies toxic exposure, exotic travel or animal exposure/ She will most likely need FOB for identification of any organism. Case discussed with MD.  Nodular infiltrates, bilateral, more bibasilar and peripheral. Differentials: Infection most likely diagnosis, need to r/o fungal infection Could be bacterial, septic emboli, fungal. Less likely viral. No signs and symptoms to suggest PTB.  Inflammatory given her psoriasis. Can have another underlying CTD but less likely - F/U on bronch results - OP appointment made for Wednesday at 3:30 PM with tammy parrett in [place] pulmonary  OSA - Patient may use her home CPAP - CPAP while asleep  Updated patient at length bedside and f/u appointment made.  Discussed with [facility]-MD, may discharge with f/u as above.  Rush Farmer, M.D. [facility] Pulmonary/Critical Care Medicine. Pager: [phone]. After hours pager: [phone].

## 2017-04-19 NOTE — Discharge Summary (Signed)
Denise Barnett VXB:[id number] DOB: [birth year] DOA: 04/15/2017  PCP: Lujean Amel, MD  Admit date: 04/15/2017  Discharge date: 04/19/2017  Admitted From: Home  Disposition:  Home   Recommendations for Outpatient Follow-up:   Follow up with PCP in 1-2 weeks  PCP Please obtain BMP/CBC, 2 view CXR in 1week,  (see Discharge instructions)   PCP Please follow up on the following pending results: Follow final bronchial culture results, fungal serology and autoimmune serology   Home Health: None  Equipment/Devices: None  Consultations: Pulmonary Discharge Condition: Stable   CODE STATUS: Full   Diet Recommendation:  Heart Healthy    Chief Complaint  Patient presents with  . Cough     Brief history of present illness from the day of admission and additional interim summary    [name] a 46 y.o.female,With history of obstructive sleep apnea, diabetes mellitus who came to ED for abnormal imaging.                                                                 Hospital Course   1. Multiple bilateral nodular opacities noted on CT scan. Patient on Humira and relatively symptom-free. Pulmonary was been consulted, she has had no TB suggestive symptoms i.e. no night sweats, unintentional weight loss, appetite has been good. Echocardiogram stable. Blood cultures so far negative. Pulmonary ordered autoimmune serology along with fungal serology which are pending, she status post bronchoscopy with sputum specimens sent for fungal cultures and routine cultures, all are pending. Discussed her case with pulmonary physician [name] this morning cleared for home discharge. She again is symptom-free. Request PCP to monitor final culture results along with final autoimmune serology results in a week. For now we recommend she  holds Humira until fungal and atypical infections are ruled out.   2. History of psoriasis. Outpatient follow-up with dermatologist, likely will need to come off of Humira.   3. Morbid obesity and obstructive sleep apnea. Follow with PCP for weight loss, CPAP daily at bedtime.   4. Mild nausea 04/18/2017. Due to obstipation resolved..   5. DM type II. Continue home regimen follow with PCP.     Discharge diagnosis     Active Problems:   OSA (obstructive sleep apnea)   Interstitial pneumonia (HCC)   Psoriasis on humria x 1 year, 2 negative TB test   Former cigarette smoker age 31 - 83 1 ppd or less   Dyspnea on exertion, worse over last 12 weeks   Lung nodules   Community acquired pneumonia    Discharge instructions    Discharge Instructions    Diet - low sodium heart healthy    Complete by:  As directed    Discharge instructions    Complete by:  As directed    Follow with Primary MD  Koirala, Dibas, MD in 7 days , follow final culture results  Get CBC, CMP, 2 view Chest X ray checked  by Primary MD or SNF MD in 5-7 days ( we routinely change or add medications that can affect your baseline labs and fluid status, therefore we recommend that you get the mentioned basic workup next visit with your PCP, your PCP may decide not to get them or add new tests based on their clinical decision)  Activity: As tolerated with Full fall precautions use walker/cane & assistance as needed  Disposition Home    Diet:   Heart Healthy    For Heart failure patients - Check your Weight same time everyday, if you gain over 2 pounds, or you develop in leg swelling, experience more shortness of breath or chest pain, call your Primary MD immediately. Follow Cardiac Low Salt Diet and 1.5 lit/day fluid restriction.  On your next visit with your primary care physician please Get Medicines reviewed and adjusted.  Please request your Prim.MD to go over all Hospital Tests and Procedure/Radiological  results at the follow up, please get all Hospital records sent to your Prim MD by signing hospital release before you go home.  If you experience worsening of your admission symptoms, develop shortness of breath, life threatening emergency, suicidal or homicidal thoughts you must seek medical attention immediately by calling 911 or calling your MD immediately  if symptoms less severe.  You Must read complete instructions/literature along with all the possible adverse reactions/side effects for all the Medicines you take and that have been prescribed to you. Take any new Medicines after you have completely understood and accpet all the possible adverse reactions/side effects.   Do not drive, operate heavy machinery, perform activities at heights, swimming or participation in water activities or provide baby sitting services if your were admitted for syncope or siezures until you have seen by Primary MD or a Neurologist and advised to do so again.  Do not drive when taking Pain medications.    Do not take more than prescribed Pain, Sleep and Anxiety Medications  Special Instructions: If you have smoked or chewed Tobacco  in the last 2 yrs please stop smoking, stop any regular Alcohol  and or any Recreational drug use.  Wear Seat belts while driving.   Please note  You were cared for by a hospitalist during your hospital stay. If you have any questions about your discharge medications or the care you received while you were in the hospital after you are discharged, you can call the unit and asked to speak with the hospitalist on call if the hospitalist that took care of you is not available. Once you are discharged, your primary care physician will handle any further medical issues. Please note that NO REFILLS for any discharge medications will be authorized once you are discharged, as it is imperative that you return to your primary care physician (or establish a relationship with a primary care  physician if you do not have one) for your aftercare needs so that they can reassess your need for medications and monitor your lab values.   Increase activity slowly    Complete by:  As directed       Discharge Medications   Allergies as of 04/19/2017      Reactions   Bactrim [sulfamethoxazole-trimethoprim] Rash   Penicillins Rash      Medication List    STOP taking these medications   HUMIRA PEN 40 MG/0.8ML Pnkt Generic drug:  Adalimumab   levofloxacin 500 MG tablet Commonly known as:  LEVAQUIN     TAKE these medications   HYDROcodone-acetaminophen 5-325 MG tablet Commonly known as:  NORCO/VICODIN Take 2 tablets by mouth every 4 (four) hours as needed.   ibuprofen 200 MG tablet Commonly known as:  ADVIL,MOTRIN Take 400 mg by mouth daily as needed. For pain   metFORMIN 500 MG tablet Commonly known as:  GLUCOPHAGE Take 500 mg by mouth 2 (two) times daily with a meal.   Norgestimate-Ethinyl Estradiol Triphasic 0.18/0.215/0.25 MG-35 MCG tablet Take 1 tablet by mouth daily.       Follow-up Information    Magdalen Spatz, NP Follow up on 04/27/2017.   Specialty:  Pulmonary Disease Why:  Your appointment is at 3:30PM Contact information: [address] [place] [address] [phone]        Koirala, Dibas, MD. Schedule an appointment as soon as possible for a visit in 1 week(s).   Specialty:  Family Medicine Contact information: [address] [place] [phone]           Major procedures and Radiology Reports - PLEASE review detailed and final reports thoroughly  -         Dg Chest 2 View  Result Date: 04/11/2017 CLINICAL DATA:  Slight cough, abnormal chest x-ray 11 days ago. History of asthma, diabetes, former smoker. EXAM: CHEST  2 VIEW COMPARISON:  PA and lateral chest x-ray of March 31, 2017 and March 11, 2017. FINDINGS: There remains increased density in the right middle lobe anteriorly. Slight  interval increasing conspicuity of mid lung density on the right as well. The left lung is clear. There is no pleural effusion or pneumothorax. The heart and pulmonary vascularity are normal. The trachea is midline. The bony thorax exhibits no acute abnormality. IMPRESSION: Persistent increased density in the anterior aspect of the right middle lobe. Chest CT scanning is recommended to exclude occult malignancy. Electronically Signed   By: David  Martinique M.D.   On: 04/11/2017 14:43   Dg Chest 2 View  Result Date: 03/31/2017 CLINICAL DATA:  Cyst of lung. EXAM: CHEST  2 VIEW COMPARISON:  03/11/2017 FINDINGS: Low volume chest. There is patchy density at the right middle lobe, progressed. No Kerley lines, effusion, or pneumothorax. Normal heart size. No evidence of adenopathy. IMPRESSION: Progressed right middle lobe opacity. This could reflect untreated pneumonia or other acute airspace disease. Recommend close imaging follow-up in this patient on Humira. Electronically Signed   By: Monte Fantasia M.D.   On: 03/31/2017 14:34   Ct Chest W Contrast  Result Date: 04/15/2017 CLINICAL DATA:  Anterior right mid lung opacities on chest radiographs. EXAM: CT CHEST WITH CONTRAST TECHNIQUE: Multidetector CT imaging of the chest was performed during intravenous contrast administration. Creatinine was obtained on site at [facility] at [address] Results: Creatinine 0.5 mg/dL. CONTRAST:  80m ISOVUE-300 IOPAMIDOL (ISOVUE-300) INJECTION 61% COMPARISON:  04/11/2017 chest radiograph. FINDINGS: Cardiovascular: Normal heart size. No significant pericardial fluid/thickening. Atherosclerotic nonaneurysmal thoracic aorta. Normal caliber pulmonary arteries. No central pulmonary emboli. Mediastinum/Nodes: No discrete thyroid nodules. Unremarkable esophagus. No axillary adenopathy. Mildly enlarged 1.0 cm subcarinal node (series 3/ image 44). No additional pathologically enlarged mediastinal or hilar lymph nodes.  Lungs/Pleura: No pneumothorax. No pleural effusion. There are several (greater than 5) poorly marginated masslike and nodular foci of consolidation scattered in the right greater than left lungs involving the right middle lobe and bilateral lower  lobes, each demonstrating surrounding ground-glass attenuation. For example, a 3.8 x 2.3 cm masslike focus of consolidation in the anterior right middle lobe (series 4/ image 49), a 3.7 x 1.5 cm masslike focus of consolidation in the anteromedial right middle lobe (series 4/ image 59), a 2.4 x 1.9 cm medial right lower lobe nodular focus of consolidation (series 4/ image 55) and a 1.4 x 0.7 cm nodular medial left lower lobe focus of consolidation (series 4/image 45). Upper abdomen: Unremarkable. Musculoskeletal: No aggressive appearing focal osseous lesions. Scattered subcentimeter sclerotic foci in the thoracic vertebral bodies are too small to characterize and probably benign bone islands. Minimal thoracic spondylosis. IMPRESSION: 1. Several poorly marginated masslike and nodular foci of consolidation scattered in the right greater than left lungs with surrounding ground-glass attenuation. Given the recent clear radiographic progression of these opacities between 03/11/2017 and 03/31/2017, a multilobar infection is favored, with the differential including atypical infections such as fungal pneumonia given the masslike/nodular quality of the opacities and surrounding ground-glass attenuation. Post treatment follow-up chest CT advised in 3 months. 2. Nonspecific mild mediastinal lymphadenopathy, probably reactive. 3. Aortic atherosclerosis. Electronically Signed   By: Ilona Sorrel M.D.   On: 04/15/2017 11:29    Micro Results     Recent Results (from the past 240 hour(s))  Culture, blood (Routine X 2) w Reflex to ID Panel     Status: None (Preliminary result)   Collection Time: 04/16/17  1:57 AM  Result Value Ref Range Status   Specimen Description RIGHT  ANTECUBITAL  Final   Special Requests   Final    BOTTLES DRAWN AEROBIC AND ANAEROBIC Blood Culture results may not be optimal due to an inadequate volume of blood received in culture bottles   Culture NO GROWTH 2 DAYS  Final   Report Status PENDING  Incomplete  Culture, blood (Routine X 2) w Reflex to ID Panel     Status: None (Preliminary result)   Collection Time: 04/16/17  2:06 AM  Result Value Ref Range Status   Specimen Description BLOOD RIGHT ARM  Final   Special Requests   Final    BOTTLES DRAWN AEROBIC AND ANAEROBIC Blood Culture results may not be optimal due to an inadequate volume of blood received in culture bottles   Culture NO GROWTH 2 DAYS  Final   Report Status PENDING  Incomplete  MRSA PCR Screening     Status: None   Collection Time: 04/16/17  1:39 PM  Result Value Ref Range Status   MRSA by PCR NEGATIVE NEGATIVE Final    Comment:        The GeneXpert MRSA Assay (FDA approved for NASAL specimens only), is one component of a comprehensive MRSA colonization surveillance program. It is not intended to diagnose MRSA infection nor to guide or monitor treatment for MRSA infections.   Culture, bal-quantitative     Status: None (Preliminary result)   Collection Time: 04/18/17  1:00 PM  Result Value Ref Range Status   Specimen Description BRONCHIAL ALVEOLAR LAVAGE  Final   Special Requests Immunocompromised  Final   Gram Stain   Final    FEW WBC PRESENT,BOTH PMN AND MONONUCLEAR RARE SQUAMOUS EPITHELIAL CELLS PRESENT RARE GRAM POSITIVE COCCI IN PAIRS    Culture CULTURE REINCUBATED FOR BETTER GROWTH  Final   Report Status PENDING  Incomplete    Today   Subjective    Lizanne Girardot today has no headache,no chest abdominal pain,no new weakness tingling or numbness, feels much better wants  to go home today.     Objective   Blood pressure 129/67, pulse 79, temperature 98.2 F (36.8 C), temperature source Oral, resp. rate 16, height _0  (1.575 m), weight 98.4  kg (217 lb), last menstrual period 04/15/2017, SpO2 97 %.   Intake/Output Summary (Last 24 hours) at 04/19/17 1123 Last data filed at 04/19/17 1054  Gross per 24 hour  Intake              720 ml  Output              100 ml  Net              620 ml    Exam Awake Alert, Oriented x 3, No new F.N deficits, Normal affect [place].AT,PERRAL Supple Neck,No JVD, No cervical lymphadenopathy appriciated.  Symmetrical Chest wall movement, Good air movement bilaterally, CTAB RRR,No Gallops,Rubs or new Murmurs, No Parasternal Heave +ve B.Sounds, Abd Soft, Non tender, No organomegaly appriciated, No rebound -guarding or rigidity. No Cyanosis, Clubbing or edema, No new Rash or bruise   Data Review   CBC w Diff: Lab Results  Component Value Date   WBC 10.1 04/16/2017   HGB 11.7 (L) 04/16/2017   HCT 35.8 (L) 04/16/2017   PLT 301 04/16/2017   LYMPHOPCT 24 04/16/2017   MONOPCT 11 04/16/2017   EOSPCT 7 04/16/2017   BASOPCT 1 04/16/2017    CMP: Lab Results  Component Value Date   NA 140 04/16/2017   K 3.6 04/16/2017   CL 106 04/16/2017   CO2 24 04/16/2017   BUN 12 04/16/2017   CREATININE 0.64 04/16/2017   PROT 7.2 04/16/2017   ALBUMIN 3.1 (L) 04/16/2017   BILITOT 0.6 04/16/2017   ALKPHOS 95 04/16/2017   AST 31 04/16/2017   ALT 18 04/16/2017  .   Total Time in preparing paper work, data evaluation and todays exam - 42 minutes  Lala Lund M.D on 04/19/2017 at 11:23 AM  [facility]   Office  [phone]

## 2017-04-19 NOTE — Discharge Instructions (Signed)
Follow with Primary MD Koirala, Dibas, MD in 7 days , follow final culture results  Get CBC, CMP, 2 view Chest X ray checked  by Primary MD or SNF MD in 5-7 days ( we routinely change or add medications that can affect your baseline labs and fluid status, therefore we recommend that you get the mentioned basic workup next visit with your PCP, your PCP may decide not to get them or add new tests based on their clinical decision)  Activity: As tolerated with Full fall precautions use walker/cane & assistance as needed  Disposition Home    Diet:   Heart Healthy    For Heart failure patients - Check your Weight same time everyday, if you gain over 2 pounds, or you develop in leg swelling, experience more shortness of breath or chest pain, call your Primary MD immediately. Follow Cardiac Low Salt Diet and 1.5 lit/day fluid restriction.  On your next visit with your primary care physician please Get Medicines reviewed and adjusted.  Please request your Prim.MD to go over all Hospital Tests and Procedure/Radiological results at the follow up, please get all Hospital records sent to your Prim MD by signing hospital release before you go home.  If you experience worsening of your admission symptoms, develop shortness of breath, life threatening emergency, suicidal or homicidal thoughts you must seek medical attention immediately by calling 911 or calling your MD immediately  if symptoms less severe.  You Must read complete instructions/literature along with all the possible adverse reactions/side effects for all the Medicines you take and that have been prescribed to you. Take any new Medicines after you have completely understood and accpet all the possible adverse reactions/side effects.   Do not drive, operate heavy machinery, perform activities at heights, swimming or participation in water activities or provide baby sitting services if your were admitted for syncope or siezures until you have seen by  Primary MD or a Neurologist and advised to do so again.  Do not drive when taking Pain medications.    Do not take more than prescribed Pain, Sleep and Anxiety Medications  Special Instructions: If you have smoked or chewed Tobacco  in the last 2 yrs please stop smoking, stop any regular Alcohol  and or any Recreational drug use.  Wear Seat belts while driving.   Please note  You were cared for by a hospitalist during your hospital stay. If you have any questions about your discharge medications or the care you received while you were in the hospital after you are discharged, you can call the unit and asked to speak with the hospitalist on call if the hospitalist that took care of you is not available. Once you are discharged, your primary care physician will handle any further medical issues. Please note that NO REFILLS for any discharge medications will be authorized once you are discharged, as it is imperative that you return to your primary care physician (or establish a relationship with a primary care physician if you do not have one) for your aftercare needs so that they can reassess your need for medications and monitor your lab values.

## 2017-04-19 NOTE — Progress Notes (Signed)
Patient being discharged to home with family. IV removed. All meds and discharge instructions reviewed. All belongings with patient. Patient left unit in stable condition.  Sheliah Plane RN

## 2017-04-20 LAB — PNEUMOCYSTIS JIROVECI SMEAR BY DFA: PNEUMOCYSTIS JIROVECI AG: NEGATIVE

## 2017-04-20 LAB — CULTURE, BAL-QUANTITATIVE W GRAM STAIN

## 2017-04-20 LAB — CULTURE, BAL-QUANTITATIVE: CULTURE: NORMAL — AB

## 2017-04-21 LAB — CULTURE, BLOOD (ROUTINE X 2)
Culture: NO GROWTH
Culture: NO GROWTH

## 2017-04-25 DIAGNOSIS — L439 Lichen planus, unspecified: Secondary | ICD-10-CM | POA: Insufficient documentation

## 2017-04-25 DIAGNOSIS — E119 Type 2 diabetes mellitus without complications: Secondary | ICD-10-CM | POA: Insufficient documentation

## 2017-04-27 ENCOUNTER — Ambulatory Visit (INDEPENDENT_AMBULATORY_CARE_PROVIDER_SITE_OTHER): Payer: Managed Care, Other (non HMO) | Admitting: Acute Care

## 2017-04-27 ENCOUNTER — Ambulatory Visit (INDEPENDENT_AMBULATORY_CARE_PROVIDER_SITE_OTHER)
Admission: RE | Admit: 2017-04-27 | Discharge: 2017-04-27 | Disposition: A | Discharge status: Home / Self Care | Payer: Managed Care, Other (non HMO) | Source: Ambulatory Visit | Attending: Acute Care | Admitting: Acute Care

## 2017-04-27 ENCOUNTER — Encounter: Payer: Self-pay | Admitting: Acute Care

## 2017-04-27 VITALS — BP 124/60 | HR 91 | Ht 64.0 in | Wt 218.2 lb

## 2017-04-27 DIAGNOSIS — J189 Pneumonia, unspecified organism: Secondary | ICD-10-CM | POA: Diagnosis not present

## 2017-04-27 DIAGNOSIS — J849 Interstitial pulmonary disease, unspecified: Secondary | ICD-10-CM

## 2017-04-27 DIAGNOSIS — G4733 Obstructive sleep apnea (adult) (pediatric): Secondary | ICD-10-CM

## 2017-04-27 DIAGNOSIS — J302 Other seasonal allergic rhinitis: Secondary | ICD-10-CM

## 2017-04-27 DIAGNOSIS — J309 Allergic rhinitis, unspecified: Secondary | ICD-10-CM | POA: Insufficient documentation

## 2017-04-27 IMAGING — DX DG CHEST 2V
2 series · 2 of 2 positions shown · non-contrast
Comparison: CT chest [DATE] and chest radiograph [DATE].

CLINICAL DATA: Interstitial pneumonia, asthma and bronchitis.

EXAM:
CHEST  2 VIEW

[chest pa]
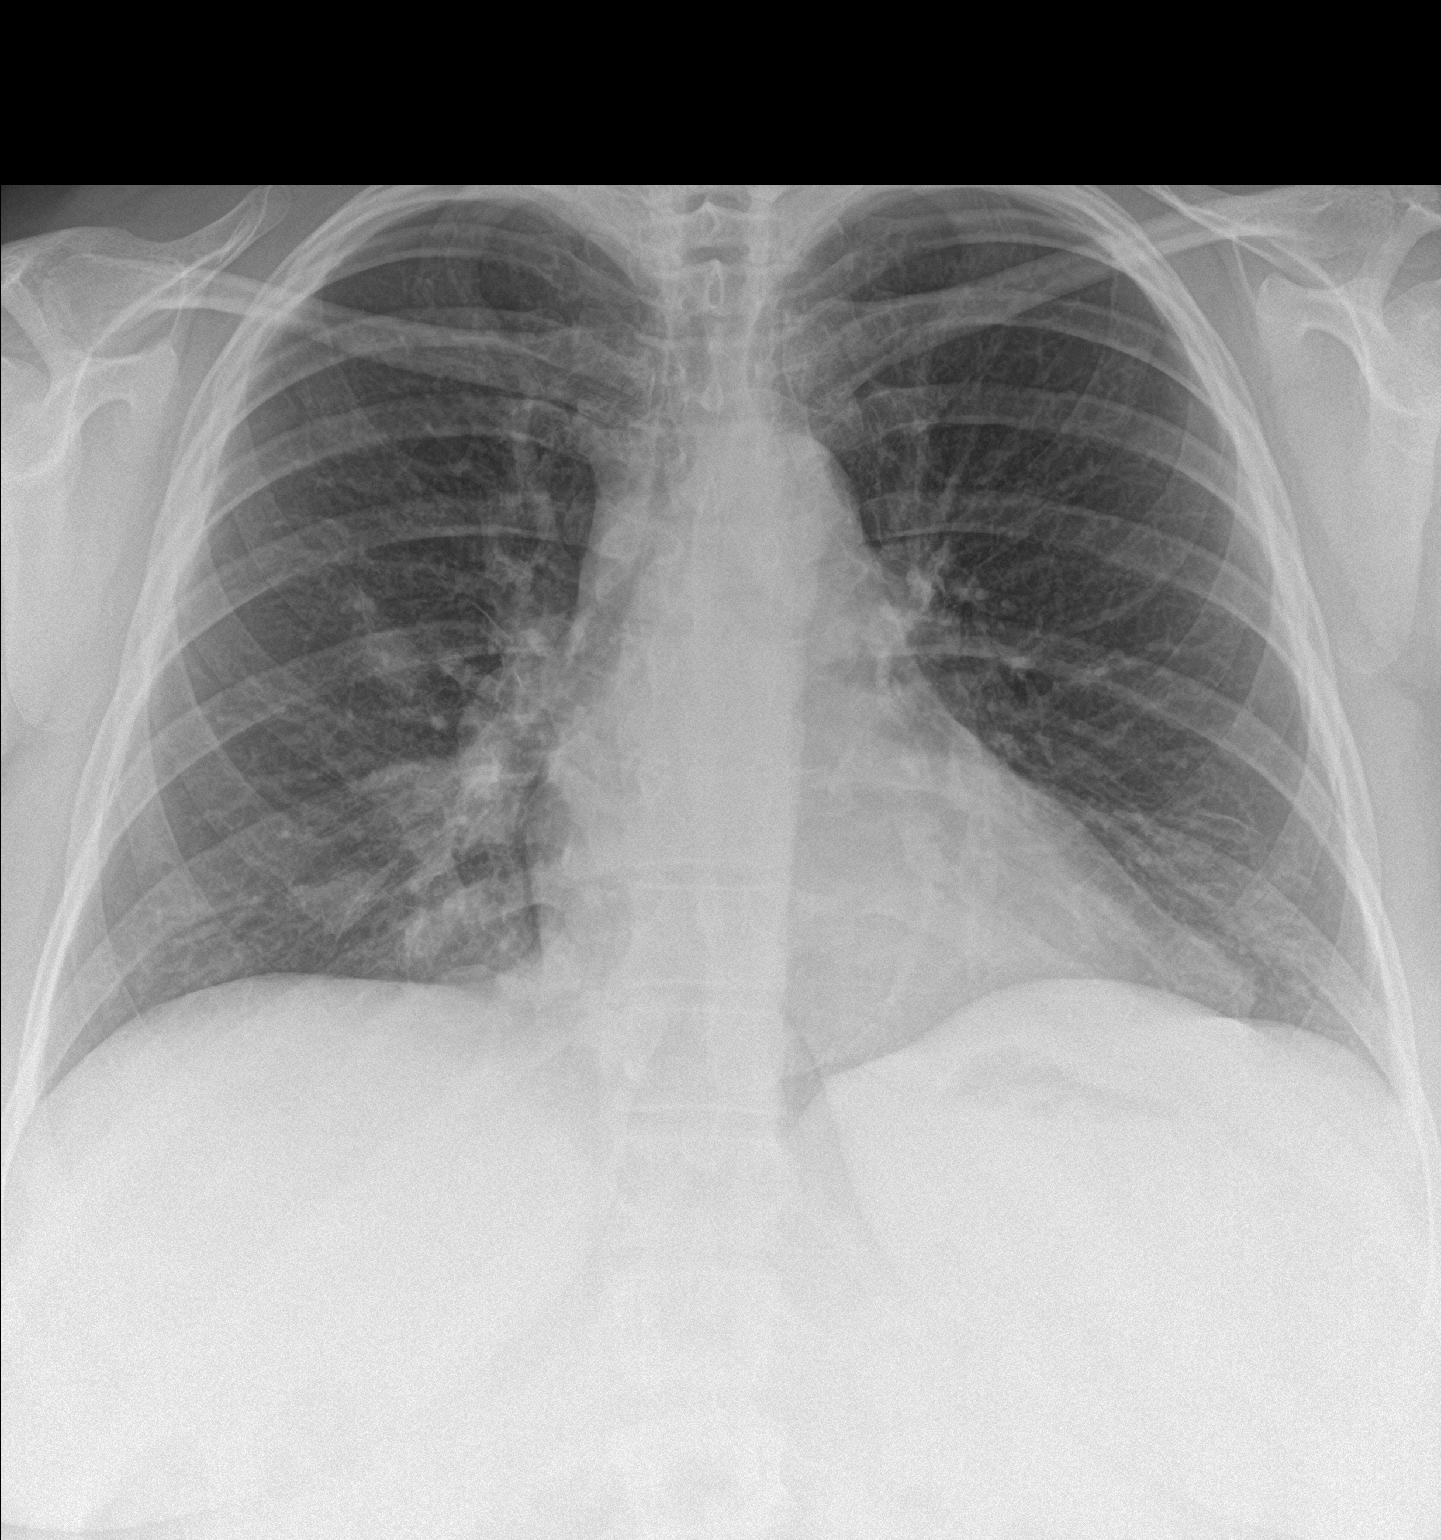

[chest lat]
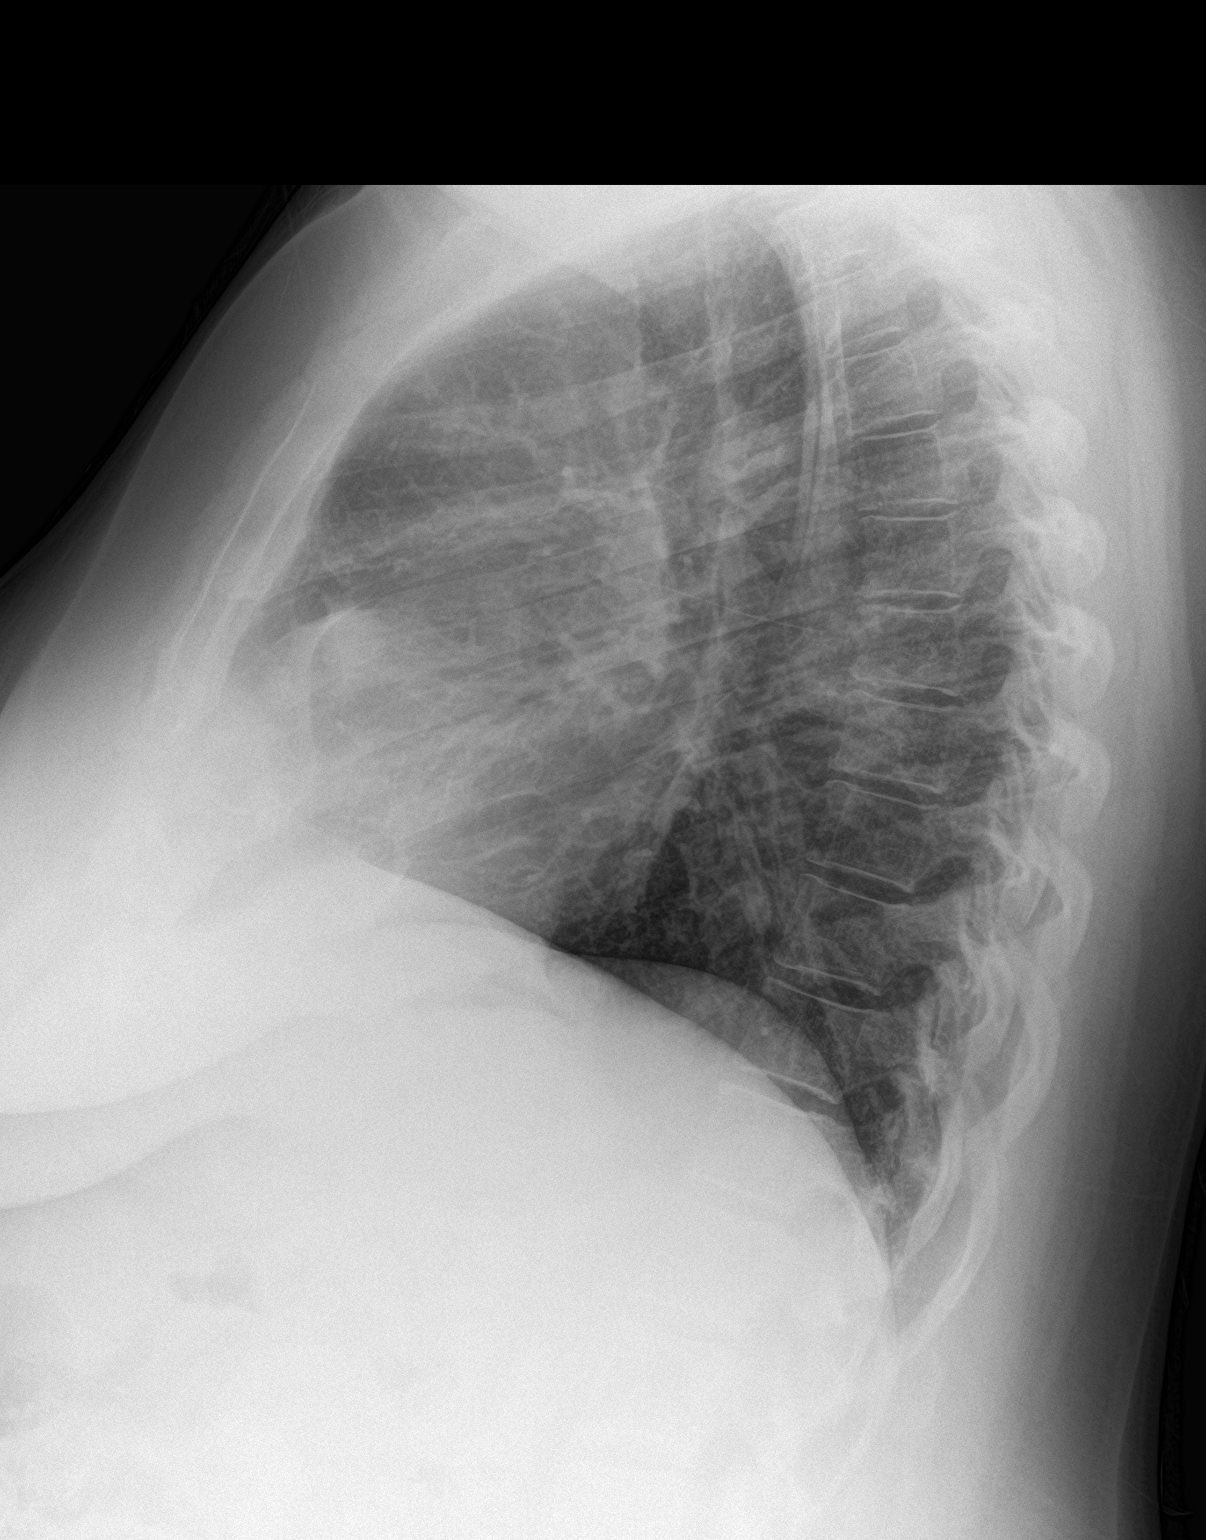

[2 of 2 positions shown; findings below may reference images not displayed]

FINDINGS: Trachea is midline. Heart size normal. Persistent areas of nodular
airspace opacification predominantly in the right lung. No pleural
fluid.
IMPRESSION: Persistent nodular airspace opacification is seen predominantly in
the right lung and likely due to pneumonia. Continued follow-up to
clearing is recommended.

## 2017-04-27 NOTE — Patient Instructions (Addendum)
It is good to meet you today. We will do a CXR today. Prednisone taper; 10 mg tablets: 4 tabs x 2 days, 3 tabs x 2 days, 2 tabs x 2 days 1 tab x 2 days then stop. levaquin 500 mg once daily x 75 days Remain off the Humira until we have follow up CT Chest without contrast in August ( around 07/17/2017). Continue Zyrtec daily Flonase nasal spray 2 squirts up each nare once daily. Saline nasal spray prior to flonase. Delsym 5 cc's every 12 hours for cough. Follow up CXR in 6 weeks to follow for clearing of opacification of right lung. Follow up appointment with Dr. Chase Caller in August after CT Please contact office for sooner follow up if symptoms do not improve or worsen or seek emergency care  Continue on CPAP at bedtime. You appear to be benefiting from the treatment Goal is to wear for at least 4-6 hours each night for maximal clinical benefit. Continue to work on weight loss, as the link between excess weight  and sleep apnea is well established.  Do not drive if sleepy.

## 2017-04-27 NOTE — Assessment & Plan Note (Signed)
Stuffy nose with PND Plan: Continue Zyrtec daily Flonase nasal spray 2 squirts up each nare once daily. Saline nasal spray prior to flonase. Delsym 5 cc's every 12 hours for cough. Please contact office for sooner follow up if symptoms do not improve or worsen or seek emergency care

## 2017-04-27 NOTE — Assessment & Plan Note (Addendum)
Resolving symptoms Cytology negative Humira on hold for now CXR with persistent opacities Plan: We will do a CXR today. Remain off the Humira until we have follow up CT Chest without contrast in August ( around 07/17/2017). Levaquin 500 mg once daily x 5 days. Prednisone taper; 10 mg tablets: 4 tabs x 2 days, 3 tabs x 2 days, 2 tabs x 2 days 1 tab x 2 days then stop. Follow up CXR in 2 weeks with follow up appointment with Dr. Melvyn Novas  to follow for clearing of opacification of right lung. Follow up appointment with Dr. Chase Caller in August after repeat  CT Please contact office for sooner follow up if symptoms do not improve or worsen or seek emergency care

## 2017-04-27 NOTE — Progress Notes (Signed)
History of Present Illness Denise Barnett is a 46 y.o. female with OSA, and diabetes mellitus type 2, recently on Humira for Psoriasis who was admitted to the hospital for CAP. She was seen by Dr.Yacoub  as inpatient.  HPI: 46 yo former smoker(1ppd age [age]) with psoriasis and history of prednisone , methotrexate use and for last year on Humaria with 2 negative TB test. She presented 6 weeks ago to Urgent care with left sided chest pain, no FCS ore sputum production, spot seen on Cxr and she was treated for pneumonia with Levaquin. Pain spread to right side and when she was revaluated CT chest revealed bilateral opacities concerning for infectious process. Transferred for OHS to [facility] for Pulmonary evaluation. She denies toxic exposure, exotic travel or animal exposure/ She had FOB on  04/18/2017  By [name] for identification of organism.   04/28/2017 Follow Up: Pt.presents for follow up of CAP.Denise Barnett She states she is doing well. She states her left-sided pain is at baseline or better. She really wants to know about  the results of her bronch which was done as an inpatient. She has a rare cough with occasional yellow secretions. She has post nasal gtt, and what she thinks are allergies. She has psoriasis which she has been taking Humira to treat. She has been advised to stop taking the Humira until we can determine the source of the potential pulmonary infectious process. She denies fever, chest pain, orthopnea, or hemoptysis. She has follow-up appointment with PCP 04/28/2017. PCP to monitor final culture results along with autoimmune serology. ( (See below) CXR today to follow infiltrates until clearinguntil clearing.    Test Results  CXR 04/27/2017: MPRESSION: Persistent nodular airspace opacification is seen predominantly in the right lung and likely due to pneumonia. Continued follow-up to clearing is recommended.   04/18/2017>>BAL results: > 100,000 colonies/mL. Consistent with normal  respiratory flora.>> Final 04/18/2017>> Pneumocystis>> Negative Auto Immune results 04/16/2017>> ANA Negative 04/16/2017>> C Reactive Protein 3.0 mg/dL 04/16/2017>> Sed Rate: 30 04/16/2017>> Histoplasma Antigen>> ,0.5 ng/dL  I cannot see any fungal smears in Micro  Pt has known psoriasis and Oral Lichan Planus ( Biopsy)  04/18/2017:Cytology BAL  BRONCHIAL WASHING (SPECIMEN 1 OF 1 COLLECTED 04/18/2017) NO MALIGNANT CELLS IDENTIFIED. BENIGN REACTIVE/REPARATIVE CHANGES. INFLAMMATORY CELLS PRESENT.  CT 04/14/2017 IMPRESSION: 1. Several poorly marginated masslike and nodular foci of consolidation scattered in the right greater than left lungs with surrounding ground-glass attenuation. Given the recent clear radiographic progression of these opacities between 03/11/2017 and 03/31/2017, a multilobar infection is favored, with the differential including atypical infections such as fungal pneumonia given the masslike/nodular quality of the opacities and surrounding ground-glass attenuation. Post treatment follow-up chest CT advised in 3 months. 2. Nonspecific mild mediastinal lymphadenopathy, probably reactive. 3. Aortic atherosclerosis.    CBC Latest Ref Rng & Units 04/16/2017 04/16/2017 01/04/2013  WBC 4.0 - 10.5 K/uL 10.1 11.0(H) 13.3(H)  Hemoglobin 12.0 - 15.0 g/dL 11.7(L) 12.0 12.3  Hematocrit 36.0 - 46.0 % 35.8(L) 35.6(L) 34.9(L)  Platelets 150 - 400 K/uL 301 292 315    BMP Latest Ref Rng & Units 04/16/2017 04/16/2017 01/04/2013  Glucose 65 - 99 mg/dL - 176(H) 161(H)  BUN 6 - 20 mg/dL - 12 11  Creatinine 0.44 - 1.00 mg/dL 0.64 0.67 0.62  Sodium 135 - 145 mmol/L - 140 130(L)  Potassium 3.5 - 5.1 mmol/L - 3.6 3.6  Chloride 101 - 111 mmol/L - 106 96  CO2 22 - 32 mmol/L - 24 22  Calcium 8.9 - 10.3 mg/dL - 9.2 9.3    Dg Chest 2 View  Result Date: 04/27/2017 CLINICAL DATA:  Interstitial pneumonia, asthma and bronchitis. EXAM: CHEST  2 VIEW COMPARISON:  CT chest 04/14/2017 and chest radiograph  04/11/2017. FINDINGS: Trachea is midline. Heart size normal. Persistent areas of nodular airspace opacification predominantly in the right lung. No pleural fluid. IMPRESSION: Persistent nodular airspace opacification is seen predominantly in the right lung and likely due to pneumonia. Continued follow-up to clearing is recommended. Electronically Signed   By: Lorin Picket M.D.   On: 04/27/2017 16:51   Dg Chest 2 View  Result Date: 04/11/2017 CLINICAL DATA:  Slight cough, abnormal chest x-ray 11 days ago. History of asthma, diabetes, former smoker. EXAM: CHEST  2 VIEW COMPARISON:  PA and lateral chest x-ray of March 31, 2017 and March 11, 2017. FINDINGS: There remains increased density in the right middle lobe anteriorly. Slight interval increasing conspicuity of mid lung density on the right as well. The left lung is clear. There is no pleural effusion or pneumothorax. The heart and pulmonary vascularity are normal. The trachea is midline. The bony thorax exhibits no acute abnormality. IMPRESSION: Persistent increased density in the anterior aspect of the right middle lobe. Chest CT scanning is recommended to exclude occult malignancy. Electronically Signed   By: David  Martinique M.D.   On: 04/11/2017 14:43   Dg Chest 2 View  Result Date: 03/31/2017 CLINICAL DATA:  Cyst of lung. EXAM: CHEST  2 VIEW COMPARISON:  03/11/2017 FINDINGS: Low volume chest. There is patchy density at the right middle lobe, progressed. No Kerley lines, effusion, or pneumothorax. Normal heart size. No evidence of adenopathy. IMPRESSION: Progressed right middle lobe opacity. This could reflect untreated pneumonia or other acute airspace disease. Recommend close imaging follow-up in this patient on Humira. Electronically Signed   By: Monte Fantasia M.D.   On: 03/31/2017 14:34   Ct Chest W Contrast  Result Date: 04/15/2017 CLINICAL DATA:  Anterior right mid lung opacities on chest radiographs. EXAM: CT CHEST WITH CONTRAST TECHNIQUE:  Multidetector CT imaging of the chest was performed during intravenous contrast administration. Creatinine was obtained on site at [place] at [address] Results: Creatinine 0.5 mg/dL. CONTRAST:  19m ISOVUE-300 IOPAMIDOL (ISOVUE-300) INJECTION 61% COMPARISON:  04/11/2017 chest radiograph. FINDINGS: Cardiovascular: Normal heart size. No significant pericardial fluid/thickening. Atherosclerotic nonaneurysmal thoracic aorta. Normal caliber pulmonary arteries. No central pulmonary emboli. Mediastinum/Nodes: No discrete thyroid nodules. Unremarkable esophagus. No axillary adenopathy. Mildly enlarged 1.0 cm subcarinal node (series 3/ image 44). No additional pathologically enlarged mediastinal or hilar lymph nodes. Lungs/Pleura: No pneumothorax. No pleural effusion. There are several (greater than 5) poorly marginated masslike and nodular foci of consolidation scattered in the right greater than left lungs involving the right middle lobe and bilateral lower lobes, each demonstrating surrounding ground-glass attenuation. For example, a 3.8 x 2.3 cm masslike focus of consolidation in the anterior right middle lobe (series 4/ image 49), a 3.7 x 1.5 cm masslike focus of consolidation in the anteromedial right middle lobe (series 4/ image 59), a 2.4 x 1.9 cm medial right lower lobe nodular focus of consolidation (series 4/ image 55) and a 1.4 x 0.7 cm nodular medial left lower lobe focus of consolidation (series 4/image 45). Upper abdomen: Unremarkable. Musculoskeletal: No aggressive appearing focal osseous lesions. Scattered subcentimeter sclerotic foci in the thoracic vertebral bodies are too small to characterize and probably benign bone islands. Minimal thoracic spondylosis. IMPRESSION: 1. Several poorly marginated  masslike and nodular foci of consolidation scattered in the right greater than left lungs with surrounding ground-glass attenuation. Given the recent clear radiographic progression of these  opacities between 03/11/2017 and 03/31/2017, a multilobar infection is favored, with the differential including atypical infections such as fungal pneumonia given the masslike/nodular quality of the opacities and surrounding ground-glass attenuation. Post treatment follow-up chest CT advised in 3 months. 2. Nonspecific mild mediastinal lymphadenopathy, probably reactive. 3. Aortic atherosclerosis. Electronically Signed   By: Ilona Sorrel M.D.   On: 04/15/2017 11:29     Past medical hx Past Medical History:  Diagnosis Date  . Asthma   . Bronchitis   . Diabetes mellitus without complication ([place])   . Lichen planus   . OSA (obstructive sleep apnea) 12/25/2013  . Pneumonia      Social History  Substance Use Topics  . Smoking status: Former Smoker    Packs/day: 0.75    Years: 20.00    Quit date: 2010  . Smokeless tobacco: Never Used  . Alcohol use No    Tobacco Cessation: Patient is a former smoker  Past surgical hx, Family hx, Social hx all reviewed.  Current Outpatient Prescriptions on File Prior to Visit  Medication Sig  . ibuprofen (ADVIL,MOTRIN) 200 MG tablet Take 400 mg by mouth daily as needed. For pain  . metFORMIN (GLUCOPHAGE) 500 MG tablet Take 500 mg by mouth 2 (two) times daily with a meal.   . Norgestimate-Ethinyl Estradiol Triphasic 0.18/0.215/0.25 MG-35 MCG tablet Take 1 tablet by mouth daily.   No current facility-administered medications on file prior to visit.      Allergies  Allergen Reactions  . Bactrim [Sulfamethoxazole-Trimethoprim] Rash  . Penicillins Rash    Review Of Systems:  Constitutional:   No  weight loss, night sweats,  Fevers, chills, fatigue, or  lassitude.  HEENT:   No headaches,  Difficulty swallowing,  Tooth/dental problems, or  Sore throat,                No sneezing, itching, ear ache, nasal congestion, post nasal drip,   CV:  No chest pain,  Orthopnea, PND, swelling in lower extremities, anasarca, dizziness, palpitations, syncope.     GI  No heartburn, indigestion, abdominal pain, nausea, vomiting, diarrhea, change in bowel habits, loss of appetite, bloody stools.   Resp: No shortness of breath with exertion or at rest.  No excess mucus, no productive cough,  No non-productive cough,  No coughing up of blood.  No change in color of mucus.  No wheezing.  No chest wall deformity  Skin: no rash or lesions.  GU: no dysuria, change in color of urine, no urgency or frequency.  No flank pain, no hematuria   MS:  No joint pain or swelling.  No decreased range of motion. Occasional L  back pain.  Psych:  No change in mood or affect. No depression or anxiety.  No memory loss.   Vital Signs BP 124/60 (BP Location: Left Arm, Cuff Size: Normal)   Pulse 91   Ht _0  (1.626 m)   Wt 218 lb 3.2 oz (99 kg)   LMP 04/23/2017   SpO2 98%   BMI 37.45 kg/m    Physical Exam:  General- No distress,  A&Ox3, obese ENT: No sinus tenderness, TM clear, pale nasal mucosa, no oral exudate,no post nasal drip, no LAN Cardiac: S1, S2, regular rate and rhythm, no murmur Chest: No wheeze/ rales/ dullness; no accessory muscle use, no nasal flaring, no  sternal retractions Abd.: Soft Non-tender, Obese Ext: No clubbing cyanosis, edema Neuro:  normal strength Skin: No rashes, warm and dry Psych: normal mood and behavior   Assessment/Plan  Community acquired pneumonia Resolving symptoms Cytology negative Humira on hold for now CXR with persistent opacities Plan: We will do a CXR today. Remain off the Humira until we have follow up CT Chest without contrast in August ( around 07/17/2017). Levaquin 500 mg once daily x 5 days. Prednisone taper; 10 mg tablets: 4 tabs x 2 days, 3 tabs x 2 days, 2 tabs x 2 days 1 tab x 2 days then stop. Follow up CXR in 2 weeks with follow up appointment with Dr. Melvyn Novas  to follow for clearing of opacification of right lung. Follow up appointment with Dr. Chase Caller in August after repeat  CT Please contact  office for sooner follow up if symptoms do not improve or worsen or seek emergency care    Rhinitis, allergic Stuffy nose with PND Plan: Continue Zyrtec daily Flonase nasal spray 2 squirts up each nare once daily. Saline nasal spray prior to flonase. Delsym 5 cc's every 12 hours for cough. Please contact office for sooner follow up if symptoms do not improve or worsen or seek emergency care       OSA (obstructive sleep apnea) OSA with CPAP Managed by [facility] Plan Continue on CPAP at bedtime. You appear to be benefiting from the treatment Goal is to wear for at least 4-6 hours each night for maximal clinical benefit. Continue to work on weight loss, as the link between excess weight  and sleep apnea is well established.  Do not drive if sleepy.       Magdalen Spatz, NP 04/28/2017  8:53 AM

## 2017-04-27 NOTE — Assessment & Plan Note (Signed)
OSA with CPAP Managed by [facility] Neurological Plan Continue on CPAP at bedtime. You appear to be benefiting from the treatment Goal is to wear for at least 4-6 hours each night for maximal clinical benefit. Continue to work on weight loss, as the link between excess weight  and sleep apnea is well established.  Do not drive if sleepy.

## 2017-04-28 ENCOUNTER — Telehealth: Payer: Self-pay | Admitting: Acute Care

## 2017-04-28 MED ORDER — PREDNISONE 10 MG PO TABS: ORAL_TABLET | ORAL | 0 refills | Status: DC

## 2017-04-28 MED ORDER — LEVOFLOXACIN 500 MG PO TABS: 500.0000 mg | ORAL_TABLET | Freq: Every day | ORAL | 0 refills | Status: DC

## 2017-04-28 NOTE — Telephone Encounter (Signed)
Spoke with pt and advised of Eric Form. NP recommendations.  PT verbalized understanding.  Rx's sent to pharmacy.  2 week f/u made with Dr Melvyn Novas.  Nothing further needed

## 2017-04-28 NOTE — Telephone Encounter (Signed)
Patient returned phone call..ert ° °

## 2017-04-28 NOTE — Telephone Encounter (Signed)
lmtcb for pt.  Will forward to Lindsay's box for follow up.

## 2017-04-28 NOTE — Telephone Encounter (Signed)
Please call patient and let her know her CXR indicated some residual pneumonia in her right lung. It is improving but I want to treat with Levaquin 500 mg once daily x 5 days. Additionally I would like her to start a prednisone taper. Prednisone taper; 10 mg tablets: 4 tabs x 2 days, 3 tabs x 2 days, 2 tabs x 2 days 1 tab x 2 days then stop.Please schedule her for follow up with Dr. Melvyn Novas in 2 weeks. ( He is familiar with her case). Thanks so much.

## 2017-04-28 NOTE — Progress Notes (Signed)
Chart and office note reviewed in detail along with available xrays/ labs > agree with a/p as outlined  

## 2017-05-16 ENCOUNTER — Ambulatory Visit (INDEPENDENT_AMBULATORY_CARE_PROVIDER_SITE_OTHER)
Admission: RE | Admit: 2017-05-16 | Discharge: 2017-05-16 | Disposition: A | Discharge status: Home / Self Care | Payer: Managed Care, Other (non HMO) | Source: Ambulatory Visit | Attending: Internal Medicine | Admitting: Internal Medicine

## 2017-05-16 ENCOUNTER — Ambulatory Visit (INDEPENDENT_AMBULATORY_CARE_PROVIDER_SITE_OTHER): Payer: Managed Care, Other (non HMO) | Admitting: Internal Medicine

## 2017-05-16 ENCOUNTER — Other Ambulatory Visit (INDEPENDENT_AMBULATORY_CARE_PROVIDER_SITE_OTHER): Payer: Managed Care, Other (non HMO)

## 2017-05-16 ENCOUNTER — Encounter: Payer: Self-pay | Admitting: Internal Medicine

## 2017-05-16 VITALS — BP 106/70 | HR 96 | Ht 63.0 in | Wt 217.0 lb

## 2017-05-16 DIAGNOSIS — R918 Other nonspecific abnormal finding of lung field: Secondary | ICD-10-CM | POA: Diagnosis not present

## 2017-05-16 LAB — CBC WITH DIFFERENTIAL/PLATELET
BASOS PCT: 0.9 % (ref 0.0–3.0)
Basophils Absolute: 0.1 10*3/uL (ref 0.0–0.1)
EOS ABS: 0.7 10*3/uL (ref 0.0–0.7)
Eosinophils Relative: 5.9 % — ABNORMAL HIGH (ref 0.0–5.0)
HEMATOCRIT: 38.2 % (ref 36.0–46.0)
Hemoglobin: 12.6 g/dL (ref 12.0–15.0)
LYMPHS ABS: 2.6 10*3/uL (ref 0.7–4.0)
Lymphocytes Relative: 20.8 % (ref 12.0–46.0)
MCHC: 32.9 g/dL (ref 30.0–36.0)
MCV: 78.6 fl (ref 78.0–100.0)
MONO ABS: 1.2 10*3/uL — AB (ref 0.1–1.0)
Monocytes Relative: 9.8 % (ref 3.0–12.0)
NEUTROS ABS: 7.7 10*3/uL (ref 1.4–7.7)
Neutrophils Relative %: 62.6 % (ref 43.0–77.0)
PLATELETS: 350 10*3/uL (ref 150.0–400.0)
RBC: 4.87 Mil/uL (ref 3.87–5.11)
RDW: 14.2 % (ref 11.5–15.5)
WBC: 12.4 10*3/uL — ABNORMAL HIGH (ref 4.0–10.5)

## 2017-05-16 LAB — SEDIMENTATION RATE: Sed Rate: 18 mm/hr (ref 0–20)

## 2017-05-16 IMAGING — DX DG CHEST 2V
2 series · 2 of 2 positions shown · non-contrast
Comparison: Chest CT [DATE] and chest radiograph [DATE]

CLINICAL DATA: Recent pneumonia.  Known lung nodules.

EXAM:
CHEST  2 VIEW

[chest pa]
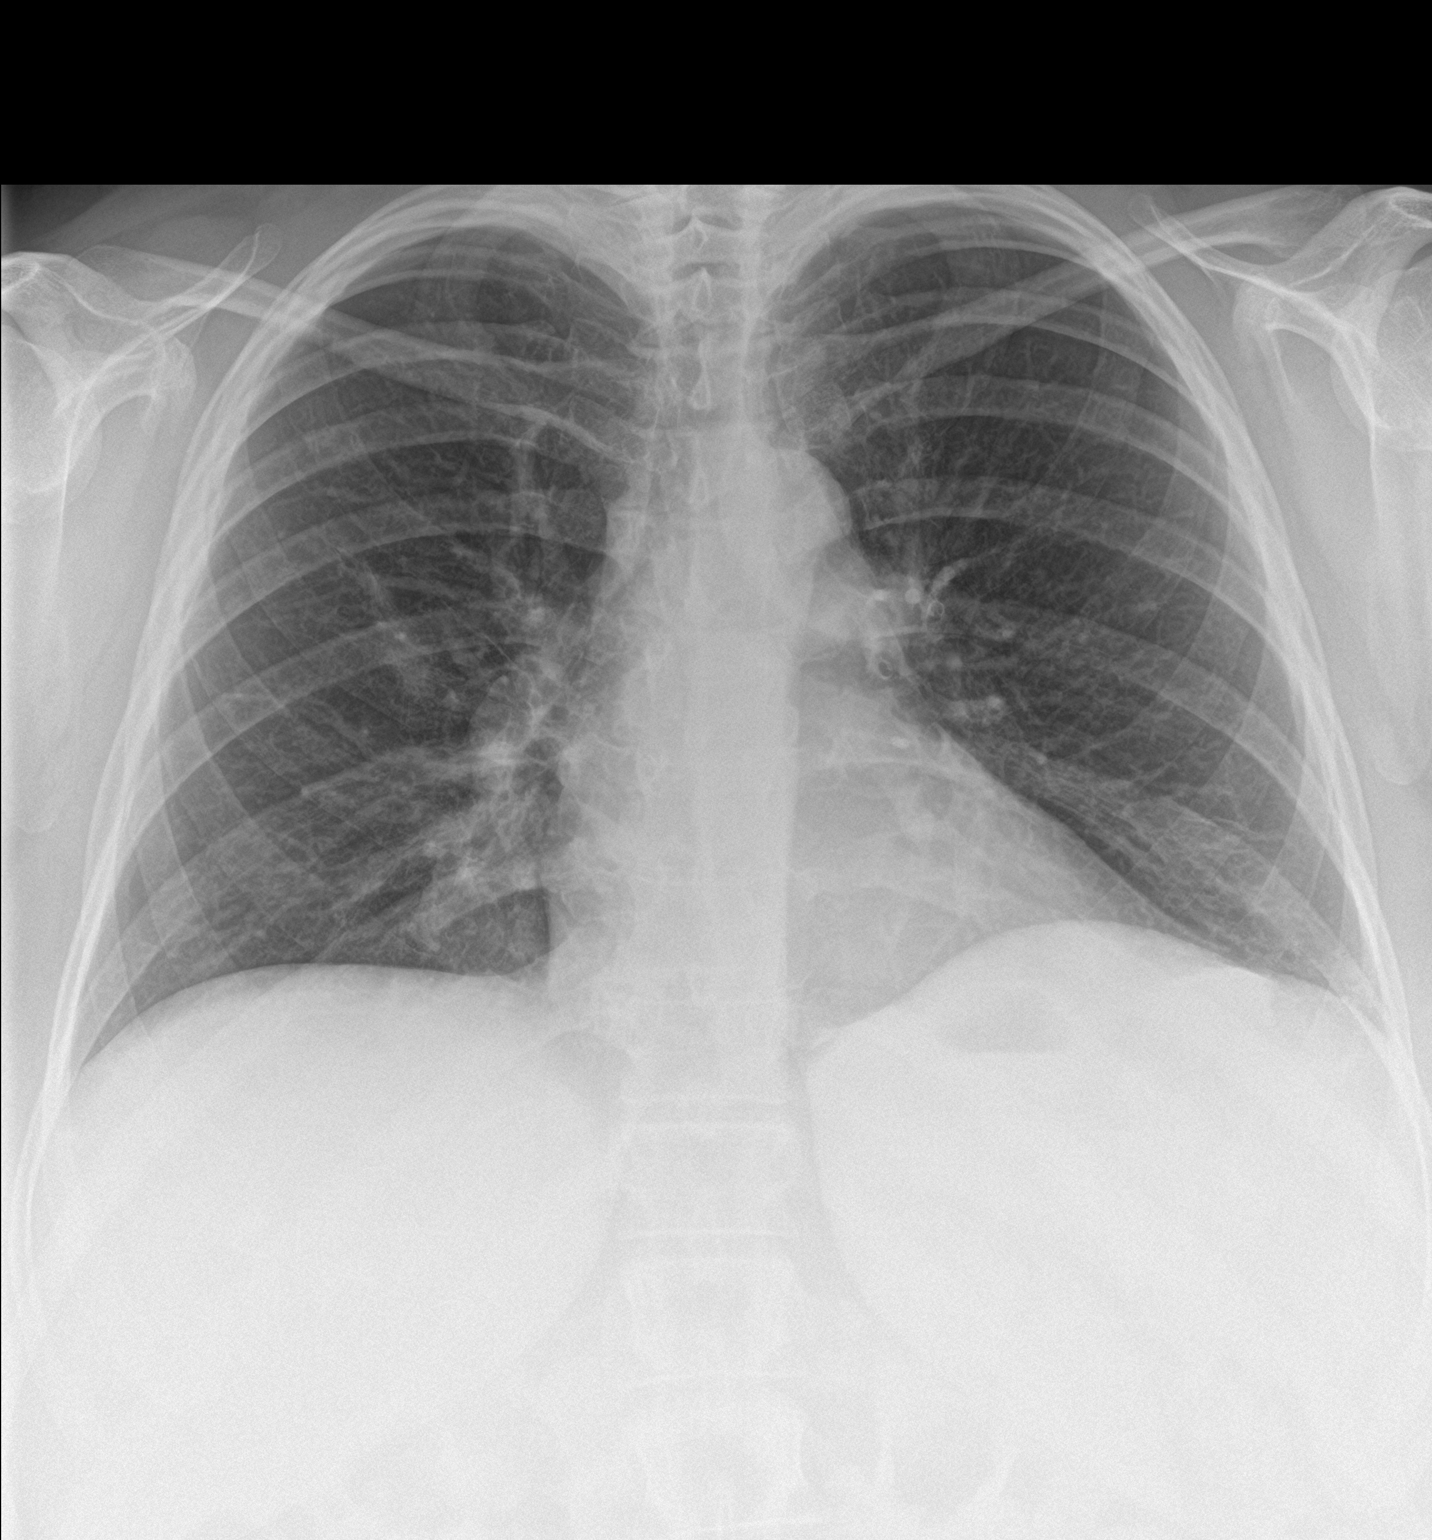

[chest lat]
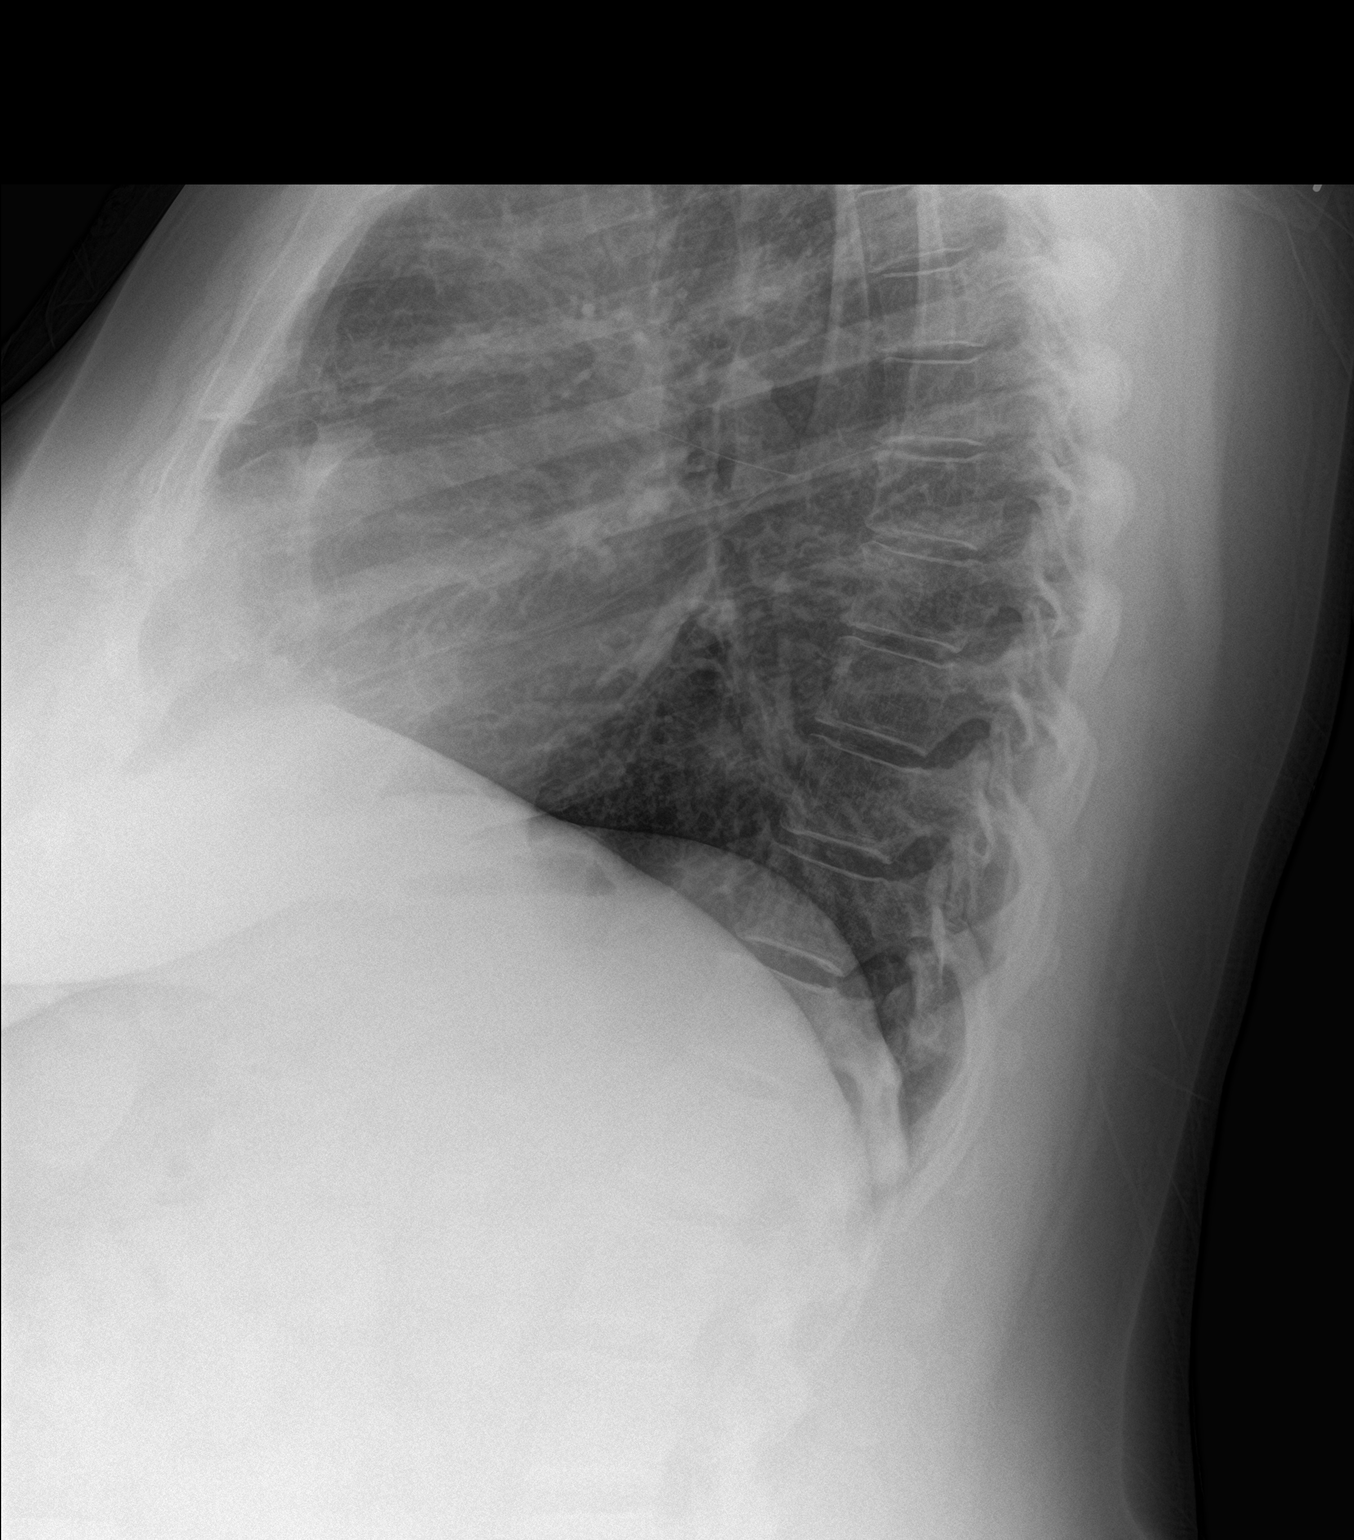

[2 of 2 positions shown; findings below may reference images not displayed]

FINDINGS: Nodular opacity in the right lateral perihilar region is again noted
measuring 1.5 x 0.8 cm. Ill-defined opacity in the right is
consistent with a masslike lesion seen in this area on recent CT. No
new opacities are evident compared to recent studies. Heart size and
pulmonary vascularity are normal. No adenopathy. No bone lesions.
IMPRESSION: Persistent opacities noted on the right. No new opacity on either
side by radiography. Stable cardiac silhouette. Close clinical and
imaging surveillance advised.

## 2017-05-16 NOTE — Progress Notes (Signed)
Subjective:     Patient ID: Denise Barnett, female   DOB: 03-31-1971,     MRN: [id number]  HPI  17 yowf stopped smoking 2010 at wt 180  with dx of "bronchitis" / required and generally better and no inhalers needed since quit maint on bcps and on humira per [name]  For psoriasis abrupt onset very localized R posterolateral cp around [place] better lying down  s assoc fever/ cough > 03/11/17 to ER with 45 min with RML density anteriorly no effusion or postlateral densities  rx levaquin x 10 days but also sob neg ddimer reported but not in EPIC > 50% better when completed abx and gradually since then 100% improved off all opioids x mid April and off advil since May 2018 but due to persistent changed on cxr/ ct admitted:     Admit date: 04/15/2017  Discharge date: 04/19/2017    Recommendations for Outpatient Follow-up:   Follow up with PCP in 1-2 weeks  PCP Please obtain BMP/CBC, 2 view CXR in 1week,  (see Discharge instructions)   PCP Please follow up on the following pending results: Follow final bronchial culture results, fungal serology and autoimmune serology> ana neg/histo antigen neg / cypto ag neg / hiv neg         Chief Complaint  Patient presents with  . Cough     Brief history of present illness from the day of admission and additional interim summary    [name] a 46 y.o.female,With history of obstructive sleep apnea, diabetes mellitus who came to ED for abnormal imaging.                                                                 Hospital Course   1. Multiple bilateral nodular opacities noted on CT scan. Patient on Humira and relatively symptom-free. Pulmonary was been consulted, she has had no TB suggestive symptoms i.e. no night sweats, unintentional weight loss, appetite has been good. Echocardiogram stable. Blood cultures so far negative. Pulmonary ordered autoimmune serology along with fungal serology which are pending, she status post  bronchoscopy with sputum specimens sent for fungal cultures and routine cultures, all are pending. Discussed her case with pulmonary physician [name] this morning cleared for home discharge. She again is symptom-free. Request PCP to monitor final culture results along with final autoimmune serology results in a week. For now we recommend she holds Humira until fungal and atypical infections are ruled out.   2. History of psoriasis. Outpatient follow-up with dermatologist, likely will need to come off of Humira.   3. Morbid obesity and obstructive sleep apnea. Follow with PCP for weight loss, CPAP daily at bedtime.   4. Mild nausea 04/18/2017. Due to obstipation resolved..   5. DM type II. Continue home regimen follow with PCP.     Discharge diagnosis     Active Problems:   OSA (obstructive sleep apnea)   Interstitial pneumonia (H[facility])   Psoriasis on humria x 1 year, 2 negative TB test   Former cigarette smoker age 44 - 77 1 ppd or less   Dyspnea on exertion, worse over last 12 weeks   Lung nodules   Community acquired pneumonia  04/18/17 BAL from [facility] Anterior segment and sent for bacterial, fungal and  AFB stain and culture> neg  04/27/17 NP eval  Pos for residual changes esp RML on f/u cxr reviewed with Izell Labat  rec Levaquin x 5 days Prednisone 10 mg take  4 each am x 2 days,   2 each am x 2 days,  1 each am x 2 days and stop    05/16/2017 1st [facility] office visit/ Oriel Rumbold   Chief Complaint  Patient presents with  . Follow-up    Pt states that she still has some occ right side pain. Her breathing has improved some. No new co's today.     Not limited by breathing from desired activities    No obvious day to day or daytime variability or assoc excess/ purulent sputum or mucus plugs or hemoptysis or ongoing cp or chest tightness, subjective wheeze or overt sinus or hb symptoms. No unusual exp hx or h/o childhood pna/ asthma or knowledge of premature  birth.  Sleeping ok without nocturnal  or early am exacerbation  of respiratory  c/o's or need for noct saba. Also denies any obvious fluctuation of symptoms with weather or environmental changes or other aggravating or alleviating factors except as outlined above   Current Medications, Allergies, Complete Past Medical History, Past Surgical History, Family History, and Social History were reviewed in [employer] record.  ROS  The following are not active complaints unless bolded sore throat, dysphagia, dental problems, itching, sneezing,  nasal congestion or excess/ purulent secretions, ear ache,   fever, chills, sweats, unintended wt loss, classically pleuritic or exertional cp,  orthopnea pnd or leg swelling, presyncope, palpitations, abdominal pain, anorexia, nausea, vomiting, diarrhea  or change in bowel or bladder habits, change in stools or urine, dysuria,hematuria,  rash, arthralgias, visual complaints, headache, numbness, weakness or ataxia or problems with walking or coordination,  change in mood/affect or memory.          Review of Systems     Objective:   Physical Exam   amb wf  nad   Wt Readings from Last 3 Encounters:  05/16/17 217 lb (98.4 kg)  04/27/17 218 lb 3.2 oz (99 kg)  04/18/17 217 lb (98.4 kg)    Vital signs reviewed  - Note on arrival 02 sats  98% on RA      HEENT: nl dentition, turbinates bilaterally, and oropharynx. Nl external ear canals without cough reflex   NECK :  without JVD/Nodes/TM/ nl carotid upstrokes bilaterally   LUNGS: no acc muscle use,  Nl contour chest which is clear to A and P bilaterally without cough on insp or exp maneuvers   CV:  RRR  no s3 or murmur or increase in P2, and no edema   ABD:  soft and nontender with nl inspiratory excursion in the supine position. No bruits or organomegaly appreciated, bowel sounds nl  MS:  Nl gait/ ext warm without deformities, calf tenderness, cyanosis or clubbing No  obvious joint restrictions   SKIN: warm and dry without lesions    NEURO:  alert, approp, nl sensorium with  no motor or cerebellar deficits apparent.     CXR PA and Lateral:   05/16/2017 :    I personally reviewed images /my impression as follows:   Reduced densities esp evident on R on PA and Posteriorly on Lateral       Lab Results  Component Value Date   ESRSEDRATE 18 05/16/2017   ESRSEDRATE 30 (H) 04/16/2017       Assessment:

## 2017-05-16 NOTE — Patient Instructions (Addendum)
Please remember to go to the lab and x-ray department downstairs in the basement  for your tests - we will call you with the results when they are available.   Please schedule a follow up office visit in 4 weeks, sooner if needed  Add:  Needs venous dopplers to be complete

## 2017-05-17 ENCOUNTER — Telehealth: Payer: Self-pay | Admitting: *Deleted

## 2017-05-17 DIAGNOSIS — R0609 Other forms of dyspnea: Principal | ICD-10-CM

## 2017-05-17 NOTE — Telephone Encounter (Signed)
LMTCB- also need to let her know her labs are normal

## 2017-05-17 NOTE — Progress Notes (Signed)
LMTCB

## 2017-05-17 NOTE — Assessment & Plan Note (Signed)
Onset of R Pleuritic Pain abrupt 03/11/17 with neg d dimer rx as pna/ pt on humira > d/c -04/18/17 BAL from [facility] Anterior segment and sent for bacterial, fungal and AFB stain and culture> neg rx levaquin x 15 days and pred x 6 days  - 05/16/2017 Kadden Osterhout f/u with improving cxr and ESR down to 18 from 30 in pt 100% clinically improved  I'm concerned the pt did not have a CTa at the onset of the pain since there was not assoc cough/ fever per her hx and she is obese and on bcp's and the best we can now now is venous dopplers to be sure there is no lurking source for recurrent PE's and keep her off of humira for at least the time being until we sort this out.  Her time course is a bit long for unresolved pna but she could have nodular boop/ organized pna on cxr and will f/u carefully to be sure she does not flare off steroids.  I had an extended discussion with the patient reviewing all relevant studies (including extensive inpt records)  completed to date and  lasting 25 minutes of a 40  minute post hosp f/u office visit with pt not previously known to me     re  severe non-specific but potentially very serious clinical and radiographic changes of uncertain and potentially multiple  etiologies.   Each maintenance medication was reviewed in detail including most importantly the difference between maintenance and prns and under what circumstances the prns are to be triggered using an action plan format that is not reflected in the computer generated alphabetically organized AVS.    Please see AVS for specific instructions unique to this office visit that I personally wrote and verbalized to the the pt in detail and then reviewed with pt  by my nurse highlighting any changes in therapy/plan of care  recommended at today's visit.

## 2017-05-17 NOTE — Assessment & Plan Note (Signed)
Body mass index is 38.44 kg/m.   No results found for: TSH   Contributing to gerd risk/ doe/reviewed the need and the process to achieve and maintain neg calorie balance > defer f/u primary care including intermittently monitoring thyroid status

## 2017-05-17 NOTE — Telephone Encounter (Signed)
-----   Message from Tanda Rockers, MD sent at 05/17/2017  6:25 AM EDT ----- After chart review, needs venous dopplers bilaterally this week to be sure there is no lurking dvt since she is on bcps

## 2017-05-17 NOTE — Telephone Encounter (Signed)
Pt returning call from  Magda Paganini can be reached @ [phone] .Hillery Hunter

## 2017-05-17 NOTE — Telephone Encounter (Signed)
Results have been explained to patient, pt expressed understanding. Venous Doppler placed as STAT. Nothing further needed.

## 2017-05-18 ENCOUNTER — Telehealth: Payer: Self-pay | Admitting: Internal Medicine

## 2017-05-18 NOTE — Telephone Encounter (Signed)
Spoke with patient. She wanted to check on status of doppler that was ordered yesterday. Advised patient that the order had been placed and they would contact her shortly to get that scheduled. Patient verbalized understanding. Nothing needed at time of call.

## 2017-05-20 ENCOUNTER — Other Ambulatory Visit: Payer: Self-pay | Admitting: Internal Medicine

## 2017-05-20 DIAGNOSIS — R0609 Other forms of dyspnea: Principal | ICD-10-CM

## 2017-05-20 NOTE — Progress Notes (Signed)
Spoke with pt and notified of results per Dr. Wert. Pt verbalized understanding and denied any questions. 

## 2017-05-23 ENCOUNTER — Encounter (INDEPENDENT_AMBULATORY_CARE_PROVIDER_SITE_OTHER): Payer: Self-pay

## 2017-05-23 ENCOUNTER — Ambulatory Visit (HOSPITAL_COMMUNITY)
Admission: RE | Admit: 2017-05-23 | Discharge: 2017-05-23 | Disposition: A | Discharge status: Home / Self Care | Payer: Managed Care, Other (non HMO) | Source: Ambulatory Visit | Attending: Internal Medicine | Admitting: Internal Medicine

## 2017-05-23 DIAGNOSIS — R0609 Other forms of dyspnea: Secondary | ICD-10-CM | POA: Diagnosis not present

## 2017-05-23 NOTE — Progress Notes (Signed)
*  Preliminary Results* Bilateral lower extremity venous duplex completed. Bilateral lower extremities are negative for deep vein thrombosis. There is no evidence of Baker's cyst bilaterally.  05/23/2017 11:59 AM Maudry Mayhew, BS, RVT, RDCS, RDMS

## 2017-05-25 NOTE — Progress Notes (Signed)
Spoke with patient and informed her of results and recommendations. She verbalized understanding and did not have any questions. Nothing further is needed.

## 2017-06-13 ENCOUNTER — Ambulatory Visit (INDEPENDENT_AMBULATORY_CARE_PROVIDER_SITE_OTHER)
Admission: RE | Admit: 2017-06-13 | Discharge: 2017-06-13 | Disposition: A | Discharge status: Home / Self Care | Payer: Managed Care, Other (non HMO) | Source: Ambulatory Visit | Attending: Internal Medicine | Admitting: Internal Medicine

## 2017-06-13 ENCOUNTER — Encounter: Payer: Self-pay | Admitting: Internal Medicine

## 2017-06-13 ENCOUNTER — Ambulatory Visit (INDEPENDENT_AMBULATORY_CARE_PROVIDER_SITE_OTHER): Payer: Managed Care, Other (non HMO) | Admitting: Internal Medicine

## 2017-06-13 VITALS — BP 114/70 | HR 90 | Ht 63.0 in | Wt 219.4 lb

## 2017-06-13 DIAGNOSIS — R918 Other nonspecific abnormal finding of lung field: Secondary | ICD-10-CM

## 2017-06-13 IMAGING — DX DG CHEST 2V
2 series · 2 of 2 positions shown · non-contrast
Comparison: [DATE]

CLINICAL DATA: Follow-up lung nodules

EXAM:
CHEST  2 VIEW

[chest pa]
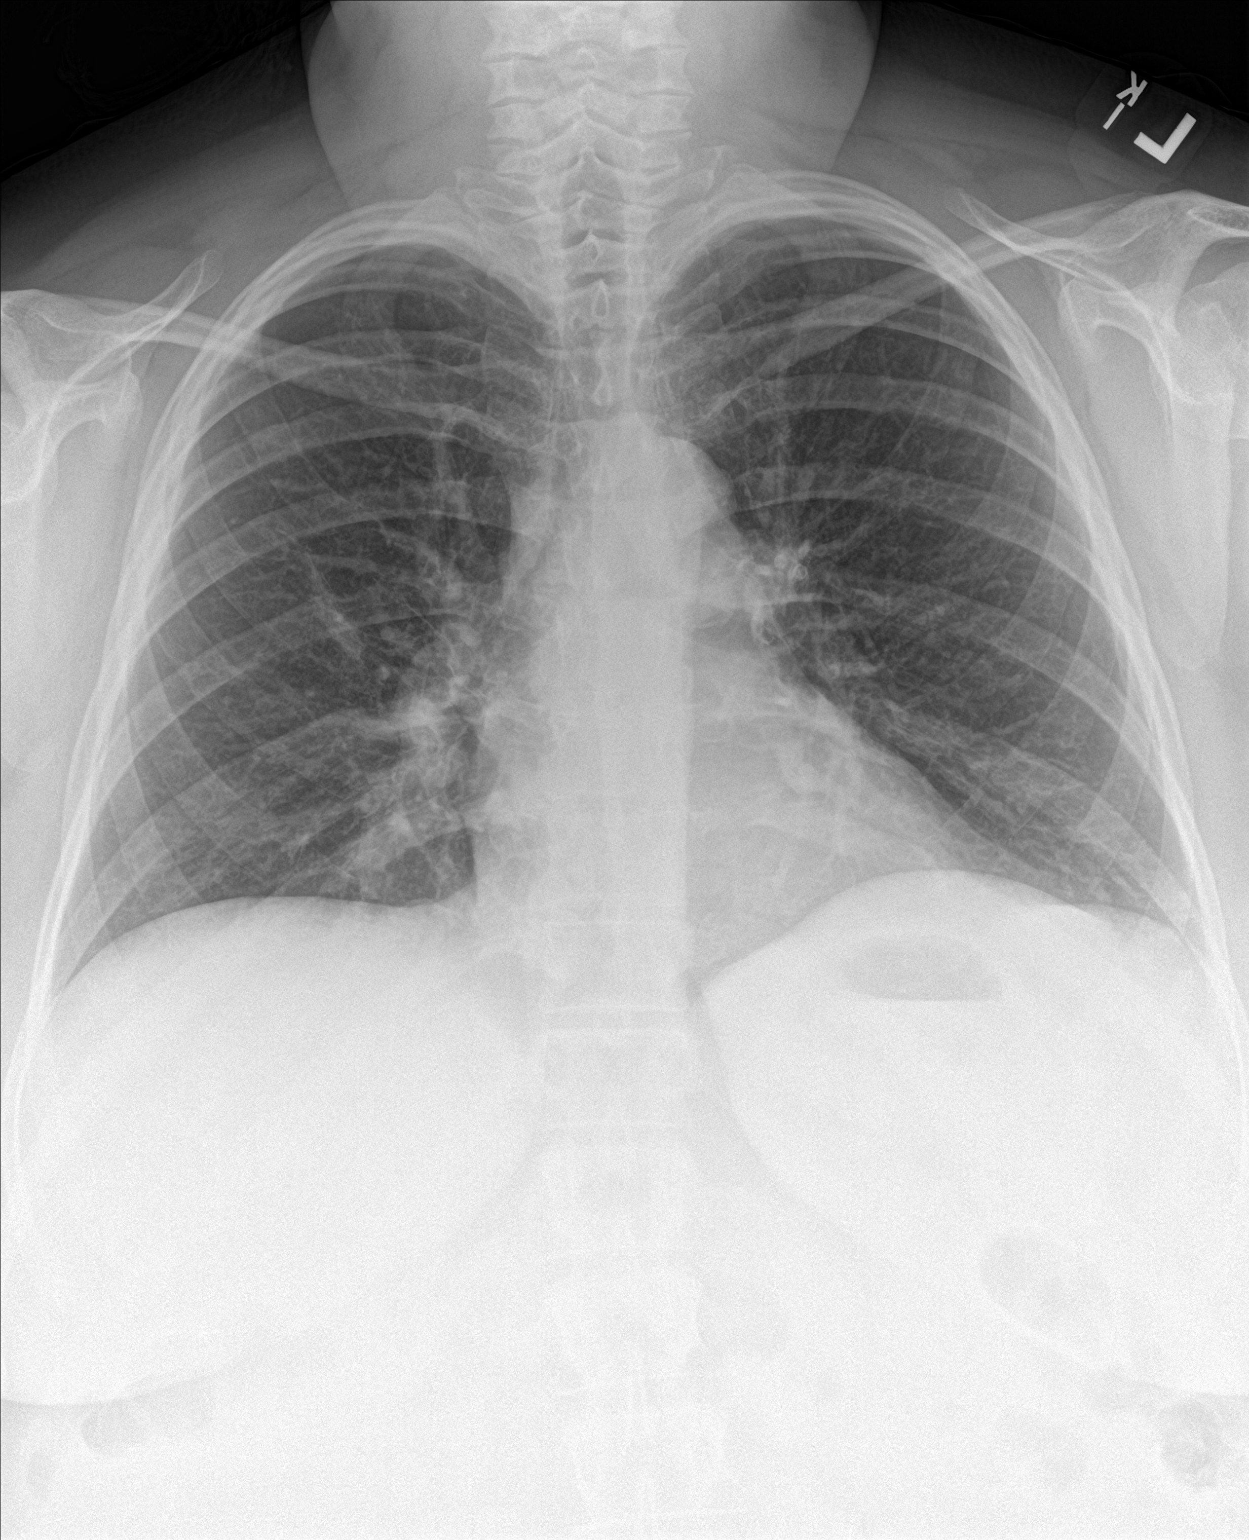

[chest lat]
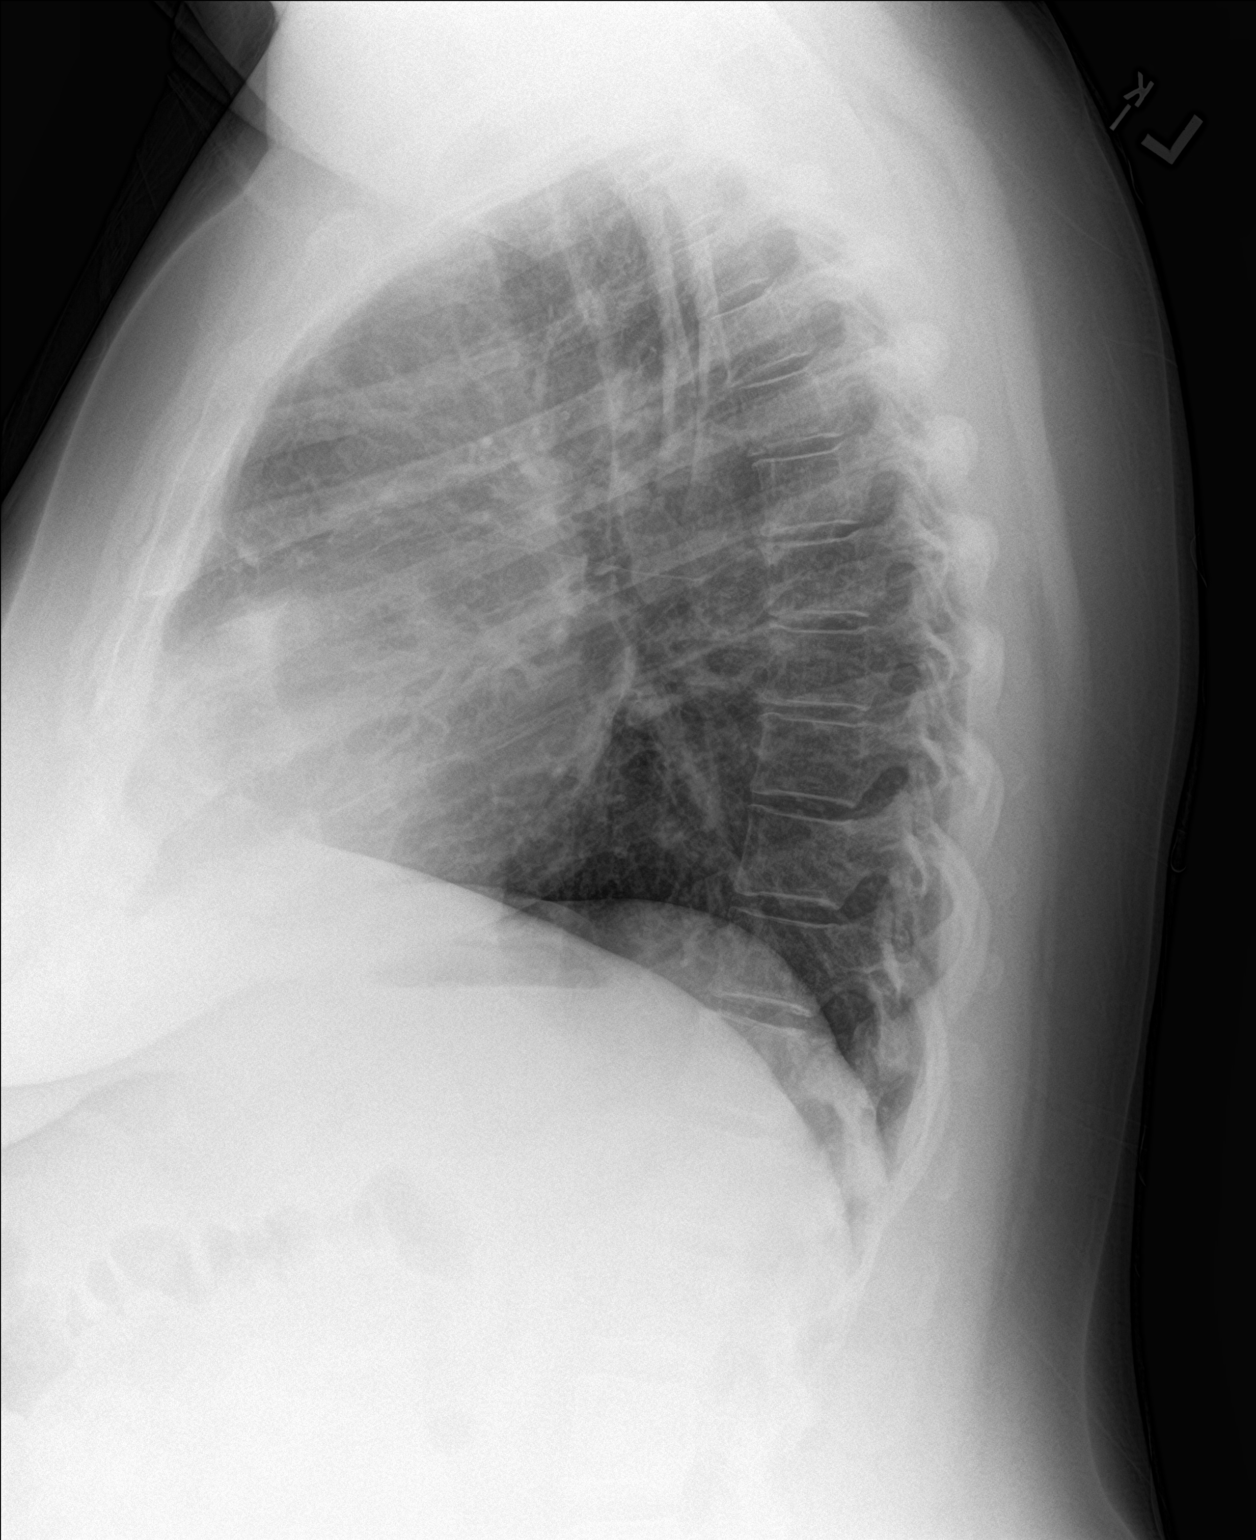

[2 of 2 positions shown; findings below may reference images not displayed]

FINDINGS: Cardiac shadow is within normal limits. The lungs are well aerated
bilaterally. The previously seen nodular opacities are again
identified but less prominent than that seen on prior CT and plain
film evaluation. No new focal abnormality is seen. No sizable
effusion is noted.
IMPRESSION: Continued improvement in right lung nodular change. Continued
follow-up is recommended

## 2017-06-13 NOTE — Patient Instructions (Addendum)
Please remember to go to the  x-ray department downstairs in the basement  for your tests - we will call you with the results when they are available.  All your symptoms should gradually resolve but if you develop chest pain in a different location call us back right away

## 2017-06-13 NOTE — Progress Notes (Signed)
Subjective:     Patient ID: Denise Barnett, female   DOB: [birth year],     MRN: [id number]    Brief patient profile:  57 yowf stopped smoking 2010 at wt 180  with dx of "bronchitis" / required and generally better and no inhalers needed since quit maint on bcps and on humira per Denise Barnett  For psoriasis abrupt onset very localized R posterolateral cp around [place] better lying down  s assoc fever/ cough > 03/11/17 to ER with 45 min with RML density anteriorly no effusion or postlateral densities  rx levaquin x 10 days but also sob neg ddimer reported but not in EPIC > 50% better when completed abx and gradually since then 100% improved off all opioids x mid April and off advil since May 2018 but due to persistent changed on cxr/ ct admitted:     Admit date: 04/15/2017  Discharge date: 04/19/2017    Recommendations for Outpatient Follow-up:   Follow up with PCP in 1-2 weeks  PCP Please obtain BMP/CBC, 2 view CXR in 1week,  (see Discharge instructions)   PCP Please follow up on the following pending results: Follow final bronchial culture results, fungal serology and autoimmune serology> ana neg/histo antigen neg / cypto ag neg / hiv neg         Chief Complaint  Patient presents with  . Cough     Brief history of present illness from the day of admission and additional interim summary    Denise Barnett a 46 y.o.female,With history of obstructive sleep apnea, diabetes mellitus who came to ED for abnormal imaging.                                                                 Hospital Course   1. Multiple bilateral nodular opacities noted on CT scan. Patient on Humira and relatively symptom-free. Pulmonary was been consulted, she has had no TB suggestive symptoms i.e. no night sweats, unintentional weight loss, appetite has been good. Echocardiogram stable. Blood cultures so far negative. Pulmonary ordered autoimmune serology along with fungal serology which are pending, she  status post bronchoscopy with sputum specimens sent for fungal cultures and routine cultures, all are pending. Discussed her case with pulmonary physician Denise Barnett this morning cleared for home discharge. She again is symptom-free. Request PCP to monitor final culture results along with final autoimmune serology results in a week. For now we recommend she holds Humira until fungal and atypical infections are ruled out.   2. History of psoriasis. Outpatient follow-up with dermatologist, likely will need to come off of Humira.   3. Morbid obesity and obstructive sleep apnea. Follow with PCP for weight loss, CPAP daily at bedtime.   4. Mild nausea 04/18/2017. Due to obstipation resolved..   5. DM type II. Continue home regimen follow with PCP.     Discharge diagnosis     Active Problems:   OSA (obstructive sleep apnea)   Interstitial pneumonia (HCC)   Psoriasis on humria x 1 year, 2 negative TB test   Former cigarette smoker age 74 - 79 1 ppd or less   Dyspnea on exertion, worse over last 12 weeks   Lung nodules   Community acquired pneumonia  04/18/17 BAL from [facility] Anterior segment and sent  for bacterial, fungal and AFB stain and culture> neg  04/27/17 NP eval Pos for residual changes esp RML on f/u cxr reviewed with Denise Barnett  rec Levaquin x 5 days Prednisone 10 mg take  4 each am x 2 days,   2 each am x 2 days,  1 each am x 2 days and stop    05/16/2017 1st [facility] office visit/ Denise Barnett   Chief Complaint  Patient presents with  . Follow-up    Pt states that she still has some occ right side pain. Her breathing has improved some. No new co's today.    Not limited by breathing from desired activities    rec Venous dopplers  > neg bilaterally   06/13/2017  f/u ov/Denise Barnett re:  prob CAP Chief Complaint  Patient presents with  . Follow-up    She has noticed pain in her right side again for the past wk.    the pain the right dates back to admit and never 100% resolved  and worse for a week seems with work/ movement but no pain with deep breath or cough or increased sob over baseline  No obvious day to day or daytime variability or assoc excess/ purulent sputum or mucus plugs or hemoptysis  or chest tightness, subjective wheeze or overt sinus or hb symptoms. No unusual exp hx or h/o childhood pna/ asthma or knowledge of premature birth.  Sleeping ok without nocturnal  or early am exacerbation  of respiratory  c/o's or need for noct saba. Also denies any obvious fluctuation of symptoms with weather or environmental changes or other aggravating or alleviating factors except as outlined above   Current Medications, Allergies, Complete Past Medical History, Past Surgical History, Family History, and Social History were reviewed in [employer] record.  ROS  The following are not active complaints unless bolded sore throat, dysphagia, dental problems, itching, sneezing,  nasal congestion or excess/ purulent secretions, ear ache,   fever, chills, sweats, unintended wt loss, classically pleuritic or exertional cp,  orthopnea pnd or leg swelling, presyncope, palpitations, abdominal pain, anorexia, nausea, vomiting, diarrhea  or change in bowel or bladder habits, change in stools or urine, dysuria,hematuria,  rash, arthralgias, visual complaints, headache, numbness, weakness or ataxia or problems with walking or coordination,  change in mood/affect or memory.                Objective:   Physical Exam   Obese amb wf  nad   06/13/2017          219   05/16/17 217 lb (98.4 kg)  04/27/17 218 lb 3.2 oz (99 kg)  04/18/17 217 lb (98.4 kg)    Vital signs reviewed  - Note on arrival 02 sats  100% on RA     HEENT: nl dentition, turbinates bilaterally, and oropharynx. Nl external ear canals without cough reflex   NECK :  without JVD/Nodes/TM/ nl carotid upstrokes bilaterally   LUNGS: no acc muscle use,  Nl contour chest which is clear to A and P  bilaterally without cough on insp or exp maneuvers- no chest wall tenderness on palpation   CV:  RRR  no s3 or murmur or increase in P2, and no edema   ABD:  Obese/ soft and nontender with nl inspiratory excursion in the supine position. No bruits or organomegaly appreciated, bowel sounds nl  MS:  Nl gait/ ext warm without deformities, calf tenderness, cyanosis or clubbing No obvious joint restrictions   SKIN:  warm and dry without lesions    NEURO:  alert, approp, nl sensorium with  no motor or cerebellar deficits apparent.     CXR PA and Lateral:   06/13/2017 :    I personally reviewed images and agree with radiology impression as follows:   Continued improvement in right lung nodular change. Continued follow-up is recommended      Assessment:

## 2017-06-14 NOTE — Assessment & Plan Note (Signed)
Onset of R post Pleuritic Pain abrupt 03/11/17 with neg d dimer rx as pna/ pt on humira > d/c'd -04/18/17 BAL from R[facility] Anterior segment and sent for bacterial, fungal and AFB stain and culture> neg rx levaquin x 15 days and pred x 6 days  - 05/16/2017 Wert f/u with improving cxr and ESR down to 18 from 30 in pt 100% clinically improved  - Venous dopplers 05/23/18 :  No evidence of deep vein thrombosis involving the right lower extremity and left lower extremity.  Clearly improving radiographically and though I was concerned about small peripheral PE's being missed and misdx as cap in obese pt  On bcps,  The venous dopplers are neg and she's doing better except for some mscp on R which is in same location as prev cp's so very unlikely to represent new pe statistically (never goes to same exact spot this peripherally twice) and no pna clinically or radiographically so follow this conservatively with f/u CT due in 07/2017 as planned  Discussed in detail all the  indications, usual  risks and alternatives  relative to the benefits with patient who agrees to proceed with conservative f/u as outlined   I had an extended discussion with the patient reviewing all relevant studies completed to date and  lasting 15 to 20 minutes of a 25 minute visit    Each maintenance medication was reviewed in detail including most importantly the difference between maintenance and prns and under what circumstances the prns are to be triggered using an action plan format that is not reflected in the computer generated alphabetically organized AVS.    Please see AVS for specific instructions unique to this visit that I personally wrote and verbalized to the the pt in detail and then reviewed with pt  by my nurse highlighting any  changes in therapy recommended at today's visit to their plan of care.

## 2017-06-14 NOTE — Progress Notes (Signed)
Spoke with pt and notified of results per Dr. Melvyn Novas. She states that she is already scheduled for a ct chest in August, and wants to know if she really needs to come in for 6 wk rov with cxr. Please advise, thanks

## 2017-06-14 NOTE — Progress Notes (Signed)
Spoke with patient and informed her to keep the CT appointment and to disregard the CXR. Pt verbalized understanding and did not have any questions. Nothing further is needed.

## 2017-06-14 NOTE — Assessment & Plan Note (Signed)
Body mass index is 38.86 kg/m.  -  trending up still  No results found for: TSH   Contributing to gerd risk/ doe/reviewed the need and the process to achieve and maintain neg calorie balance > defer f/u primary care including intermittently monitoring thyroid status

## 2017-07-05 ENCOUNTER — Telehealth: Payer: Self-pay | Admitting: Internal Medicine

## 2017-07-06 NOTE — Telephone Encounter (Signed)
Spoke to pt and gave her the auth# I was given by [employer] for her chest ct [id number] will call ins back and ck on this again /me

## 2017-07-06 NOTE — Telephone Encounter (Signed)
lmomtcb Denise Barnett ° °

## 2017-07-15 ENCOUNTER — Inpatient Hospital Stay: Admission: RE | Admit: 2017-07-15 | Payer: Managed Care, Other (non HMO) | Source: Ambulatory Visit

## 2017-07-19 ENCOUNTER — Telehealth: Payer: Self-pay | Admitting: Internal Medicine

## 2017-07-19 DIAGNOSIS — J849 Interstitial pulmonary disease, unspecified: Secondary | ICD-10-CM

## 2017-07-19 NOTE — Telephone Encounter (Signed)
Left message for patient to call back  

## 2017-07-20 NOTE — Telephone Encounter (Signed)
lmtcb

## 2017-07-20 NOTE — Telephone Encounter (Signed)
Pt returning call and can be reached @ [phone].Denise Barnett

## 2017-07-20 NOTE — Telephone Encounter (Signed)
Patient is returning phone call. Patient is at work. Please call her at 59.

## 2017-07-21 ENCOUNTER — Ambulatory Visit (INDEPENDENT_AMBULATORY_CARE_PROVIDER_SITE_OTHER)
Admission: RE | Admit: 2017-07-21 | Discharge: 2017-07-21 | Disposition: A | Discharge status: Home / Self Care | Payer: Managed Care, Other (non HMO) | Source: Ambulatory Visit | Attending: Acute Care | Admitting: Acute Care

## 2017-07-21 DIAGNOSIS — J849 Interstitial pulmonary disease, unspecified: Secondary | ICD-10-CM

## 2017-07-21 IMAGING — CT CT CHEST W/O CM
2 of 3 series · 13 of 36 positions shown, 16 images · non-contrast
Comparison: Chest x-ray [DATE].

CLINICAL DATA: 45-year-old female with history of intermittent
shortness of breath and right-sided chest pain. Follow-up for prior
abnormal chest CT.

EXAM:
CT CHEST WITHOUT CONTRAST
TECHNIQUE: Multidetector CT imaging of the chest was performed following the
standard protocol without IV contrast.

[Series 2: thorax · axial · 0.78mm/px · z∈[-296,-40]mm · 10 of 152 slices shown, 13 images]
[im 12/152  mediastinal]
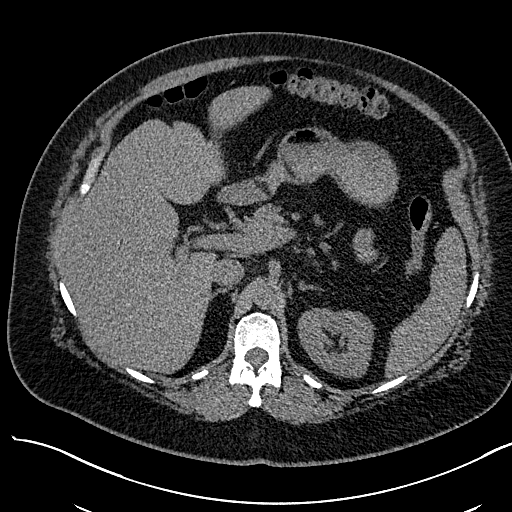
[im 12/152  lung]
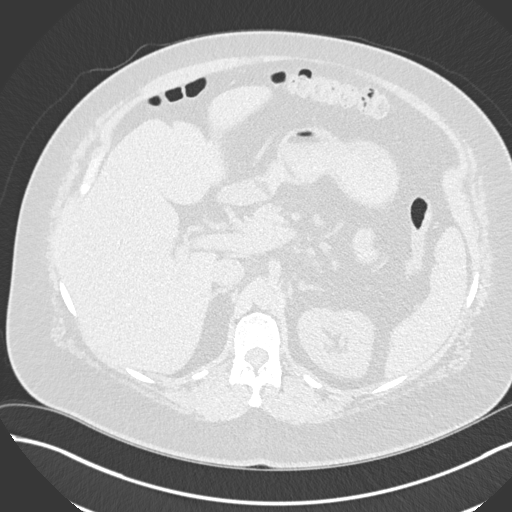
[im 23/152  lung]
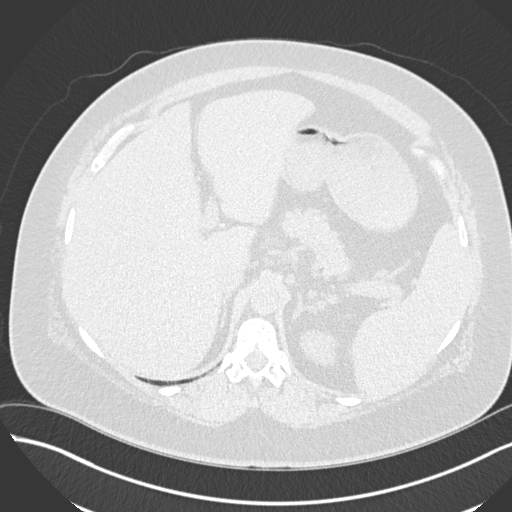
[im 40/152  lung]
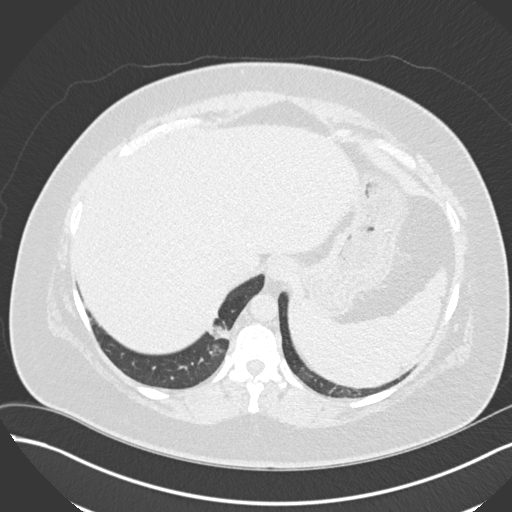
[im 56/152  lung]
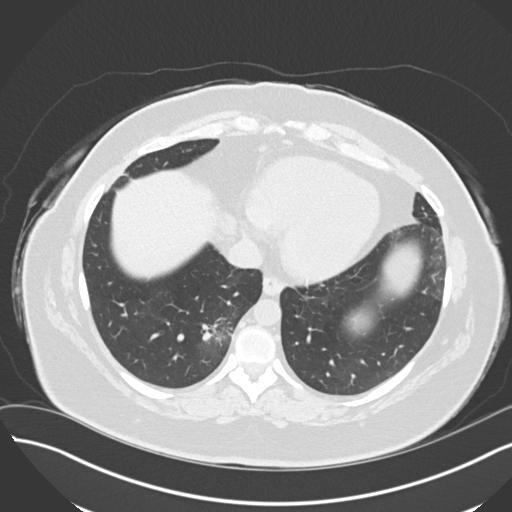
[im 68/152  mediastinal]
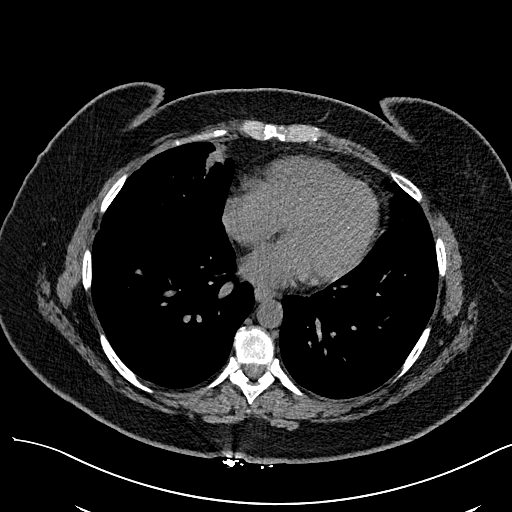
[im 68/152  lung]
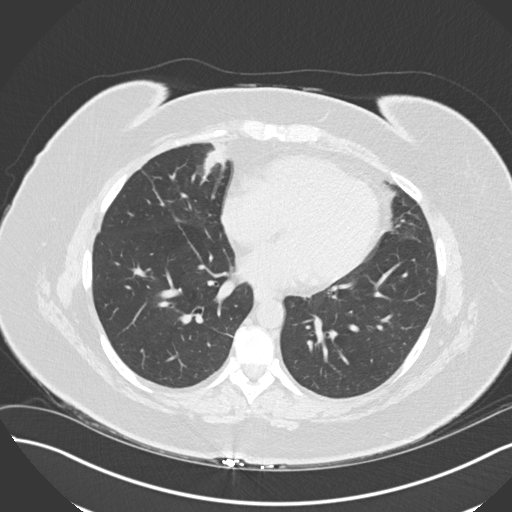
[im 84/152  lung]
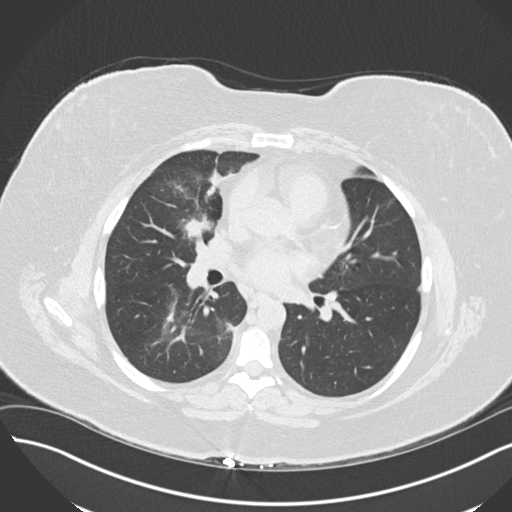
[im 96/152  lung]
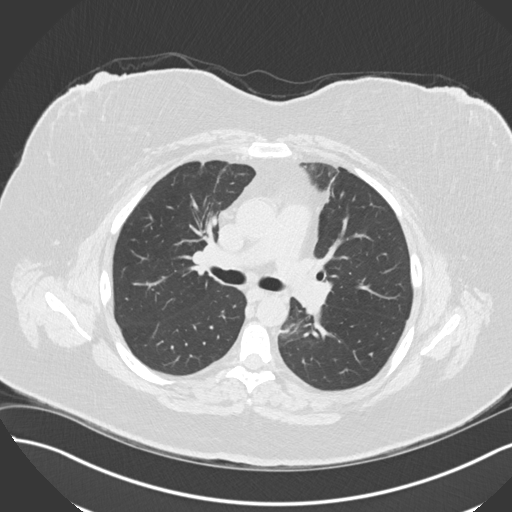
[im 112/152  lung]
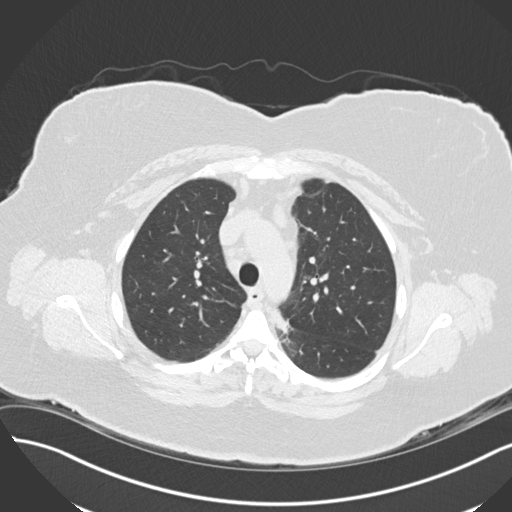
[im 129/152  mediastinal]
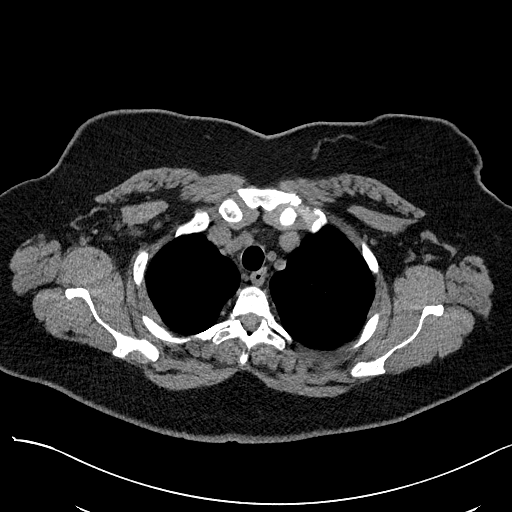
[im 129/152  lung]
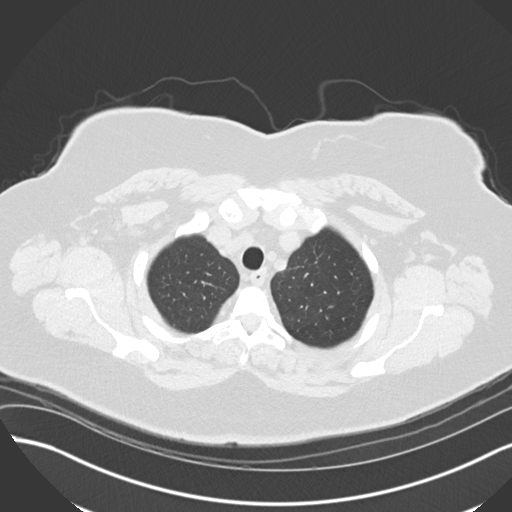
[im 140/152  lung]
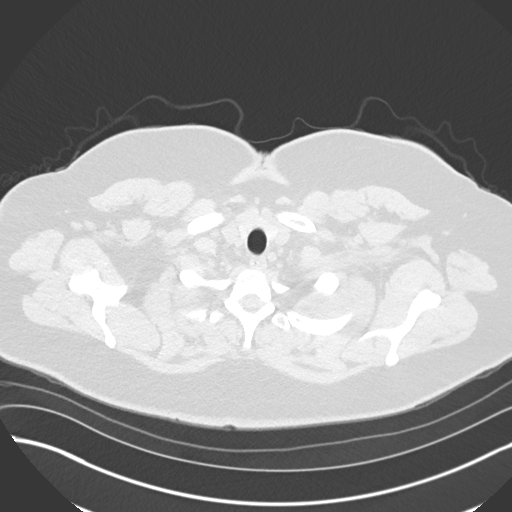

[Series 5: coronal · coronal · 0.59mm/px · 3 of 127 slices shown]
[im 26/127  lung]
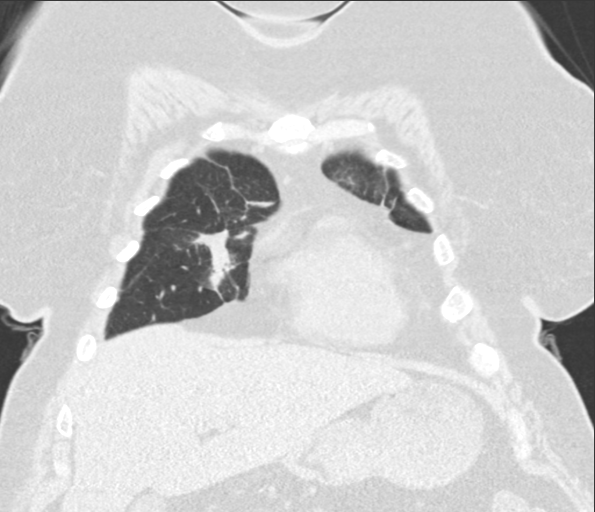
[im 51/127  lung]
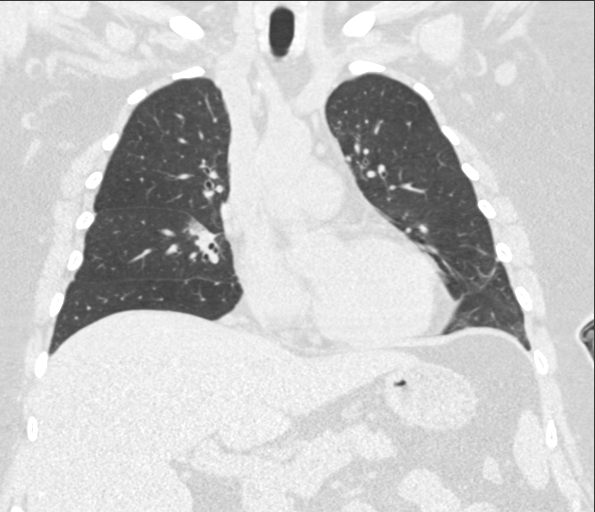
[im 76/127  lung]
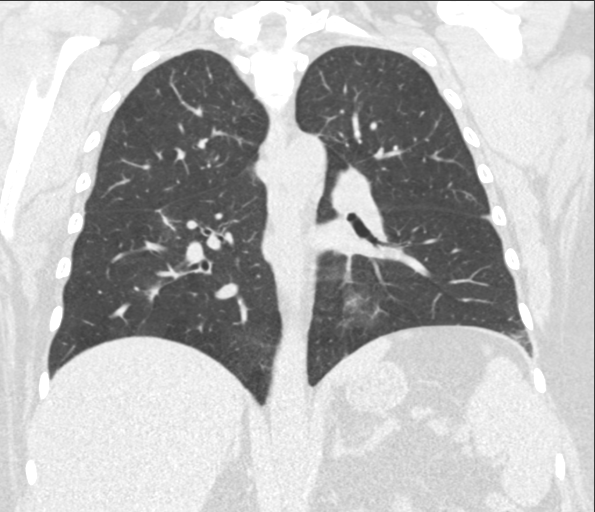

[13 of 36 positions shown; findings below may reference images not displayed]

FINDINGS: Cardiovascular: Heart size is normal. There is no significant
pericardial fluid, thickening or pericardial calcification. There is
aortic atherosclerosis, as well as atherosclerosis of the great
vessels of the mediastinum and the coronary arteries, including
calcified atherosclerotic plaque in the left anterior descending
coronary arteries.

Mediastinum/Nodes: Multiple borderline enlarged and mildly enlarged
mediastinal lymph nodes are noted. This includes prevascular lymph
nodes measuring up to 1 cm in short axis, and they low right juxta
paracardiac lymph node measuring 17 mm in short axis (axial image 95
of series 2). Esophagus is unremarkable in appearance. No axillary
lymphadenopathy.

Lungs/Pleura: Again noted are multiple nodular and mass-like areas
of apparent airspace consolidation in the lungs bilaterally (right
greater than left). Some of these lesions appear similar to the
prior study, while others have decreased in size and still others
have increased in size or are new. Specific examples are as follows.
3.9 x 2.5 cm lesion in the inferior aspect of the right upper lobe
abutting the minor fissure (axial image 72 of series 3) which has
shifted in configuration and appears less bulky than the prior
study, although overall measurements are very similar (previously
3.8 x 2.3 cm). New 1.7 x 1.6 cm lesion in the central aspect of the
right middle lobe (axial image 70 of series 3). Decreased size of
1.3 x 1.0 cm lesion in the medial right lower lobe (axial image 72
of series 3) previously 2.4 x 1.9 cm. New lesion measuring 1.4 x
cm in the right lower lobe (axial image 75 of series 3), which was
previously only an area of ill-defined ground-glass attenuation. New
lesion in the medial aspect of the superior segment of the left
lower lobe (axial image 44 of series 3) measuring 1.7 x 2.1 cm.
Several additional lesions are also noted. Most of these lesions
demonstrate peripheral ground-glass attenuation. No pleural
effusions.

Upper Abdomen: Mild diffuse low attenuation throughout the
visualized hepatic parenchyma, indicative of a background of hepatic
steatosis. Multiple borderline enlarged and mildly enlarged lymph
nodes are noted within the upper abdomen, largest of which is
incompletely visualized in the aortocaval nodal station measuring at
least 12 mm in short axis (axial image 152 of series 2).

Musculoskeletal: Multiple well-defined sclerotic lesions with narrow
zones of transition are again noted within the thoracic vertebral
bodies, similar to the prior study, favored to represent small bone
islands. There are no aggressive appearing lytic or blastic lesions
noted in the visualized portions of the skeleton.
IMPRESSION: 1. Waxing and waning pattern of multifocal mass-like and nodular
areas of apparent airspace consolidation throughout the lungs
bilaterally (right greater than left), as detailed above. Overall,
this process appears slightly progressive compared to the prior
examination. There is associated lymphadenopathy in the mediastinum
and upper abdomen. This appearance is highly unusual, and carries a
broad differential diagnosis which includes atypical infection,
pulmonary vasculitides, and neoplasm such as lymphoma (e.g.,
MALToma). Tissue sampling of one of these lesions is suggested to
establish a tissue diagnosis, if clinically appropriate.
2. Aortic atherosclerosis, in addition to left anterior descending
coronary artery disease. Please note that although the presence of
coronary artery calcium documents the presence of coronary artery
disease, the severity of this disease and any potential stenosis
cannot be assessed on this non-gated CT examination. Assessment for
potential risk factor modification, dietary therapy or pharmacologic
therapy may be warranted, if clinically indicated.
3. Hepatic steatosis.

Aortic Atherosclerosis ([7E]-[7E]).

## 2017-07-21 NOTE — Telephone Encounter (Signed)
Pt had ct chest today, wants to know if this CT shows any cause for the back pain she's been experiencing.  Pt states that her back pain has been worsening and is unsure if this is something related to her lungs or if she needs to follow up with PCP.  MW Please advise on ct results.  Thanks!   06/13/17 AVS: Instructions  Please remember to go to the  x-ray department downstairs in the basement  for your tests - we will call you with the results when they are available.   All your symptoms should gradually resolve but if you develop chest pain in a different location call us back right away

## 2017-07-21 NOTE — Telephone Encounter (Signed)
Called and spoke with pt and she is aware of MW recs.  appt scheduled with pt to see MW on 8/16 and pet scan has been ordered as well.

## 2017-07-21 NOTE — Telephone Encounter (Signed)
If the pain is only present with a breath and not between breaths then likely is related to the pulmonary problem - otherwise should see pcp about it   We will need to set up a  PET scan next to sort out what the source of the pulmonary densities is > go ahead and schedule next available to see me the end of that afternoon (so it can be read 1st)

## 2017-07-22 ENCOUNTER — Telehealth: Payer: Self-pay

## 2017-07-22 NOTE — Telephone Encounter (Signed)
Pt requesting results of yesterday's CT chest.  MW please advise.  Thanks!

## 2017-07-25 NOTE — Telephone Encounter (Signed)
Called and spoke with pt and she is aware of MW recs.  She has the PET scan on 8/20 and has the follow up appt with MW on 8/21

## 2017-07-25 NOTE — Telephone Encounter (Signed)
There is no diagnostic information on the ct scan and I am concerned we don't have a specific dx here at all so next step in PET and then regroup in office to sort out what to do next

## 2017-07-28 ENCOUNTER — Ambulatory Visit: Payer: Managed Care, Other (non HMO) | Admitting: Internal Medicine

## 2017-08-01 ENCOUNTER — Ambulatory Visit (HOSPITAL_COMMUNITY)
Admission: RE | Admit: 2017-08-01 | Discharge: 2017-08-01 | Disposition: A | Discharge status: Home / Self Care | Payer: Managed Care, Other (non HMO) | Source: Ambulatory Visit | Attending: Internal Medicine | Admitting: Internal Medicine

## 2017-08-01 ENCOUNTER — Telehealth: Payer: Self-pay | Admitting: Internal Medicine

## 2017-08-01 DIAGNOSIS — N9489 Other specified conditions associated with female genital organs and menstrual cycle: Secondary | ICD-10-CM | POA: Diagnosis not present

## 2017-08-01 DIAGNOSIS — J849 Interstitial pulmonary disease, unspecified: Secondary | ICD-10-CM | POA: Insufficient documentation

## 2017-08-01 LAB — GLUCOSE, CAPILLARY: GLUCOSE-CAPILLARY: 132 mg/dL — AB (ref 65–99)

## 2017-08-01 IMAGING — CT NM PET TUM IMG INITIAL (PI) SKULL BASE T - THIGH
1 of 8 series · 1 of 25 positions shown · non-contrast
Comparison: CT chest [DATE] and [DATE].

CLINICAL DATA: Initial treatment strategy for interstitial
pulmonary changes.

EXAM:
NUCLEAR MEDICINE PET SKULL BASE TO THIGH
TECHNIQUE: Template mCi F-18 FDG was injected intravenously. Full-ring PET
imaging was performed from the skull base to thigh after the
radiotracer. CT data was obtained and used for attenuation
correction and anatomic localization.
FASTING BLOOD GLUCOSE:  Value: 132 mg/dl

[Series 4: ct sk_thigh 5.0 b31f · axial · 5.0mm · 0.98mm/px · 1 of 216 slices shown]
[im 216/216  brain]
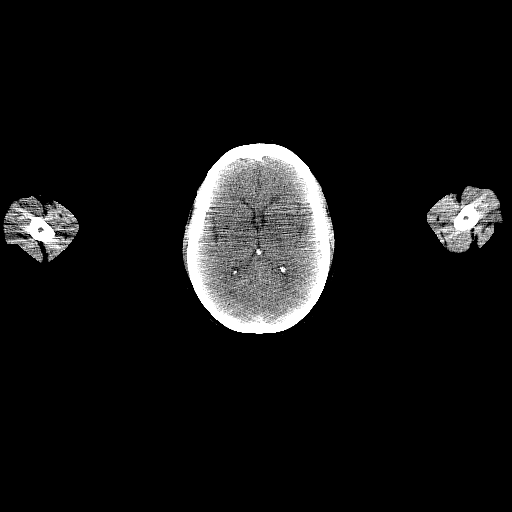

[1 of 25 positions shown; findings below may reference images not displayed]

FINDINGS: NECK: No hypermetabolic lymph nodes in the neck. CT images show no
acute findings.

CHEST: No hypermetabolic mediastinal, hilar or axillary lymph nodes.
There are resolving areas of peribronchovascular consolidation in
the lungs bilaterally, improved from [DATE] and without abnormal
hypermetabolism above blood pool. Heart is at the upper limits of
normal in size to mildly enlarged. No pericardial or pleural
effusion.

ABDOMEN/PELVIS: No abnormal hypermetabolism in the liver, adrenal
glands, spleen or pancreas. No hypermetabolic lymph nodes. Liver,
gallbladder, adrenal glands, kidneys, spleen, pancreas, stomach and
bowel are grossly unremarkable. A 16.9 x 21.7 cm cystic mass is seen
in the central anatomic pelvis with soft tissue along the inferior
left posterolateral margin (CT image 173). No associated abnormal
hypermetabolism. Lesion likely arises from the left ovary. Small to
borderline enlarged mesenteric lymph nodes and mesenteric haziness,
again, without abnormal hypermetabolism.

SKELETON: No abnormal osseous hypermetabolism.
IMPRESSION: 1. Resolving areas of peribronchovascular pulmonary parenchymal
consolidation without abnormal hypermetabolism, indicative of an
infectious or inflammatory process. Organizing pneumonia can also
have this appearance.
2. Large cystic mass in the anatomic pelvis, with peripheral soft
tissue, likely rising from the left ovary. Gynecological
consultation and subsequent ultrasound or MR pelvis recommended, as
malignancy cannot be excluded. These results will be called to the
ordering clinician or representative by the Radiologist Assistant,
and communication documented in the PACS or zVision Dashboard.

## 2017-08-01 MED ORDER — FLUDEOXYGLUCOSE F - 18 (FDG) INJECTION
10.7600 | Freq: Once | INTRAVENOUS | Status: AC | PRN
  Administered 2017-08-01: 10.76 via INTRAVENOUS

## 2017-08-01 NOTE — Telephone Encounter (Signed)
Patient returned call, CB is [phone] and will be there until 7:00 pm.

## 2017-08-01 NOTE — Telephone Encounter (Signed)
Pt is aware of results and voiced her understanding. Pt has been scheduled for 69mo rov with MW on 9/20 @ 8:45. OV for 8/21 has been canceled.  Pt states she will contact her OBGYN in regards to cyst. Nothing further needed.

## 2017-08-01 NOTE — Telephone Encounter (Signed)
Notes recorded by Tanda Rockers, MD on 08/01/2017 at 1:34 PM EDT Call patient : Study is c/w resolving lung nodules so nothing needed until return here in about another month but is the meantime there is a very large cyst on one of her ovaries that needs her gyn to eval > send copy  ---------------------------------- lmtcb x1 for pt.

## 2017-08-01 NOTE — Progress Notes (Signed)
LMTCB

## 2017-08-02 ENCOUNTER — Ambulatory Visit: Payer: Managed Care, Other (non HMO) | Admitting: Internal Medicine

## 2017-08-31 ENCOUNTER — Encounter: Payer: Self-pay | Admitting: Gynecologic Oncology

## 2017-08-31 ENCOUNTER — Ambulatory Visit: Payer: Managed Care, Other (non HMO) | Attending: Gynecologic Oncology | Admitting: Gynecologic Oncology

## 2017-08-31 VITALS — BP 149/74 | HR 91 | Temp 98.4°F | Resp 20 | Ht 63.0 in | Wt 217.5 lb

## 2017-08-31 DIAGNOSIS — G4733 Obstructive sleep apnea (adult) (pediatric): Secondary | ICD-10-CM | POA: Diagnosis not present

## 2017-08-31 DIAGNOSIS — N839 Noninflammatory disorder of ovary, fallopian tube and broad ligament, unspecified: Secondary | ICD-10-CM | POA: Diagnosis not present

## 2017-08-31 DIAGNOSIS — R1011 Right upper quadrant pain: Secondary | ICD-10-CM | POA: Diagnosis not present

## 2017-08-31 DIAGNOSIS — Z882 Allergy status to sulfonamides status: Secondary | ICD-10-CM | POA: Insufficient documentation

## 2017-08-31 DIAGNOSIS — E119 Type 2 diabetes mellitus without complications: Secondary | ICD-10-CM | POA: Insufficient documentation

## 2017-08-31 DIAGNOSIS — R1900 Intra-abdominal and pelvic swelling, mass and lump, unspecified site: Secondary | ICD-10-CM | POA: Insufficient documentation

## 2017-08-31 DIAGNOSIS — Z87891 Personal history of nicotine dependence: Secondary | ICD-10-CM | POA: Insufficient documentation

## 2017-08-31 DIAGNOSIS — J45909 Unspecified asthma, uncomplicated: Secondary | ICD-10-CM | POA: Insufficient documentation

## 2017-08-31 DIAGNOSIS — Z7984 Long term (current) use of oral hypoglycemic drugs: Secondary | ICD-10-CM | POA: Insufficient documentation

## 2017-08-31 DIAGNOSIS — Z880 Allergy status to penicillin: Secondary | ICD-10-CM | POA: Insufficient documentation

## 2017-08-31 DIAGNOSIS — Z8249 Family history of ischemic heart disease and other diseases of the circulatory system: Secondary | ICD-10-CM | POA: Diagnosis not present

## 2017-08-31 DIAGNOSIS — Z6838 Body mass index (BMI) 38.0-38.9, adult: Secondary | ICD-10-CM | POA: Diagnosis not present

## 2017-08-31 DIAGNOSIS — Z79899 Other long term (current) drug therapy: Secondary | ICD-10-CM | POA: Insufficient documentation

## 2017-08-31 NOTE — Progress Notes (Signed)
Consult Note: Gyn-Onc  Ginette JIMI GIZA 46 y.o. female  CC:  Chief Complaint  Patient presents with  . Pelvic mass    [id number] is seen today in consultation at the request of Dr. Jerelyn Charles. Consulting physician Dr. Melvyn Novas  Patient is a very pleasant 46 year old gravida 1 para 1 whose next cycle is due soon. Her cycles are fairly regular as she is on birth control pills but she's had a longer cycle last month which lasted 7 days. She has had some intermittent abdominal pain in the right upper quadrant for the past few months. It was hard for her to know that was related to her lungs are other issues. She began having issues with her lungs and was evaluated by Dr. Melvyn Novas as she had right back pain that was worse with deep inspiration. Patient was seen on 03/11/2017 with right lateral chest pain at that time chest x-ray showed small postinfectious pneumatocyst, patient was discharged on Levaquin. She had a follow-up chest x-ray and  CT chest done which showed progression of the opacities visualized on 03/11/2017. She was subsequently admitted and have really been on and off antibiotics as well as steroids and inhalers. She has a history of sleep apnea. Ultimately she had a repeat CT of the chest done, results of which are below.  CT chest 8/18: IMPRESSION: 1. Waxing and waning pattern of multifocal mass-like and nodular areas of apparent airspace consolidation throughout the lungs bilaterally (right greater than left), as detailed above. Overall, this process appears slightly progressive compared to the prior examination. There is associated lymphadenopathy in the mediastinum and upper abdomen. This appearance is highly unusual, and carries a broad differential diagnosis which includes atypical infection, pulmonary vasculitides, and neoplasm such as lymphoma (e.g., MALToma). Tissue sampling of one of these lesions is suggested to establish a tissue diagnosis, if clinically appropriate. 2. Aortic  atherosclerosis, in addition to left anterior descending coronary artery disease. Please note that although the presence of coronary artery calcium documents the presence of coronary arterym disease, the severity of this disease and any potential stenosis cannot be assessed on this non-gated CT examination. Assessment for potential risk factor modification, dietary therapy or pharmacologic therapy may be warranted, if clinically indicated. 3. Hepatic steatosis. CHEST: No hypermetabolic mediastinal, hilar or axillary lymph nodes. There are resolving areas of peribronchovascular consolidation in the lungs bilaterally, improved from 07/21/2017 and without abnormal hypermetabolism above blood pool. Heart is at the upper limits of normal in size to mildly enlarged. No pericardial or pleural effusion.  As a result of the lesions on her chest CT, she subsequently underwent a PET/CT.  PET CT 8/18 ABDOMEN/PELVIS: No abnormal hypermetabolism in the liver, adrenal glands, spleen or pancreas. No hypermetabolic lymph nodes. Liver, gallbladder, adrenal glands, kidneys, spleen, pancreas, stomach and bowel are grossly unremarkable. A 16.9 x 21.7 cm cystic mass is seen in the central anatomic pelvis with soft tissue along the inferior left posterolateral margin (CT image 173). No associated abnormal hypermetabolism. Lesion likely arises from the left ovary. Small to borderline enlarged mesenteric lymph nodes and mesenteric haziness, again, without abnormal hypermetabolism.  SKELETON: No abnormal osseous hypermetabolism.  IMPRESSION: 1. Resolving areas of peribronchovascular pulmonary parenchymal consolidation without abnormal hypermetabolism, indicative of an infectious or inflammatory process. Organizing pneumonia can alsohave this appearance. 2. 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   She was seen by Dr. Carlis Abbott in follow-up of the mass noted on CT  scan. She underwent an ultrasound on September 7 revealed a cystic mass measuring 19.4 x 15.9 x 20.2 cm with internal echoes. There is no flow. She had a CA-125 of 22.2 and a CEA of 0.8. Her hemoglobin A1c was 6.1. She does have up-to-date Pap smears as well as mammograms. Her mammogram was in May 2018 and was unremarkable. In retrospect she does note that she has had some early satiety for about the past month but has not lost any weight. She had one episode of emesis and she thought that it was related to eating too much. She has always had fairly loose-type stools but more recently she's had increased stools to up to 5 per day. She's not sure of this related to her metformin or not. She does have a family history of cancer in that her paternal grandfather had lung cancer and maternal grandfather had ENT cancer. There is no GYN cancers in the family. She quit smoking 8 years ago. Of note, she is seeing Dr. Melvyn Novas tomorrow in follow-up.  Review of Systems: Constitutional: + early satiety.  Denies fever. Skin: + psoriasis Cardiovascular: No chest pain, shortness of breath Pulmonary: +cough  Gastro Intestinal: Reporting intermittent lower abdominal soreness.  GI symptoms as above Genitourinary: No frequency, urgency, or dysuria.  Denies vaginal bleeding other than with menses.   Current Meds:  Outpatient Encounter Prescriptions as of 08/31/2017  Medication Sig  . cyclobenzaprine (FLEXERIL) 10 MG tablet Take 1 tablet by mouth 3 (three) times daily as needed.  . meloxicam (MOBIC) 15 MG tablet Take 1 tablet by mouth 2 (two) times daily.  . metFORMIN (GLUCOPHAGE) 500 MG tablet Take 500 mg by mouth 2 (two) times daily with a meal.   . Norgestimate-Ethinyl Estradiol Triphasic 0.18/0.215/0.25 MG-35 MCG tablet Take 1 tablet by mouth daily.   No facility-administered encounter medications on file as of 08/31/2017.     Allergy:  Allergies  Allergen Reactions  . Bactrim [Sulfamethoxazole-Trimethoprim] Rash   . Penicillins Rash    Social Hx:   Social History   Social History  . Marital status: Single    Spouse name: N/A  . Number of children: 1  . Years of education: 76   Occupational History  . [employer]   Social History Main Topics  . Smoking status: Former Smoker    Packs/day: 0.75    Years: 20.00    Quit date: 2010  . Smokeless tobacco: Never Used  . Alcohol use No  . Drug use: No  . Sexual activity: Yes   Other Topics Concern  . Not on file   Social History Narrative  . No narrative on file    Past Surgical Hx:  Past Surgical History:  Procedure Laterality Date  . none    . VIDEO BRONCHOSCOPY Bilateral 04/18/2017   Procedure: VIDEO BRONCHOSCOPY WITHOUT FLUORO;  Surgeon: Rush Farmer, MD;  Location: [facility] ENDOSCOPY;  Service: Endoscopy;  Laterality: Bilateral;    Past Medical Hx:  Past Medical History:  Diagnosis Date  . Asthma   . Bronchitis   . Diabetes mellitus without complication ([place])   . Lichen planus   . OSA (obstructive sleep apnea) 12/25/2013  . Pneumonia     Oncology Hx:   No history exists.    Family Hx:  Family History  Problem Relation Age of Onset  . Sleep apnea Mother   . Heart attack Brother     Vitals:  Blood pressure (!) 149/74, pulse 91, temperature  98.4 F (36.9 C), temperature source Oral, resp. rate 20, height 5\' 3"  (1.6 m), weight 217 lb 8 oz (98.7 kg), SpO2 98 %.  Physical Exam: Well-nourished vulva female in no acute distress  Neck: Supple, no lymphadenopathy, no thyromegaly  Lungs: Clear to auscultation bilaterally.  Cardiac: Regular rate and rhythm.  Abdomen: Morbidly obese, soft, nontender, not tympanitic. There is an abdominal mass palpated midway between the umbilicus and the sternum. It is nontender. Exam is somewhat limited by habitus.  Groins: No lymphadenopathy.  Extremities: Psoriasis. No other skin lesions. No edema.  Pelvic: External genitalia within normal limits. Bimanual  examination the cervix is palpably normal. I cannot appreciate the size of the uterus. There is a large abdominal mass that is ballotable. It is outside the true pelvis. On rectal examination there is no nodularity.  Assessment/Plan: 46 year old with large simple appearing adnexal mass that does appear to be arising from the left ovary. She has normal tumor markers no evidence of any metastatic disease. This is most consistent with a cystadenoma. I discussed with her as it does appear to be homogeneous with no nodularity that we could proceed with a mini laparotomy and controlled drainage of the mass with subsequent unilateral salpingo-oophorectomy. She understands if the mass is benign that will also remove the contralateral oral fallopian tube as a cancer risk reduction strategy and conclude the procedure. Should it be a malignancy, we would need to proceed with a larger incision and at that time would most likely proceed with a total abdominal hysterectomy, bilateral salpingo-oophorectomy and appropriate staging.  Risks of surgery including but are not limited to bleeding, infection, injury to surrounding organs and thromboembolic disease were discussed with the patient.  She is seeing Dr. Melvyn Novas tomorrow. We will get him a copy of our note from today. We do ask that if he has any other recommendations for her in the perioperative period with regards to her pulmonary issues to please notify us. She is tentatively scheduled for surgery on October 4 with Dr. Everitt Amber. She knows that she'll have the opportunity to meet with Dr. Denman George earlier that day.  Her questions were elicited in answer to her satisfaction.  It was a pleasure to participate the care of this very pleasant patient.  Corderius Saraceni A., MD 08/31/2017, 1:57 PM

## 2017-08-31 NOTE — Patient Instructions (Addendum)
Preparing for your Surgery  Plan for surgery on September 15, 2017 with Dr. Everitt Amber at [facility] will be scheduled for an exploratory laparotomy, resection of pelvic mass, possible staging.   Pre-operative Testing -You will receive a phone call from presurgical testing at [facility] to arrange for a pre-operative testing appointment before your surgery.  This appointment normally occurs one to two weeks before your scheduled surgery.   -Bring your insurance card, copy of an advanced directive if applicable, medication list  -At that visit, you will be asked to sign a consent for a possible blood transfusion in case a transfusion becomes necessary during surgery.  The need for a blood transfusion is rare but having consent is a necessary part of your care.     -You should not be taking blood thinners or aspirin at least ten days prior to surgery unless instructed by your surgeon.  Day Before Surgery at [place] will be asked to take in a light diet the day before surgery.  Avoid carbonated beverages.  You will be advised to have nothing to eat or drink after midnight the evening before.    Eat a light diet the day before surgery.  Examples including soups, broths, toast, yogurt, mashed potatoes.  Things to avoid include carbonated beverages (fizzy beverages), raw fruits and raw vegetables, or beans.   If your bowels are filled with gas, your surgeon will have difficulty visualizing your pelvic organs which increases your surgical risks.  Your role in recovery Your role is to become active as soon as directed by your doctor, while still giving yourself time to heal.  Rest when you feel tired. You will be asked to do the following in order to speed your recovery:  - Cough and breathe deeply. This helps toclear and expand your lungs and can prevent pneumonia. You may be given a spirometer to practice deep breathing. A staff member will show you how to use  the spirometer. - Do mild physical activity. Walking or moving your legs help your circulation and body functions return to normal. A staff member will help you when you try to walk and will provide you with simple exercises. Do not try to get up or walk alone the first time. - Actively manage your pain. Managing your pain lets you move in comfort. We will ask you to rate your pain on a scale of zero to 10. It is your responsibility to tell your doctor or nurse where and how much you hurt so your pain can be treated.  Special Considerations -If you are diabetic, you may be placed on insulin after surgery to have closer control over your blood sugars to promote healing and recovery.  This does not mean that you will be discharged on insulin.  If applicable, your oral antidiabetics will be resumed when you are tolerating a solid diet.  -Your final pathology results from surgery should be available by the Friday after surgery and the results will be relayed to you when available.  -Dr. Lahoma Crocker is the Surgeon that assists your GYN Oncologist with surgery.  The next day after your surgery you will either see your GYN Oncologist or Dr. Lahoma Crocker.   Blood Transfusion Information WHAT IS A BLOOD TRANSFUSION? A transfusion is the replacement of blood or some of its parts. Blood is made up of multiple cells which provide different functions.  Red blood cells carry oxygen and are used for blood loss  replacement.  White blood cells fight against infection.  Platelets control bleeding.  Plasma helps clot blood.  Other blood products are available for specialized needs, such as hemophilia or other clotting disorders. BEFORE THE TRANSFUSION  Who gives blood for transfusions?   You may be able to donate blood to be used at a later date on yourself (autologous donation).  Relatives can be asked to donate blood. This is generally not any safer than if you have received blood from a  stranger. The same precautions are taken to ensure safety when a relative's blood is donated.  Healthy volunteers who are fully evaluated to make sure their blood is safe. This is blood bank blood. Transfusion therapy is the safest it has ever been in the practice of medicine. Before blood is taken from a donor, a complete history is taken to make sure that person has no history of diseases nor engages in risky social behavior (examples are intravenous drug use or sexual activity with multiple partners). The donor's travel history is screened to minimize risk of transmitting infections, such as malaria. The donated blood is tested for signs of infectious diseases, such as HIV and hepatitis. The blood is then tested to be sure it is compatible with you in order to minimize the chance of a transfusion reaction. If you or a relative donates blood, this is often done in anticipation of surgery and is not appropriate for emergency situations. It takes many days to process the donated blood. RISKS AND COMPLICATIONS Although transfusion therapy is very safe and saves many lives, the main dangers of transfusion include:   Getting an infectious disease.  Developing a transfusion reaction. This is an allergic reaction to something in the blood you were given. Every precaution is taken to prevent this. The decision to have a blood transfusion has been considered carefully by your caregiver before blood is given. Blood is not given unless the benefits outweigh the risks.

## 2017-09-01 ENCOUNTER — Encounter: Payer: Self-pay | Admitting: Internal Medicine

## 2017-09-01 ENCOUNTER — Ambulatory Visit (INDEPENDENT_AMBULATORY_CARE_PROVIDER_SITE_OTHER): Payer: Managed Care, Other (non HMO) | Admitting: Internal Medicine

## 2017-09-01 ENCOUNTER — Ambulatory Visit (INDEPENDENT_AMBULATORY_CARE_PROVIDER_SITE_OTHER)
Admission: RE | Admit: 2017-09-01 | Discharge: 2017-09-01 | Disposition: A | Discharge status: Home / Self Care | Payer: Managed Care, Other (non HMO) | Source: Ambulatory Visit | Attending: Internal Medicine | Admitting: Internal Medicine

## 2017-09-01 VITALS — BP 128/80 | HR 83 | Ht 63.0 in | Wt 217.0 lb

## 2017-09-01 DIAGNOSIS — R918 Other nonspecific abnormal finding of lung field: Secondary | ICD-10-CM | POA: Diagnosis not present

## 2017-09-01 DIAGNOSIS — R109 Unspecified abdominal pain: Secondary | ICD-10-CM | POA: Insufficient documentation

## 2017-09-01 DIAGNOSIS — R1084 Generalized abdominal pain: Secondary | ICD-10-CM | POA: Diagnosis not present

## 2017-09-01 IMAGING — DX DG CHEST 2V
2 series · 2 of 2 positions shown · non-contrast
Comparison: CT chest of [DATE] and PET-CT of [DATE], chest
x-ray of [DATE]

CLINICAL DATA: Shortness of breath, former smoking history

EXAM:
CHEST  2 VIEW

[chest pa]
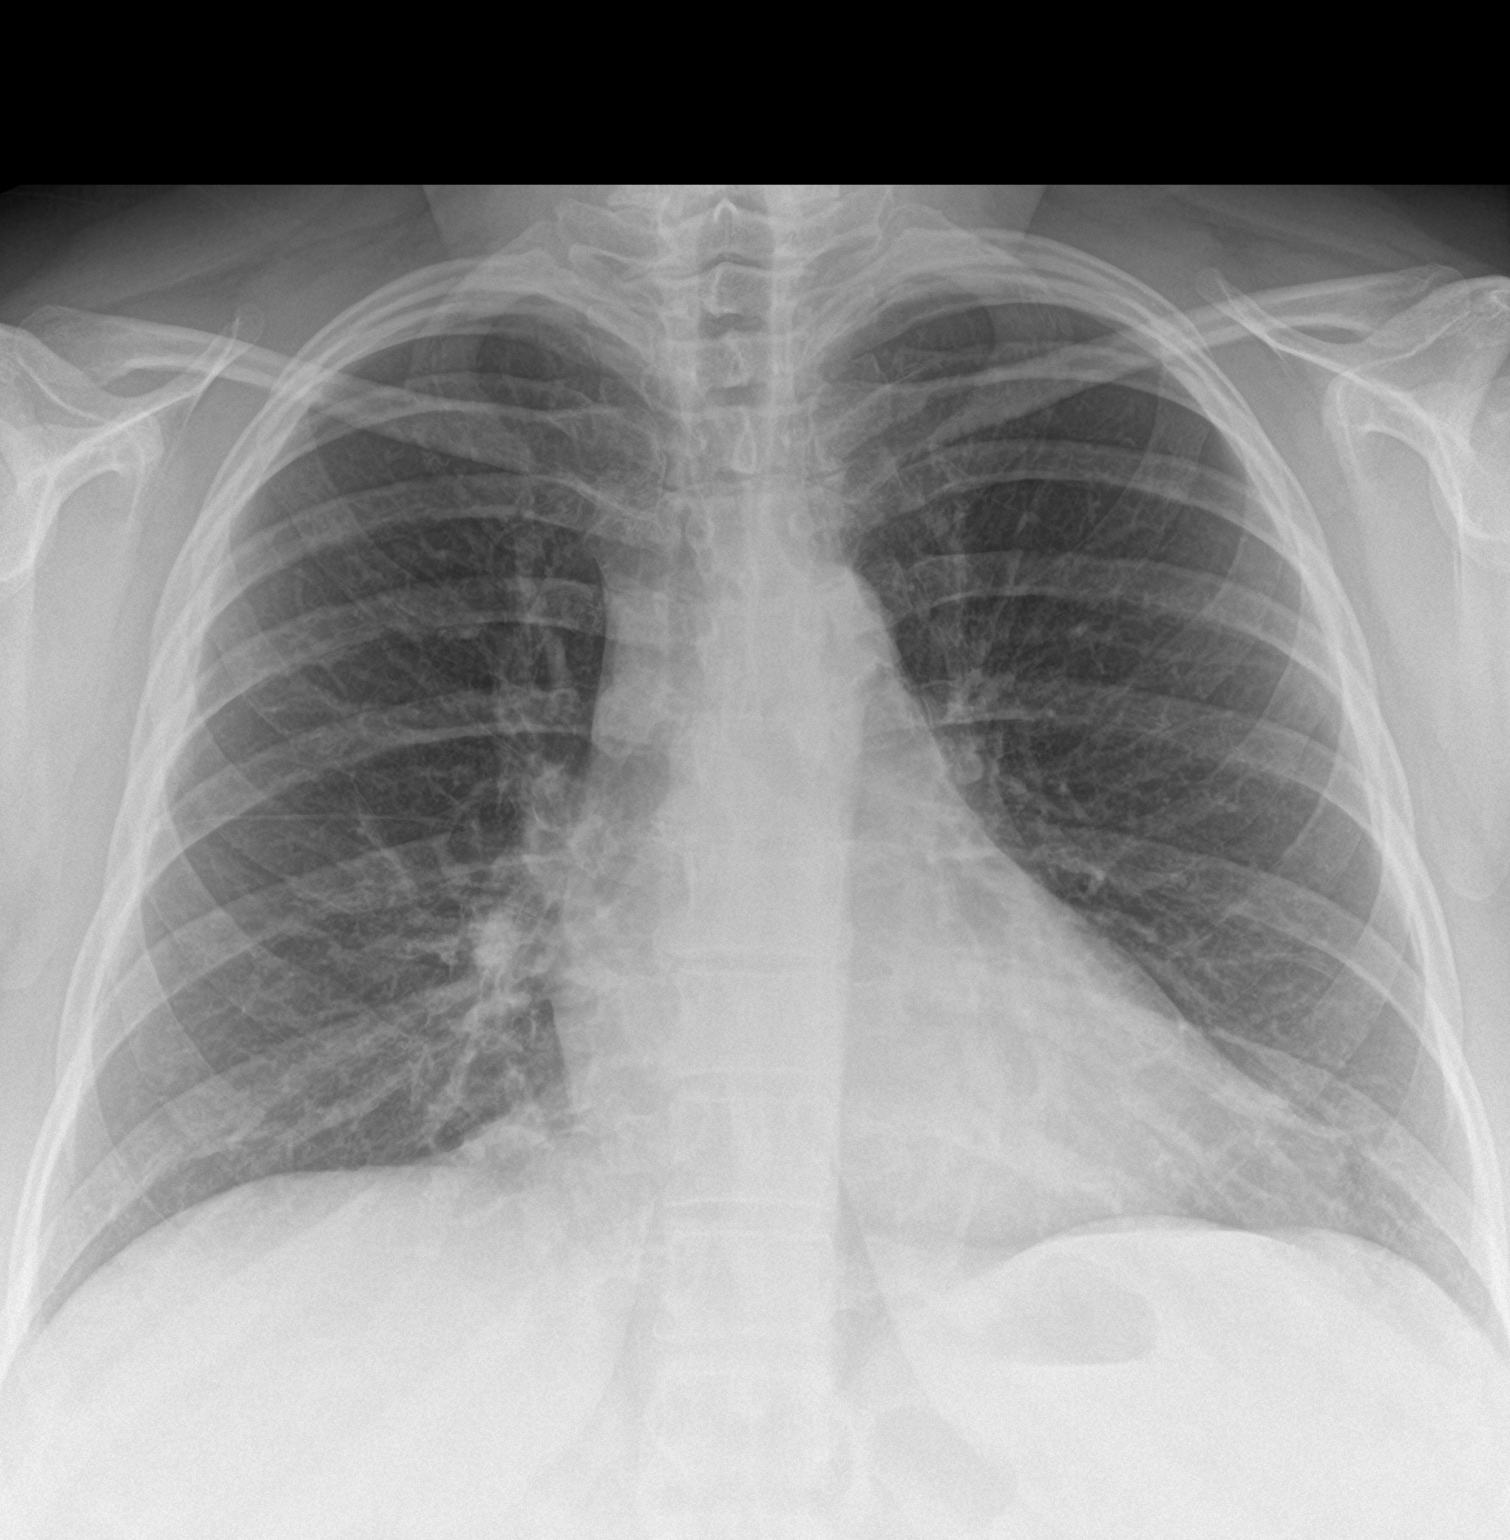

[chest lat]
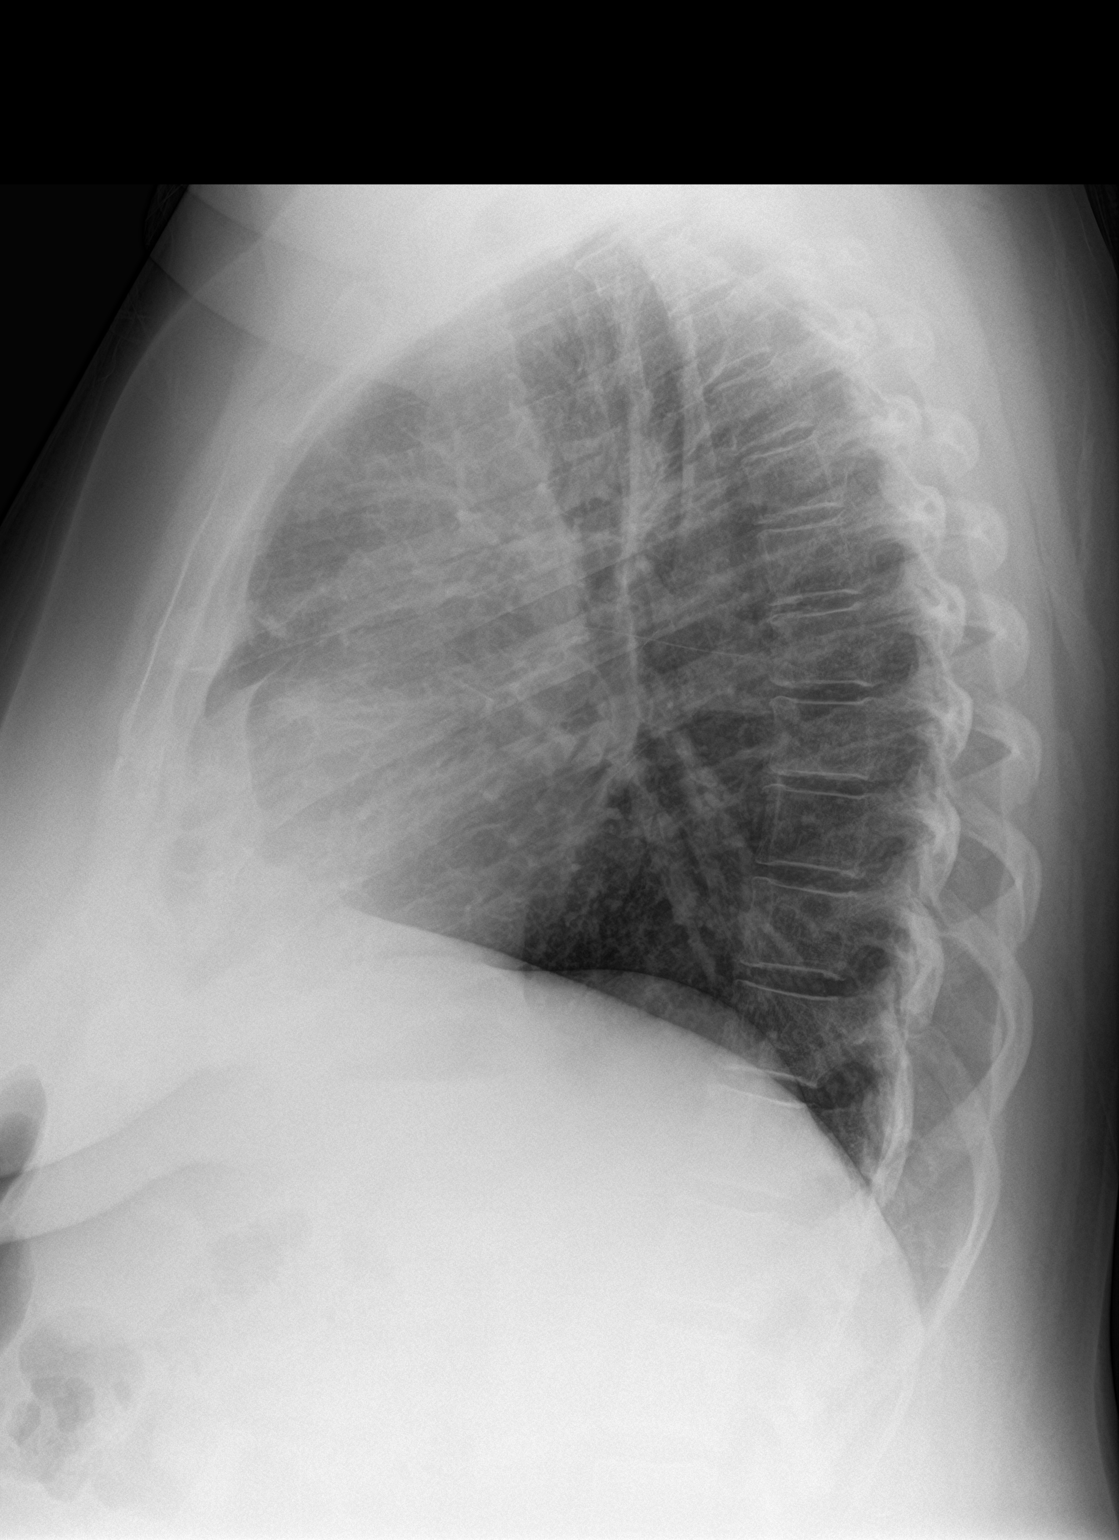

[2 of 2 positions shown; findings below may reference images not displayed]

FINDINGS: The foci of opacity noted bilaterally by CT chest are not seen by
chest x-ray. No developing pneumonia or pleural effusion is noted.
Mediastinal and hilar contours are unremarkable. The heart is within
normal limits in size. No bony abnormality is seen.
IMPRESSION: No active cardiopulmonary disease is seen by chest x-ray.

## 2017-09-01 NOTE — Progress Notes (Signed)
Left her a detailed msg with results

## 2017-09-01 NOTE — Assessment & Plan Note (Signed)
Classic subdiaphragmatic pain pattern suggests ibs:  Stereotypical, migratory with a very limited distribution of pain locations, daytime, not usually exacerbated by exercise  or coughing, worse in sitting position, frequently associated with generalized abd bloating, not as likely to be present supine due to the dome effect of the diaphragm which  is  canceled in that position. Frequently these patients have had multiple negative GI workups and CT scans.  Treatment consists of avoiding foods that cause gas (especially boiled eggs, mexcican food but especially  beans and undercooked vegetables like  spinach and some salads)  and citrucel 1 heaping tsp twice daily with a large glass of water.   Most likely the problem will resolve once the ovarian cyst mass effect is eliminated so this is certainly not an indication for additional preop eval.

## 2017-09-01 NOTE — Assessment & Plan Note (Signed)
Body mass index is 38.44 kg/m.  -  trending down sltly No results found for: TSH   Contributing to gerd risk/ doe/reviewed the need and the process to achieve and maintain neg calorie balance > defer f/u primary care including intermittently monitoring thyroid status

## 2017-09-01 NOTE — Progress Notes (Signed)
Subjective:     Patient ID: Denise Barnett, female   DOB: Mar 21, 1971,     MRN: [id number]    Brief patient profile:  83 yowf quit smoking [id number] with MO complicated by DM and osa on cpap per neuro        History of Present Illness  Onset of R post Pleuritic Pain abrupt 03/11/17 with neg d dimer rx as pna/ pt on humira > d/c'd -04/18/17 BAL from [facility] Anterior segment and sent for bacterial, fungal and AFB stain and culture> neg rx levaquin x 15 days and pred x 6 days  - 05/16/2017 Ashelynn Marks f/u with improving cxr and ESR down to 18 from 30 in pt 100% clinically improved  - Venous dopplers 05/23/18 :  No evidence of deep vein thrombosis involving the right lower extremity and left lower extremity. - HRCT  07/21/17  Non dx > rec PET  08/01/2017 Resolving areas of peribronchovascular pulmonary parenchymal consolidation without abnormal hypermetabolism, indicative of an infectious or inflammatory process. Organizing pneumonia can also have this appearance. 2. 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 Gynecological consultation and subsequent ultrasound or MR pelvis recommended, as malignancy cannot be excluded > rec 08/01/2017  Gyn > w/u complete 08/31/17  > needs oopharectomy    09/01/2017  f/u ov/Magic Mohler re: MPNs / preop clearance  Chief Complaint  Patient presents with  . Follow-up    She will be having surgery to remove pelvic mass 09/15/17 with Dr Denman George. She is needing pulmonary clearance for this. She states not having any CP and her breathing is doing fine.   original R post cp completely gone and good activity tol s coughing  New problem bilateral upper abd pain x sev months secs to minutes sitting, never sleeping - migrates side to side in terms of intensity assoc with gen abd bloating    Not limited by breathing from desired activities    No obvious day to day or daytime variability or assoc excess/ purulent sputum or mucus plugs  or hemoptysis or cp or chest tightness, subjective wheeze or overt sinus or hb symptoms. No unusual exp hx or h/o childhood pna/ asthma or knowledge of premature birth.  Sleeping ok flat without nocturnal  or early am exacerbation  of respiratory  c/o's or need for noct saba. Also denies any obvious fluctuation of symptoms with weather or environmental changes or other aggravating or alleviating factors except as outlined above   Current Allergies, Complete Past Medical History, Past Surgical History, Family History, and Social History were reviewed in [employer] record.  ROS  The following are not active complaints unless bolded sore throat, dysphagia, dental problems, itching, sneezing,  nasal congestion or disharge of excess mucus or purulent secretions, ear ache,   fever, chills, sweats, unintended wt loss or wt gain, classically pleuritic or exertional cp,  orthopnea pnd or leg swelling, presyncope, palpitations, abdominal pain, anorexia, nausea, vomiting, diarrhea  or change in bowel habits or bladder habits, change in stools or change in urine, dysuria, hematuria,  rash, arthralgias, visual complaints, headache, numbness, weakness or ataxia or problems with walking or coordination,  change in mood/affect or memory.        Current Meds  Medication Sig  . cyclobenzaprine (FLEXERIL) 10 MG tablet Take 1 tablet by mouth 3 (three) times daily as needed.  . meloxicam (MOBIC) 15 MG tablet Take 1 tablet by mouth 2 (two) times daily.  [name]  metFORMIN (GLUCOPHAGE) 500 MG tablet Take 500 mg by mouth 2 (two) times daily with a meal.   . Norgestimate-Ethinyl Estradiol Triphasic 0.18/0.215/0.25 MG-35 MCG tablet Take 1 tablet by mouth daily.                    Objective:   Physical Exam   Obese amb wf  nad   09/01/2017        217  06/13/2017          219   05/16/17 217 lb (98.4 kg)  04/27/17 218 lb 3.2 oz (99 kg)  04/18/17 217 lb (98.4 kg)    Vital signs reviewed  - Note  on arrival 02 sats  98% on RA     HEENT: nl dentition, turbinates bilaterally, and oropharynx. Nl external ear canals without cough reflex   NECK :  without JVD/Nodes/TM/ nl carotid upstrokes bilaterally   LUNGS: no acc muscle use,  Nl contour chest which is clear to A and P bilaterally without cough on insp or exp maneuvers- no chest wall tenderness on palpation   CV:  RRR  no s3 or murmur or increase in P2, and no edema   ABD:  Obese/ mod distended / nontender with limited  inspiratory excursion in the supine position. No bruits or organomegaly appreciated, bowel sounds nl  MS:  Nl gait/ ext warm without deformities, calf tenderness, cyanosis or clubbing No obvious joint restrictions   SKIN: warm and dry without lesions    NEURO:  alert, approp, nl sensorium with  no motor or cerebellar deficits apparent.      CXR PA and Lateral:   09/01/2017 :    I personally reviewed images and agree with radiology impression as follows:   No active cardiopulmonary disease     Assessment:       Outpatient Encounter Prescriptions as of 09/01/2017  Medication Sig  . cyclobenzaprine (FLEXERIL) 10 MG tablet Take 1 tablet by mouth 3 (three) times daily as needed.  . meloxicam (MOBIC) 15 MG tablet Take 1 tablet by mouth 2 (two) times daily.  . metFORMIN (GLUCOPHAGE) 500 MG tablet Take 500 mg by mouth 2 (two) times daily with a meal.   . Norgestimate-Ethinyl Estradiol Triphasic 0.18/0.215/0.25 MG-35 MCG tablet Take 1 tablet by mouth daily.   No facility-administered encounter medications on file as of 09/01/2017.

## 2017-09-01 NOTE — Patient Instructions (Addendum)
Classic subdiaphragmatic pain pattern suggests ibs:    very limited distribution of pain locations, daytime, not usually exacerbated by exercise  or coughing, worse in sitting position, frequently associated with generalized abd bloating, not as likely to be present supine due to the dome effect of the diaphragm which  is  canceled in that position. Frequently these patients have had multiple negative GI workups and CT scans.  Treatment consists of avoiding foods that cause gas (especially boiled eggs, mexcican food but especially  beans and undercooked vegetables like  spinach and some salads)  and citrucel 1 heaping tsp twice daily with a large glass of water.  Pain should improve w/in 2 weeks and if not then consider further GI work up.      Please remember to go to the  x-ray department downstairs in the basement  for your tests - we will call you with the results when they are available.      You are cleared for surgery - pulmonary follow up is as needed

## 2017-09-01 NOTE — Assessment & Plan Note (Addendum)
Onset of R post Pleuritic Pain abrupt 03/11/17 with neg d dimer rx as pna/ pt on humira > d/c'd -04/18/17 BAL from [facility] Anterior segment and sent for bacterial, fungal and AFB stain and culture> neg rx levaquin x 15 days and pred x 6 days  - 05/16/2017 Wert f/u with improving cxr and ESR down to 18 from 30 in pt 100% clinically improved  - Venous dopplers 05/23/18 :  No evidence of deep vein thrombosis involving the right lower extremity and left lower extremity. - HRCT  07/21/17  Non dx > rec PET  08/01/2017 Resolving areas of peribronchovascular pulmonary parenchymal consolidation without abnormal hypermetabolism, indicative of an infectious or inflammatory process. Organizing pneumonia can also have this appearance. 2. 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 Gynecological consultation and subsequent ultrasound or MR pelvis recommended, as malignancy cannot be excluded > rec 08/01/2017  gyn eval next > needs oophorectomy planned for 09/15/17  - Spirometry 09/01/2017  wnl  - cxr 09/01/2017 now clear/ no f/u needed and no contraindications to pelvic surgery, [place]  I had an extended final summary discussion with the patient reviewing all relevant studies completed to date and  lasting 15 to 20 minutes of a 25 minute visit on the following issues:    Cleared for surgery with main risks related to obesity / osa and dvt/pe on bcp's reviewed  - can offset to some extent by regular exercise preop and early mobilization / min narcs post op   Each maintenance medication was reviewed in detail including most importantly the difference between maintenance and as needed and under what circumstances the prns are to be used.  Please see AVS for specific  Instructions which are unique to this visit and I personally typed out  which were reviewed in detail in writing with the patient and a copy provided.        Pulmonary f/u is prn

## 2017-09-06 NOTE — Patient Instructions (Signed)
Denise Barnett  09/06/2017   Your procedure is scheduled on: 09/15/2017    Report to [facility] Main  Entrance Take [place]  elevators to 3rd floor to  [place] at    [place] AM.    Call this number if you have problems the morning of surgery [phone]    Remember: ONLY 1 PERSON MAY GO WITH YOU TO SHORT STAY TO GET  READY MORNING OF Denise Barnett.  Do not eat food or drink liquids :After Midnight.            Eat a light diet the day before surgery.  Examples include : soups, toast, yogurt and mashed potatoes.  Things to avoid include carbonated, beverages, raw fruits and vegetables and beans.       Take these medicines the morning of surgery with A SIP OF WATER: none  DO NOT TAKE ANY DIABETIC MEDICATIONS DAY OF YOUR SURGERY                               You may not have any metal on your body including hair pins and              piercings  Do not wear jewelry, make-up, lotions, powders or perfumes, deodorant             Do not wear nail polish.  Do not shave  48 hours prior to surgery.     Do not bring valuables to the hospital. [name].  Contacts, dentures or bridgework may not be worn into surgery.  Leave suitcase in the car. After surgery it may be brought to your room.       Special Instructions:  Coughing and deep breathing exercises, leg exercises               Please read over the following fact sheets you were given: _____________________________________________________________________             [facility] - Preparing for Surgery Before surgery, you can play an important role.  Because skin is not sterile, your skin needs to be as free of germs as possible.  You can reduce the number of germs on your skin by washing with CHG (chlorahexidine gluconate) soap before surgery.  CHG is an antiseptic cleaner which kills germs and bonds with the skin to continue killing germs even after  washing. Please DO NOT use if you have an allergy to CHG or antibacterial soaps.  If your skin becomes reddened/irritated stop using the CHG and inform your nurse when you arrive at Short Stay. Do not shave (including legs and underarms) for at least 48 hours prior to the first CHG shower.  You may shave your face/neck. Please follow these instructions carefully:  1.  Shower with CHG Soap the night before surgery and the  morning of Surgery.  2.  If you choose to wash your hair, wash your hair first as usual with your  normal  shampoo.  3.  After you shampoo, rinse your hair and body thoroughly to remove the  shampoo.                           4.  Use CHG as you would any other liquid soap.  You can apply chg directly  to the skin and wash                       Gently with a scrungie or clean washcloth.  5.  Apply the CHG Soap to your body ONLY FROM THE NECK DOWN.   Do not use on face/ open                           Wound or open sores. Avoid contact with eyes, ears mouth and genitals (private parts).                       Wash face,  Genitals (private parts) with your normal soap.             6.  Wash thoroughly, paying special attention to the area where your surgery  will be performed.  7.  Thoroughly rinse your body with warm water from the neck down.  8.  DO NOT shower/wash with your normal soap after using and rinsing off  the CHG Soap.                9.  Pat yourself dry with a clean towel.            10.  Wear clean pajamas.            11.  Place clean sheets on your bed the night of your first shower and do not  sleep with pets. Day of Surgery : Do not apply any lotions/deodorants the morning of surgery.  Please wear clean clothes to the hospital/surgery center.  FAILURE TO FOLLOW THESE INSTRUCTIONS MAY RESULT IN THE CANCELLATION OF YOUR SURGERY PATIENT SIGNATURE_________________________________  NURSE  SIGNATURE__________________________________  ________________________________________________________________________  WHAT IS A BLOOD TRANSFUSION? Blood Transfusion Information  A transfusion is the replacement of blood or some of its parts. Blood is made up of multiple cells which provide different functions.  Red blood cells carry oxygen and are used for blood loss replacement.  White blood cells fight against infection.  Platelets control bleeding.  Plasma helps clot blood.  Other blood products are available for specialized needs, such as hemophilia or other clotting disorders. BEFORE THE TRANSFUSION  Who gives blood for transfusions?   Healthy volunteers who are fully evaluated to make sure their blood is safe. This is blood bank blood. Transfusion therapy is the safest it has ever been in the practice of medicine. Before blood is taken from a donor, a complete history is taken to make sure that person has no history of diseases nor engages in risky social behavior (examples are intravenous drug use or sexual activity with multiple partners). The donor's travel history is screened to minimize risk of transmitting infections, such as malaria. The donated blood is tested for signs of infectious diseases, such as HIV and hepatitis. The blood is then tested to be sure it is compatible with you in order to minimize the chance of a transfusion reaction. If you or a relative donates blood, this is often done in anticipation of surgery and is not appropriate for emergency situations. It takes many days to process the donated blood. RISKS AND COMPLICATIONS Although transfusion therapy is very safe and saves many lives, the main dangers of transfusion include:   Getting an infectious disease.  Developing a transfusion reaction. This is an  allergic reaction to something in the blood you were given. Every precaution is taken to prevent this. The decision to have a blood transfusion has been  considered carefully by your caregiver before blood is given. Blood is not given unless the benefits outweigh the risks. AFTER THE TRANSFUSION  Right after receiving a blood transfusion, you will usually feel much better and more energetic. This is especially true if your red blood cells have gotten low (anemic). The transfusion raises the level of the red blood cells which carry oxygen, and this usually causes an energy increase.  The nurse administering the transfusion will monitor you carefully for complications. HOME CARE INSTRUCTIONS  No special instructions are needed after a transfusion. You may find your energy is better. Speak with your caregiver about any limitations on activity for underlying diseases you may have. SEEK MEDICAL CARE IF:   Your condition is not improving after your transfusion.  You develop redness or irritation at the intravenous (IV) site. SEEK IMMEDIATE MEDICAL CARE IF:  Any of the following symptoms occur over the next 12 hours:  Shaking chills.  You have a temperature by mouth above 102 F (38.9 C), not controlled by medicine.  Chest, back, or muscle pain.  People around you feel you are not acting correctly or are confused.  Shortness of breath or difficulty breathing.  Dizziness and fainting.  You get a rash or develop hives.  You have a decrease in urine output.  Your urine turns a dark color or changes to pink, red, or brown. Any of the following symptoms occur over the next 10 days:  You have a temperature by mouth above 102 F (38.9 C), not controlled by medicine.  Shortness of breath.  Weakness after normal activity.  The white part of the eye turns yellow (jaundice).  You have a decrease in the amount of urine or are urinating less often.  Your urine turns a dark color or changes to pink, red, or brown. Document Released: 11/26/2000 Document Revised: 02/21/2012 Document Reviewed: 07/15/2008 ExitCare Patient Information 2014  [place].  _______________________________________________________________________  Incentive Spirometer  An incentive spirometer is a tool that can help keep your lungs clear and active. This tool measures how well you are filling your lungs with each breath. Taking long deep breaths may help reverse or decrease the chance of developing breathing (pulmonary) problems (especially infection) following:  A long period of time when you are unable to move or be active. BEFORE THE PROCEDURE   If the spirometer includes an indicator to show your best effort, your nurse or respiratory therapist will set it to a desired goal.  If possible, sit up straight or lean slightly forward. Try not to slouch.  Hold the incentive spirometer in an upright position. INSTRUCTIONS FOR USE  1. Sit on the edge of your bed if possible, or sit up as far as you can in bed or on a chair. 2. Hold the incentive spirometer in an upright position. 3. Breathe out normally. 4. Place the mouthpiece in your mouth and seal your lips tightly around it. 5. Breathe in slowly and as deeply as possible, raising the piston or the ball toward the top of the column. 6. Hold your breath for 3-5 seconds or for as long as possible. Allow the piston or ball to fall to the bottom of the column. 7. Remove the mouthpiece from your mouth and breathe out normally. 8. Rest for a few seconds and repeat Steps 1 through 7  at least 10 times every 1-2 hours when you are awake. Take your time and take a few normal breaths between deep breaths. 9. The spirometer may include an indicator to show your best effort. Use the indicator as a goal to work toward during each repetition. 10. After each set of 10 deep breaths, practice coughing to be sure your lungs are clear. If you have an incision (the cut made at the time of surgery), support your incision when coughing by placing a pillow or rolled up towels firmly against it. Once you are able to get  out of bed, walk around indoors and cough well. You may stop using the incentive spirometer when instructed by your caregiver.  RISKS AND COMPLICATIONS  Take your time so you do not get dizzy or light-headed.  If you are in pain, you may need to take or ask for pain medication before doing incentive spirometry. It is harder to take a deep breath if you are having pain. AFTER USE  Rest and breathe slowly and easily.  It can be helpful to keep track of a log of your progress. Your caregiver can provide you with a simple table to help with this. If you are using the spirometer at home, follow these instructions: [place] IF:   You are having difficultly using the spirometer.  You have trouble using the spirometer as often as instructed.  Your pain medication is not giving enough relief while using the spirometer.  You develop fever of 100.5 F (38.1 C) or higher. SEEK IMMEDIATE MEDICAL CARE IF:   You cough up bloody sputum that had not been present before.  You develop fever of 102 F (38.9 C) or greater.  You develop worsening pain at or near the incision site. MAKE SURE YOU:   Understand these instructions.  Will watch your condition.  Will get help right away if you are not doing well or get worse. Document Released: 04/11/2007 Document Revised: 02/21/2012 Document Reviewed: 06/12/2007 [facility] Patient Information 2014 [place], [place].   ________________________________________________________________________

## 2017-09-12 ENCOUNTER — Encounter (HOSPITAL_COMMUNITY): Payer: Self-pay

## 2017-09-12 ENCOUNTER — Encounter (HOSPITAL_COMMUNITY)
Admission: RE | Admit: 2017-09-12 | Discharge: 2017-09-12 | Disposition: A | Discharge status: Home / Self Care | Payer: Managed Care, Other (non HMO) | Source: Ambulatory Visit | Attending: Gynecologic Oncology | Admitting: Gynecologic Oncology

## 2017-09-12 DIAGNOSIS — Z801 Family history of malignant neoplasm of trachea, bronchus and lung: Secondary | ICD-10-CM | POA: Diagnosis not present

## 2017-09-12 DIAGNOSIS — G4730 Sleep apnea, unspecified: Secondary | ICD-10-CM | POA: Diagnosis not present

## 2017-09-12 DIAGNOSIS — Z793 Long term (current) use of hormonal contraceptives: Secondary | ICD-10-CM | POA: Diagnosis not present

## 2017-09-12 DIAGNOSIS — G4733 Obstructive sleep apnea (adult) (pediatric): Secondary | ICD-10-CM | POA: Diagnosis not present

## 2017-09-12 DIAGNOSIS — Z79899 Other long term (current) drug therapy: Secondary | ICD-10-CM | POA: Diagnosis not present

## 2017-09-12 DIAGNOSIS — Z7984 Long term (current) use of oral hypoglycemic drugs: Secondary | ICD-10-CM | POA: Diagnosis not present

## 2017-09-12 DIAGNOSIS — Z791 Long term (current) use of non-steroidal anti-inflammatories (NSAID): Secondary | ICD-10-CM | POA: Diagnosis not present

## 2017-09-12 DIAGNOSIS — D3912 Neoplasm of uncertain behavior of left ovary: Secondary | ICD-10-CM | POA: Diagnosis not present

## 2017-09-12 DIAGNOSIS — Z808 Family history of malignant neoplasm of other organs or systems: Secondary | ICD-10-CM | POA: Diagnosis not present

## 2017-09-12 DIAGNOSIS — E119 Type 2 diabetes mellitus without complications: Secondary | ICD-10-CM | POA: Diagnosis not present

## 2017-09-12 DIAGNOSIS — N83202 Unspecified ovarian cyst, left side: Secondary | ICD-10-CM | POA: Diagnosis present

## 2017-09-12 DIAGNOSIS — R1011 Right upper quadrant pain: Secondary | ICD-10-CM | POA: Diagnosis not present

## 2017-09-12 DIAGNOSIS — Z87891 Personal history of nicotine dependence: Secondary | ICD-10-CM | POA: Diagnosis not present

## 2017-09-12 DIAGNOSIS — Z6839 Body mass index (BMI) 39.0-39.9, adult: Secondary | ICD-10-CM | POA: Diagnosis not present

## 2017-09-12 HISTORY — DX: Calcaneal spur, left foot: M77.32

## 2017-09-12 LAB — COMPREHENSIVE METABOLIC PANEL
ALBUMIN: 3.7 g/dL (ref 3.5–5.0)
ALK PHOS: 78 U/L (ref 38–126)
ALT: 15 U/L (ref 14–54)
ANION GAP: 8 (ref 5–15)
AST: 20 U/L (ref 15–41)
BILIRUBIN TOTAL: 0.4 mg/dL (ref 0.3–1.2)
BUN: 12 mg/dL (ref 6–20)
CALCIUM: 8.9 mg/dL (ref 8.9–10.3)
CO2: 24 mmol/L (ref 22–32)
CREATININE: 0.61 mg/dL (ref 0.44–1.00)
Chloride: 104 mmol/L (ref 101–111)
GFR calc Af Amer: >60 mL/min (ref >60)
GFR calc non Af Amer: >60 mL/min (ref >60)
GLUCOSE: 97 mg/dL (ref 65–99)
Potassium: 3.9 mmol/L (ref 3.5–5.1)
Sodium: 136 mmol/L (ref 135–145)
TOTAL PROTEIN: 7.5 g/dL (ref 6.5–8.1)

## 2017-09-12 LAB — CBC
HEMATOCRIT: 33.8 % — AB (ref 36.0–46.0)
HEMOGLOBIN: 11.7 g/dL — AB (ref 12.0–15.0)
MCH: 27 pg (ref 26.0–34.0)
MCHC: 34.6 g/dL (ref 30.0–36.0)
MCV: 78.1 fL (ref 78.0–100.0)
Platelets: 272 10*3/uL (ref 150–400)
RBC: 4.33 MIL/uL (ref 3.87–5.11)
RDW: 13.3 % (ref 11.5–15.5)
WBC: 10.6 10*3/uL — ABNORMAL HIGH (ref 4.0–10.5)

## 2017-09-12 LAB — URINALYSIS, ROUTINE W REFLEX MICROSCOPIC
BACTERIA UA: NONE SEEN
BILIRUBIN URINE: NEGATIVE
Glucose, UA: NEGATIVE mg/dL
KETONES UR: NEGATIVE mg/dL
LEUKOCYTES UA: NEGATIVE
NITRITE: NEGATIVE
Protein, ur: NEGATIVE mg/dL
Specific Gravity, Urine: 1.006 (ref 1.005–1.030)
pH: 5 (ref 5.0–8.0)

## 2017-09-12 LAB — GLUCOSE, CAPILLARY: Glucose-Capillary: 96 mg/dL (ref 65–99)

## 2017-09-12 LAB — PREGNANCY, URINE: Preg Test, Ur: NEGATIVE

## 2017-09-12 LAB — HEMOGLOBIN A1C
Hgb A1c MFr Bld: 5.6 % (ref 4.8–5.6)
Mean Plasma Glucose: 114.02 mg/dL

## 2017-09-12 LAB — ABO/RH: ABO/RH(D): O NEG

## 2017-09-12 NOTE — Progress Notes (Signed)
CT chest- 07/2017-epic  2V CXr- 09/01/17-epic ECHO-04/16/17-epic  LOV- pulm- 09/01/17-epic

## 2017-09-12 NOTE — Progress Notes (Signed)
U/A done 09/12/17 faxed via epic to Dr Denman George and Zoila Shutter.

## 2017-09-13 NOTE — Progress Notes (Signed)
Final EKG done 09/12/17-epic.  

## 2017-09-14 NOTE — Anesthesia Preprocedure Evaluation (Addendum)
Anesthesia Evaluation  Patient identified by MRN, date of birth, ID band Patient awake    Reviewed: Allergy & Precautions, NPO status , Patient's Chart, lab work & pertinent test results  Airway Mallampati: III  TM Distance: >3 FB Neck ROM: Full   Comment: SMALL MOUTH ! Dental no notable dental hx.    Pulmonary neg pulmonary ROS, sleep apnea and Continuous Positive Airway Pressure Ventilation , former smoker,    Pulmonary exam normal breath sounds clear to auscultation       Cardiovascular negative cardio ROS Normal cardiovascular exam Rhythm:Regular Rate:Normal     Neuro/Psych negative neurological ROS  negative psych ROS   GI/Hepatic negative GI ROS, Neg liver ROS,   Endo/Other  negative endocrine ROSdiabetes, Type 1Morbid obesity  Renal/GU negative Renal ROS  negative genitourinary   Musculoskeletal negative musculoskeletal ROS (+)   Abdominal   Peds negative pediatric ROS (+)  Hematology negative hematology ROS (+)   Anesthesia Other Findings Echo 5/18  udy Conclusions  - Left ventricle: The cavity size was normal. Wall thickness was   normal. Systolic function was normal. The estimated ejection   fraction was in the range of 60% to 65%. Wall motion was normal;   there were no regional wall motion abnormalities. Left   ventricular diastolic function parameters were normal. - Aortic valve: Valve area (VTI): 2.09 cm^2. Valve area (Vmax):   2.19 cm^2. Valve area (Vmean): 1.95 cm^2. - Mitral valve: There was mild regurgitation.  Reproductive/Obstetrics negative OB ROS                            Anesthesia Physical Anesthesia Plan  ASA: III  Anesthesia Plan: General   Post-op Pain Management:    Induction: Intravenous  PONV Risk Score and Plan: 2 and Ondansetron, Dexamethasone, Treatment may vary due to age or medical condition, Midazolam and Scopolamine patch - Pre-op  Airway  Management Planned: Oral ETT  Additional Equipment:   Intra-op Plan:   Post-operative Plan: Extubation in OR  Informed Consent:   Plan Discussed with:   Anesthesia Plan Comments: (  )        Anesthesia Quick Evaluation

## 2017-09-15 ENCOUNTER — Ambulatory Visit (HOSPITAL_COMMUNITY)
Admission: RE | Admit: 2017-09-15 | Discharge: 2017-09-16 | Disposition: A | Discharge status: Home / Self Care | Payer: Managed Care, Other (non HMO) | Source: Ambulatory Visit | Attending: Gynecologic Oncology | Admitting: Gynecologic Oncology

## 2017-09-15 ENCOUNTER — Encounter (HOSPITAL_COMMUNITY)
Admission: RE | Disposition: A | Discharge status: Home / Self Care | Payer: Self-pay | Source: Ambulatory Visit | Attending: Gynecologic Oncology

## 2017-09-15 ENCOUNTER — Ambulatory Visit (HOSPITAL_COMMUNITY): Payer: Managed Care, Other (non HMO) | Admitting: Anesthesiology

## 2017-09-15 ENCOUNTER — Telehealth: Payer: Self-pay | Admitting: *Deleted

## 2017-09-15 ENCOUNTER — Encounter (HOSPITAL_COMMUNITY): Payer: Self-pay | Admitting: Anesthesiology

## 2017-09-15 DIAGNOSIS — Z791 Long term (current) use of non-steroidal anti-inflammatories (NSAID): Secondary | ICD-10-CM | POA: Insufficient documentation

## 2017-09-15 DIAGNOSIS — Z808 Family history of malignant neoplasm of other organs or systems: Secondary | ICD-10-CM | POA: Insufficient documentation

## 2017-09-15 DIAGNOSIS — Z6839 Body mass index (BMI) 39.0-39.9, adult: Secondary | ICD-10-CM | POA: Insufficient documentation

## 2017-09-15 DIAGNOSIS — Z79899 Other long term (current) drug therapy: Secondary | ICD-10-CM | POA: Insufficient documentation

## 2017-09-15 DIAGNOSIS — R1900 Intra-abdominal and pelvic swelling, mass and lump, unspecified site: Secondary | ICD-10-CM

## 2017-09-15 DIAGNOSIS — Z7984 Long term (current) use of oral hypoglycemic drugs: Secondary | ICD-10-CM | POA: Insufficient documentation

## 2017-09-15 DIAGNOSIS — R109 Unspecified abdominal pain: Secondary | ICD-10-CM | POA: Diagnosis present

## 2017-09-15 DIAGNOSIS — G4733 Obstructive sleep apnea (adult) (pediatric): Secondary | ICD-10-CM | POA: Insufficient documentation

## 2017-09-15 DIAGNOSIS — Z87891 Personal history of nicotine dependence: Secondary | ICD-10-CM | POA: Insufficient documentation

## 2017-09-15 DIAGNOSIS — R1011 Right upper quadrant pain: Secondary | ICD-10-CM | POA: Insufficient documentation

## 2017-09-15 DIAGNOSIS — N83202 Unspecified ovarian cyst, left side: Secondary | ICD-10-CM

## 2017-09-15 DIAGNOSIS — D3912 Neoplasm of uncertain behavior of left ovary: Secondary | ICD-10-CM | POA: Diagnosis not present

## 2017-09-15 DIAGNOSIS — E119 Type 2 diabetes mellitus without complications: Secondary | ICD-10-CM | POA: Insufficient documentation

## 2017-09-15 DIAGNOSIS — Z793 Long term (current) use of hormonal contraceptives: Secondary | ICD-10-CM | POA: Insufficient documentation

## 2017-09-15 DIAGNOSIS — Z801 Family history of malignant neoplasm of trachea, bronchus and lung: Secondary | ICD-10-CM | POA: Insufficient documentation

## 2017-09-15 DIAGNOSIS — G4730 Sleep apnea, unspecified: Secondary | ICD-10-CM | POA: Insufficient documentation

## 2017-09-15 HISTORY — PX: BILATERAL SALPINGECTOMY: SHX5743

## 2017-09-15 HISTORY — PX: LAPAROTOMY: SHX154

## 2017-09-15 HISTORY — PX: OOPHORECTOMY: SHX6387

## 2017-09-15 LAB — GLUCOSE, CAPILLARY
GLUCOSE-CAPILLARY: 151 mg/dL — AB (ref 65–99)
GLUCOSE-CAPILLARY: 128 mg/dL — AB (ref 65–99)
Glucose-Capillary: 110 mg/dL — ABNORMAL HIGH (ref 65–99)
Glucose-Capillary: 190 mg/dL — ABNORMAL HIGH (ref 65–99)
Glucose-Capillary: 187 mg/dL — ABNORMAL HIGH (ref 65–99)

## 2017-09-15 LAB — TYPE AND SCREEN
ABO/RH(D): O NEG
Antibody Screen: NEGATIVE

## 2017-09-15 SURGERY — LAPAROTOMY, EXPLORATORY
Anesthesia: General | Laterality: Left

## 2017-09-15 MED ORDER — ROCURONIUM BROMIDE 50 MG/5ML IV SOSY
PREFILLED_SYRINGE | INTRAVENOUS | Status: AC
  Filled 2017-09-15: qty 5

## 2017-09-15 MED ORDER — SUFENTANIL CITRATE 50 MCG/ML IV SOLN
INTRAVENOUS | Status: AC
  Filled 2017-09-15: qty 1

## 2017-09-15 MED ORDER — SODIUM CHLORIDE 0.9 % IJ SOLN
INTRAMUSCULAR | Status: AC
  Filled 2017-09-15: qty 10

## 2017-09-15 MED ORDER — MIDAZOLAM HCL 2 MG/2ML IJ SOLN
INTRAMUSCULAR | Status: AC
  Filled 2017-09-15: qty 2

## 2017-09-15 MED ORDER — CIPROFLOXACIN IN D5W 400 MG/200ML IV SOLN
400.0000 mg | INTRAVENOUS | Status: AC
  Administered 2017-09-15: 400 mg via INTRAVENOUS

## 2017-09-15 MED ORDER — BUPIVACAINE LIPOSOME 1.3 % IJ SUSP
20.0000 mL | Freq: Once | INTRAMUSCULAR | Status: AC
  Administered 2017-09-15: 20 mL
  Filled 2017-09-15: qty 20

## 2017-09-15 MED ORDER — OXYCODONE-ACETAMINOPHEN 5-325 MG PO TABS
1.0000 | ORAL_TABLET | ORAL | Status: DC | PRN
  Administered 2017-09-15 – 2017-09-16 (×5): 2 via ORAL
  Filled 2017-09-15 (×5): qty 2

## 2017-09-15 MED ORDER — ONDANSETRON HCL 4 MG/2ML IJ SOLN
4.0000 mg | Freq: Four times a day (QID) | INTRAMUSCULAR | Status: DC | PRN
  Administered 2017-09-15: 4 mg via INTRAVENOUS
  Filled 2017-09-15: qty 2

## 2017-09-15 MED ORDER — ONDANSETRON HCL 4 MG/2ML IJ SOLN
INTRAMUSCULAR | Status: DC | PRN
  Administered 2017-09-15: 4 mg via INTRAVENOUS

## 2017-09-15 MED ORDER — KETAMINE HCL-SODIUM CHLORIDE 100-0.9 MG/10ML-% IV SOSY
PREFILLED_SYRINGE | INTRAVENOUS | Status: AC
  Filled 2017-09-15: qty 10

## 2017-09-15 MED ORDER — MIDAZOLAM HCL 2 MG/2ML IJ SOLN
INTRAMUSCULAR | Status: DC | PRN
  Administered 2017-09-15: 2 mg via INTRAVENOUS

## 2017-09-15 MED ORDER — PROPOFOL 10 MG/ML IV BOLUS
INTRAVENOUS | Status: AC
  Filled 2017-09-15: qty 20

## 2017-09-15 MED ORDER — INSULIN GLARGINE 100 UNIT/ML ~~LOC~~ SOLN
15.0000 [IU] | Freq: Every day | SUBCUTANEOUS | Status: DC
  Administered 2017-09-15: 15 [IU] via SUBCUTANEOUS
  Filled 2017-09-15 (×2): qty 0.15

## 2017-09-15 MED ORDER — CLINDAMYCIN PHOSPHATE 900 MG/50ML IV SOLN
900.0000 mg | INTRAVENOUS | Status: AC
  Administered 2017-09-15: 900 mg via INTRAVENOUS

## 2017-09-15 MED ORDER — HYDROMORPHONE HCL 1 MG/ML IJ SOLN
0.2000 mg | INTRAMUSCULAR | Status: DC | PRN
  Administered 2017-09-15: 0.5 mg via INTRAVENOUS
  Filled 2017-09-15: qty 1

## 2017-09-15 MED ORDER — SUCCINYLCHOLINE CHLORIDE 200 MG/10ML IV SOSY
PREFILLED_SYRINGE | INTRAVENOUS | Status: AC
  Filled 2017-09-15: qty 10

## 2017-09-15 MED ORDER — ENOXAPARIN SODIUM 40 MG/0.4ML ~~LOC~~ SOLN
40.0000 mg | SUBCUTANEOUS | Status: AC
  Administered 2017-09-15: 40 mg via SUBCUTANEOUS
  Filled 2017-09-15: qty 0.4

## 2017-09-15 MED ORDER — SODIUM CHLORIDE 0.9 % IJ SOLN
INTRAMUSCULAR | Status: AC
  Filled 2017-09-15: qty 40

## 2017-09-15 MED ORDER — ROCURONIUM BROMIDE 10 MG/ML (PF) SYRINGE
PREFILLED_SYRINGE | INTRAVENOUS | Status: DC | PRN
  Administered 2017-09-15: 50 mg via INTRAVENOUS

## 2017-09-15 MED ORDER — ONDANSETRON HCL 4 MG/2ML IJ SOLN
INTRAMUSCULAR | Status: AC
  Filled 2017-09-15: qty 2

## 2017-09-15 MED ORDER — DEXAMETHASONE SODIUM PHOSPHATE 10 MG/ML IJ SOLN
INTRAMUSCULAR | Status: AC
  Filled 2017-09-15: qty 1

## 2017-09-15 MED ORDER — FENTANYL CITRATE (PF) 100 MCG/2ML IJ SOLN
INTRAMUSCULAR | Status: AC
  Administered 2017-09-15: 50 ug via INTRAVENOUS
  Filled 2017-09-15: qty 2

## 2017-09-15 MED ORDER — INSULIN ASPART 100 UNIT/ML ~~LOC~~ SOLN
0.0000 [IU] | Freq: Three times a day (TID) | SUBCUTANEOUS | Status: DC
  Administered 2017-09-15: 2 [IU] via SUBCUTANEOUS
  Administered 2017-09-15: 3 [IU] via SUBCUTANEOUS
  Administered 2017-09-16: 5 [IU] via SUBCUTANEOUS

## 2017-09-15 MED ORDER — CLINDAMYCIN PHOSPHATE 900 MG/50ML IV SOLN
INTRAVENOUS | Status: AC
  Filled 2017-09-15: qty 50

## 2017-09-15 MED ORDER — KCL IN DEXTROSE-NACL 20-5-0.45 MEQ/L-%-% IV SOLN
INTRAVENOUS | Status: DC
  Administered 2017-09-15 (×2): via INTRAVENOUS
  Filled 2017-09-15 (×3): qty 1000

## 2017-09-15 MED ORDER — KETAMINE HCL 10 MG/ML IJ SOLN
INTRAMUSCULAR | Status: DC | PRN
  Administered 2017-09-15: 20 mg via INTRAVENOUS
  Administered 2017-09-15: 30 mg via INTRAVENOUS

## 2017-09-15 MED ORDER — BUPIVACAINE HCL 0.25 % IJ SOLN
INTRAMUSCULAR | Status: DC | PRN
Start: 2017-09-15 — End: 2017-09-15
  Administered 2017-09-15: 20 mL

## 2017-09-15 MED ORDER — ONDANSETRON HCL 4 MG PO TABS: 4.0000 mg | ORAL_TABLET | Freq: Four times a day (QID) | ORAL | Status: DC | PRN

## 2017-09-15 MED ORDER — 0.9 % SODIUM CHLORIDE (POUR BTL) OPTIME
TOPICAL | Status: DC | PRN
  Administered 2017-09-15: 2000 mL

## 2017-09-15 MED ORDER — LIDOCAINE 2% (20 MG/ML) 5 ML SYRINGE
INTRAMUSCULAR | Status: AC
  Filled 2017-09-15: qty 5

## 2017-09-15 MED ORDER — SUGAMMADEX SODIUM 200 MG/2ML IV SOLN
INTRAVENOUS | Status: AC
  Filled 2017-09-15: qty 2

## 2017-09-15 MED ORDER — GABAPENTIN 300 MG PO CAPS
600.0000 mg | ORAL_CAPSULE | Freq: Every day | ORAL | Status: AC
  Administered 2017-09-15: 600 mg via ORAL
  Filled 2017-09-15: qty 2

## 2017-09-15 MED ORDER — CIPROFLOXACIN IN D5W 400 MG/200ML IV SOLN
INTRAVENOUS | Status: AC
  Filled 2017-09-15: qty 200

## 2017-09-15 MED ORDER — ONDANSETRON HCL 4 MG/2ML IJ SOLN: 4.0000 mg | Freq: Once | INTRAMUSCULAR | Status: DC | PRN

## 2017-09-15 MED ORDER — PROPOFOL 10 MG/ML IV BOLUS
INTRAVENOUS | Status: DC | PRN
  Administered 2017-09-15: 160 mg via INTRAVENOUS

## 2017-09-15 MED ORDER — LACTATED RINGERS IV SOLN
INTRAVENOUS | Status: DC | PRN
  Administered 2017-09-15 (×2): via INTRAVENOUS

## 2017-09-15 MED ORDER — MEPERIDINE HCL 50 MG/ML IJ SOLN: 6.2500 mg | INTRAMUSCULAR | Status: DC | PRN

## 2017-09-15 MED ORDER — DICLOFENAC SODIUM 50 MG PO TBEC
50.0000 mg | DELAYED_RELEASE_TABLET | Freq: Four times a day (QID) | ORAL | Status: DC
  Administered 2017-09-15 – 2017-09-16 (×4): 50 mg via ORAL
  Filled 2017-09-15 (×6): qty 1

## 2017-09-15 MED ORDER — SENNOSIDES-DOCUSATE SODIUM 8.6-50 MG PO TABS
2.0000 | ORAL_TABLET | Freq: Every day | ORAL | Status: DC
  Administered 2017-09-15: 2 via ORAL
  Filled 2017-09-15: qty 2

## 2017-09-15 MED ORDER — SUFENTANIL CITRATE 50 MCG/ML IV SOLN
INTRAVENOUS | Status: DC | PRN
  Administered 2017-09-15 (×2): 10 ug via INTRAVENOUS
  Administered 2017-09-15: 20 ug via INTRAVENOUS

## 2017-09-15 MED ORDER — LIDOCAINE 2% (20 MG/ML) 5 ML SYRINGE
INTRAMUSCULAR | Status: DC | PRN
  Administered 2017-09-15: 100 mg via INTRAVENOUS

## 2017-09-15 MED ORDER — SCOPOLAMINE 1 MG/3DAYS TD PT72
MEDICATED_PATCH | TRANSDERMAL | Status: AC
  Filled 2017-09-15: qty 1

## 2017-09-15 MED ORDER — SUGAMMADEX SODIUM 200 MG/2ML IV SOLN
INTRAVENOUS | Status: DC | PRN
  Administered 2017-09-15: 200 mg via INTRAVENOUS

## 2017-09-15 MED ORDER — SCOPOLAMINE 1 MG/3DAYS TD PT72
MEDICATED_PATCH | TRANSDERMAL | Status: DC | PRN
  Administered 2017-09-15: 1 via TRANSDERMAL

## 2017-09-15 MED ORDER — FENTANYL CITRATE (PF) 100 MCG/2ML IJ SOLN
25.0000 ug | INTRAMUSCULAR | Status: DC | PRN
  Administered 2017-09-15 (×2): 50 ug via INTRAVENOUS

## 2017-09-15 MED ORDER — DEXAMETHASONE SODIUM PHOSPHATE 10 MG/ML IJ SOLN
INTRAMUSCULAR | Status: DC | PRN
  Administered 2017-09-15: 10 mg via INTRAVENOUS

## 2017-09-15 MED ORDER — BUPIVACAINE HCL (PF) 0.25 % IJ SOLN
INTRAMUSCULAR | Status: AC
  Filled 2017-09-15: qty 30

## 2017-09-15 SURGICAL SUPPLY — 58 items
ADH SKN CLS APL DERMABOND .7 (GAUZE/BANDAGES/DRESSINGS) ×2
AGENT HMST MTR 8 SURGIFLO (HEMOSTASIS)
ATTRACTOMAT 16X20 MAGNETIC DRP (DRAPES) ×1 IMPLANT
BLADE EXTENDED COATED 6.5IN (ELECTRODE) ×3 IMPLANT
CELLS DAT CNTRL 66122 CELL SVR (MISCELLANEOUS) ×2 IMPLANT
CHLORAPREP W/TINT 26ML (MISCELLANEOUS) ×3 IMPLANT
CLIP VESOCCLUDE LG 6/CT (CLIP) ×2 IMPLANT
CLIP VESOCCLUDE MED 6/CT (CLIP) ×2 IMPLANT
CLIP VESOCCLUDE MED LG 6/CT (CLIP) ×2 IMPLANT
CONT SPEC 4OZ CLIKSEAL STRL BL (MISCELLANEOUS) ×1 IMPLANT
DERMABOND ADVANCED (GAUZE/BANDAGES/DRESSINGS) ×1
DERMABOND ADVANCED .7 DNX12 (GAUZE/BANDAGES/DRESSINGS) IMPLANT
DRAPE INCISE IOBAN 66X45 STRL (DRAPES) IMPLANT
DRAPE WARM FLUID 44X44 (DRAPE) ×3 IMPLANT
DRSG OPSITE POSTOP 4X6 (GAUZE/BANDAGES/DRESSINGS) ×1 IMPLANT
ELECT REM PT RETURN 15FT ADLT (MISCELLANEOUS) ×3 IMPLANT
GAUZE SPONGE 4X4 16PLY XRAY LF (GAUZE/BANDAGES/DRESSINGS) ×1 IMPLANT
GLOVE BIO SURGEON STRL SZ 6 (GLOVE) ×6 IMPLANT
GLOVE BIO SURGEON STRL SZ 6.5 (GLOVE) ×6 IMPLANT
GOWN STRL REUS W/ TWL LRG LVL3 (GOWN DISPOSABLE) ×4 IMPLANT
GOWN STRL REUS W/TWL LRG LVL3 (GOWN DISPOSABLE) ×6
HEMOSTAT ARISTA ABSORB 3G PWDR (MISCELLANEOUS) IMPLANT
KIT BASIN OR (CUSTOM PROCEDURE TRAY) ×3 IMPLANT
LIGASURE IMPACT 36 18CM CVD LR (INSTRUMENTS) IMPLANT
LOOP VESSEL MAXI BLUE (MISCELLANEOUS) IMPLANT
NEEDLE HYPO 22GX1.5 SAFETY (NEEDLE) ×6 IMPLANT
NS IRRIG 1000ML POUR BTL (IV SOLUTION) ×4 IMPLANT
PACK GENERAL/GYN (CUSTOM PROCEDURE TRAY) ×3 IMPLANT
RELOAD PROXIMATE 75MM BLUE (ENDOMECHANICALS) IMPLANT
RELOAD PROXIMATE TA60MM BLUE (ENDOMECHANICALS) IMPLANT
RELOAD STAPLE 60 BLU REG PROX (ENDOMECHANICALS) IMPLANT
RELOAD STAPLE 75 3.8 BLU REG (ENDOMECHANICALS) IMPLANT
RETRACTOR WND ALEXIS 18 MED (MISCELLANEOUS) IMPLANT
RETRACTOR WND ALEXIS 25 LRG (MISCELLANEOUS) IMPLANT
RTRCTR WOUND ALEXIS 18CM MED (MISCELLANEOUS) ×3
RTRCTR WOUND ALEXIS 25CM LRG (MISCELLANEOUS)
SHEET LAVH (DRAPES) ×3 IMPLANT
SPOGE SURGIFLO 8M (HEMOSTASIS)
SPONGE LAP 18X18 X RAY DECT (DISPOSABLE) IMPLANT
SPONGE SURGIFLO 8M (HEMOSTASIS) IMPLANT
STAPLER GUN LINEAR PROX 60 (STAPLE) IMPLANT
STAPLER PROXIMATE 75MM BLUE (STAPLE) IMPLANT
STAPLER VISISTAT 35W (STAPLE) IMPLANT
SUT MNCRL AB 4-0 PS2 18 (SUTURE) ×1 IMPLANT
SUT PDS AB 1 TP1 96 (SUTURE) ×6 IMPLANT
SUT SILK 3 0 SH CR/8 (SUTURE) ×2 IMPLANT
SUT VIC AB 0 CT1 36 (SUTURE) ×8 IMPLANT
SUT VIC AB 2-0 CT1 36 (SUTURE) ×4 IMPLANT
SUT VIC AB 2-0 CT2 27 (SUTURE) ×20 IMPLANT
SUT VIC AB 3-0 CTX 36 (SUTURE) IMPLANT
SUT VIC AB 3-0 SH 18 (SUTURE) IMPLANT
SUT VIC AB 3-0 SH 27 (SUTURE)
SUT VIC AB 3-0 SH 27X BRD (SUTURE) ×2 IMPLANT
SYR 30ML LL (SYRINGE) ×6 IMPLANT
TOWEL OR 17X26 10 PK STRL BLUE (TOWEL DISPOSABLE) ×3 IMPLANT
TOWEL OR NON WOVEN STRL DISP B (DISPOSABLE) ×3 IMPLANT
TRAY FOLEY W/METER SILVER 16FR (SET/KITS/TRAYS/PACK) ×3 IMPLANT
UNDERPAD 30X30 (UNDERPADS AND DIAPERS) ×3 IMPLANT

## 2017-09-15 NOTE — Interval H&P Note (Signed)
History and Physical Interval Note:  09/15/2017 7:13 AM  Denise Barnett  has presented today for surgery, with the diagnosis of PELVIC MASS  The various methods of treatment have been discussed with the patient and family. After consideration of risks, benefits and other options for treatment, the patient has consented to  Procedure(s): EXPLORATORY LAPAROTOMY (Bilateral) UNILATERAL OOPHORECTOMY (Bilateral) BILATERAL SALPINGECTOMY (Bilateral) POSSIBLE LAPAROTOMY WITH STAGING (Bilateral) as a surgical intervention .  The patient's history has been reviewed, patient examined, no change in status, stable for surgery.  I have reviewed the patient's chart and labs.  Questions were answered to the patient's satisfaction.     [name]

## 2017-09-15 NOTE — H&P (View-Only) (Signed)
Consult Note: Gyn-Onc  Denise Barnett 46 y.o. female  CC:  Chief Complaint  Patient presents with  . Pelvic mass    XAJ:OINOMVE is seen today in consultation at the request of Dr. Jerelyn Charles. Consulting physician Dr. Melvyn Novas  Patient is a very pleasant 46 year old gravida 1 para 1 whose next cycle is due soon. Her cycles are fairly regular as she is on birth control pills but she's had a longer cycle last month which lasted 7 days. She has had some intermittent abdominal pain in the right upper quadrant for the past few months. It was hard for her to know that was related to her lungs are other issues. She began having issues with her lungs and was evaluated by Dr. Melvyn Novas as she had right back pain that was worse with deep inspiration. Patient was seen on 03/11/2017 with right lateral chest pain at that time chest x-ray showed small postinfectious pneumatocyst, patient was discharged on Levaquin. She had a follow-up chest x-ray and  CT chest done which showed progression of the opacities visualized on 03/11/2017. She was subsequently admitted and have really been on and off antibiotics as well as steroids and inhalers. She has a history of sleep apnea. Ultimately she had a repeat CT of the chest done, results of which are below.  CT chest 8/18: IMPRESSION: 1. Waxing and waning pattern of multifocal mass-like and nodular areas of apparent airspace consolidation throughout the lungs bilaterally (right greater than left), as detailed above. Overall, this process appears slightly progressive compared to the prior examination. There is associated lymphadenopathy in the mediastinum and upper abdomen. This appearance is highly unusual, and carries a broad differential diagnosis which includes atypical infection, pulmonary vasculitides, and neoplasm such as lymphoma (e.g., MALToma). Tissue sampling of one of these lesions is suggested to establish a tissue diagnosis, if clinically appropriate. 2. Aortic  atherosclerosis, in addition to left anterior descending coronary artery disease. Please note that although the presence of coronary artery calcium documents the presence of coronary arterym disease, the severity of this disease and any potential stenosis cannot be assessed on this non-gated CT examination. Assessment for potential risk factor modification, dietary therapy or pharmacologic therapy may be warranted, if clinically indicated. 3. Hepatic steatosis. CHEST: No hypermetabolic mediastinal, hilar or axillary lymph nodes. There are resolving areas of peribronchovascular consolidation in the lungs bilaterally, improved from 07/21/2017 and without abnormal hypermetabolism above blood pool. Heart is at the upper limits of normal in size to mildly enlarged. No pericardial or pleural effusion.  As a result of the lesions on her chest CT, she subsequently underwent a PET/CT.  PET CT 8/18 ABDOMEN/PELVIS: No abnormal hypermetabolism in the liver, adrenal glands, spleen or pancreas. No hypermetabolic lymph nodes. Liver, gallbladder, adrenal glands, kidneys, spleen, pancreas, stomach and bowel are grossly unremarkable. A 16.9 x 21.7 cm cystic mass is seen in the central anatomic pelvis with soft tissue along the inferior left posterolateral margin (CT image 173). No associated abnormal hypermetabolism. Lesion likely arises from the left ovary. Small to borderline enlarged mesenteric lymph nodes and mesenteric haziness, again, without abnormal hypermetabolism.  SKELETON: No abnormal osseous hypermetabolism.  IMPRESSION: 1. Resolving areas of peribronchovascular pulmonary parenchymal consolidation without abnormal hypermetabolism, indicative of an infectious or inflammatory process. Organizing pneumonia can alsohave this appearance. 2. 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   She was seen by Dr. Carlis Abbott in follow-up of the mass noted on CT  scan. She underwent an ultrasound on September 7 revealed a cystic mass measuring 19.4 x 15.9 x 20.2 cm with internal echoes. There is no flow. She had a CA-125 of 22.2 and a CEA of 0.8. Her hemoglobin A1c was 6.1. She does have up-to-date Pap smears as well as mammograms. Her mammogram was in May 2018 and was unremarkable. In retrospect she does note that she has had some early satiety for about the past month but has not lost any weight. She had one episode of emesis and she thought that it was related to eating too much. She has always had fairly loose-type stools but more recently she's had increased stools to up to 5 per day. She's not sure of this related to her metformin or not. She does have a family history of cancer in that her paternal grandfather had lung cancer and maternal grandfather had ENT cancer. There is no GYN cancers in the family. She quit smoking 8 years ago. Of note, she is seeing Dr. Melvyn Novas tomorrow in follow-up.  Review of Systems: Constitutional: + early satiety.  Denies fever. Skin: + psoriasis Cardiovascular: No chest pain, shortness of breath Pulmonary: +cough  Gastro Intestinal: Reporting intermittent lower abdominal soreness.  GI symptoms as above Genitourinary: No frequency, urgency, or dysuria.  Denies vaginal bleeding other than with menses.   Current Meds:  Outpatient Encounter Prescriptions as of 08/31/2017  Medication Sig  . cyclobenzaprine (FLEXERIL) 10 MG tablet Take 1 tablet by mouth 3 (three) times daily as needed.  . meloxicam (MOBIC) 15 MG tablet Take 1 tablet by mouth 2 (two) times daily.  . metFORMIN (GLUCOPHAGE) 500 MG tablet Take 500 mg by mouth 2 (two) times daily with a meal.   . Norgestimate-Ethinyl Estradiol Triphasic 0.18/0.215/0.25 MG-35 MCG tablet Take 1 tablet by mouth daily.   No facility-administered encounter medications on file as of 08/31/2017.     Allergy:  Allergies  Allergen Reactions  . Bactrim [Sulfamethoxazole-Trimethoprim] Rash   . Penicillins Rash    Social Hx:   Social History   Social History  . Marital status: Single    Spouse name: N/A  . Number of children: 1  . Years of education: 76   Occupational History  . [employer]   Social History Main Topics  . Smoking status: Former Smoker    Packs/day: 0.75    Years: 20.00    Quit date: 2010  . Smokeless tobacco: Never Used  . Alcohol use No  . Drug use: No  . Sexual activity: Yes   Other Topics Concern  . Not on file   Social History Narrative  . No narrative on file    Past Surgical Hx:  Past Surgical History:  Procedure Laterality Date  . none    . VIDEO BRONCHOSCOPY Bilateral 04/18/2017   Procedure: VIDEO BRONCHOSCOPY WITHOUT FLUORO;  Surgeon: Rush Farmer, MD;  Location: [facility] ENDOSCOPY;  Service: Endoscopy;  Laterality: Bilateral;    Past Medical Hx:  Past Medical History:  Diagnosis Date  . Asthma   . Bronchitis   . Diabetes mellitus without complication ([place])   . Lichen planus   . OSA (obstructive sleep apnea) 12/25/2013  . Pneumonia     Oncology Hx:   No history exists.    Family Hx:  Family History  Problem Relation Age of Onset  . Sleep apnea Mother   . Heart attack Brother     Vitals:  Blood pressure (!) 149/74, pulse 91, temperature  98.4 F (36.9 C), temperature source Oral, resp. rate 20, height 5\' 3"  (1.6 m), weight 217 lb 8 oz (98.7 kg), SpO2 98 %.  Physical Exam: Well-nourished vulva female in no acute distress  Neck: Supple, no lymphadenopathy, no thyromegaly  Lungs: Clear to auscultation bilaterally.  Cardiac: Regular rate and rhythm.  Abdomen: Morbidly obese, soft, nontender, not tympanitic. There is an abdominal mass palpated midway between the umbilicus and the sternum. It is nontender. Exam is somewhat limited by habitus.  Groins: No lymphadenopathy.  Extremities: Psoriasis. No other skin lesions. No edema.  Pelvic: External genitalia within normal limits. Bimanual  examination the cervix is palpably normal. I cannot appreciate the size of the uterus. There is a large abdominal mass that is ballotable. It is outside the true pelvis. On rectal examination there is no nodularity.  Assessment/Plan: 46 year old with large simple appearing adnexal mass that does appear to be arising from the left ovary. She has normal tumor markers no evidence of any metastatic disease. This is most consistent with a cystadenoma. I discussed with her as it does appear to be homogeneous with no nodularity that we could proceed with a mini laparotomy and controlled drainage of the mass with subsequent unilateral salpingo-oophorectomy. She understands if the mass is benign that will also remove the contralateral oral fallopian tube as a cancer risk reduction strategy and conclude the procedure. Should it be a malignancy, we would need to proceed with a larger incision and at that time would most likely proceed with a total abdominal hysterectomy, bilateral salpingo-oophorectomy and appropriate staging.  Risks of surgery including but are not limited to bleeding, infection, injury to surrounding organs and thromboembolic disease were discussed with the patient.  She is seeing Dr. Melvyn Novas tomorrow. We will get him a copy of our note from today. We do ask that if he has any other recommendations for her in the perioperative period with regards to her pulmonary issues to please notify us. She is tentatively scheduled for surgery on October 4 with Dr. Everitt Amber. She knows that she'll have the opportunity to meet with Dr. Denman George earlier that day.  Her questions were elicited in answer to her satisfaction.  It was a pleasure to participate the care of this very pleasant patient.  Russel Morain A., MD 08/31/2017, 1:57 PM

## 2017-09-15 NOTE — Anesthesia Postprocedure Evaluation (Signed)
Anesthesia Post Note  Patient: Denise Barnett  Procedure(s) Performed: EXPLORATORY LAPAROTOMY (Bilateral ) LEFT OOPHORECTOMY (Left ) LEFT SALPINGECTOMY (Left )     Patient location during evaluation: PACU Anesthesia Type: General Level of consciousness: awake and alert Pain management: pain level controlled Vital Signs Assessment: post-procedure vital signs reviewed and stable Respiratory status: spontaneous breathing, nonlabored ventilation, respiratory function stable and patient connected to nasal cannula oxygen Cardiovascular status: blood pressure returned to baseline and stable Postop Assessment: no apparent nausea or vomiting Anesthetic complications: no    Last Vitals:  Vitals:   09/15/17 0945 09/15/17 1000  BP: 123/74 118/76  Pulse: 74 72  Resp: 12 16  Temp: 36.4 C 36.9 C  SpO2: 99% 98%    Last Pain:  Vitals:   09/15/17 1000  TempSrc:   PainSc: 6                  Madia Carvell

## 2017-09-15 NOTE — Transfer of Care (Signed)
Immediate Anesthesia Transfer of Care Note  Patient: Denise Barnett  Procedure(s) Performed: EXPLORATORY LAPAROTOMY (Bilateral ) LEFT OOPHORECTOMY (Left ) LEFT SALPINGECTOMY (Left )  Patient Location: PACU  Anesthesia Type:General  Level of Consciousness: awake, alert  and oriented  Airway & Oxygen Therapy: Patient Spontanous Breathing and Patient connected to face mask oxygen  Post-op Assessment: Report given to RN and Post -op Vital signs reviewed and stable  Post vital signs: Reviewed and stable  Last Vitals:  Vitals:   09/15/17 0539  BP: 139/71  Pulse: 85  Resp: 18  Temp: 37 C  SpO2: 98%    Last Pain:  Vitals:   09/15/17 0539  TempSrc: Oral      Patients Stated Pain Goal: 4 (34/96/11 6435)  Complications: No apparent anesthesia complications

## 2017-09-15 NOTE — Anesthesia Procedure Notes (Signed)
Procedure Name: Intubation Date/Time: 09/15/2017 7:38 AM Performed by: Danley Danker L Patient Re-evaluated:Patient Re-evaluated prior to induction Oxygen Delivery Method: Circle system utilized Preoxygenation: Pre-oxygenation with 100% oxygen Induction Type: IV induction Ventilation: Mask ventilation without difficulty and Oral airway inserted - appropriate to patient size Laryngoscope Size: Miller and 2 Grade View: Grade I Tube type: Oral Tube size: 7.5 mm Number of attempts: 1 Airway Equipment and Method: Stylet Placement Confirmation: ETT inserted through vocal cords under direct vision,  positive ETCO2 and breath sounds checked- equal and bilateral Secured at: 21 cm Tube secured with: Tape Dental Injury: Teeth and Oropharynx as per pre-operative assessment

## 2017-09-15 NOTE — Telephone Encounter (Signed)
Scheduled patient's post op appt. Patient will receive appt with her discharge summary.

## 2017-09-15 NOTE — Op Note (Signed)
OPERATIVE NOTE Date: 09/15/17 Preoperative Diagnosis:  Ovarian cyst   Postoperative Diagnosis: left ovarian cyst    Procedure(s) Performed: Exploratory laparotomy with left salpingo-oophorectomy.  Surgeon: Thereasa Solo, MD.  Assistant Surgeon: Lahoma Crocker, M.D. Assistant: (an MD assistant was necessary for tissue manipulation, retraction and positioning due to the complexity of the case and hospital policies).   Specimens: left tube and ovary, washings   Estimated Blood Loss: <50 mL.    Urine [id number]  Complications: None.   Operative Findings: 20cm left ovarian unilocular cyst. Mucinous benign cyst on frozen section. Normal right tube and ovary. Extreme obesity prevented the ability to perform opportunistic right salpingectomy without significantly extending the incision, placing a retractor and increasing the complexity of the case.      Procedure:   The patient was seen in the Holding Room. The risks, benefits, complications, treatment options, and expected outcomes were discussed with the patient.  The patient concurred with the proposed plan, giving informed consent.   The patient was  identified as Denise Barnett  and the procedure verified as USO, possible staging. A Time Out was held and the above information confirmed upon entry to the operating room..  After induction of anesthesia, the patient was draped and prepped in the usual sterile manner.  She was prepped and draped in the normal sterile fashion in the dorsal lithotomy position in padded Allen stirrups with good attention paid to support of the lower back and lower extremities. Position was adjusted for appropriate support. A Foley catheter was placed to gravity.   A right paramedian vertical incision was made and carried through the subcutaneous tissue to the fascia. The fascial incision was made and extended superiorally. The rectus muscles were separated. The peritoneum was identified and entered. Peritoneal  incision was extended longitudinally.  The abdominal cavity was entered sharply and without incident. A Bookwalter retractor was then placed. A survey of the abdomen and pelvis revealed the above findings, which were significant for a 20cm left ovarian cyst without attachments elsewhere.  Washings were obtained from the peritoneal cavity.  An alexis retractor was placed.  The cystic mass was identified and brought to the incision. A gallbladder trochar was used to puncture the cyst and evacuate it of fluid to decompress it. No gross spillage of cyst fluid took place.Allis clamps were used to elevated the puncture site as the cyst was deflated.  The deflated cystic mass was delivered through the abdominal incision.  A window was made in the medial leaf of the broad ligament above the level of the ureter. The left utero-ovarian ligament and infundibulopelvic ligament were skeletonized and sealed and transected with clamps and suture. This liberated the ovary from its attachments. It was sent for frozen section which revealed benign mucinous cyst. Hemostasis was observed at the utero-ovarian and IP ligaments.  The retroperitoneum on the left was opened and the ureter was identified in the medial leaf of the broad ligament deep to the surgical pedicles. The uterus and contralateral ovary were inspected and noted to be normal.  The peritoneal cavity was irrigated and hemostasis was confirmed at all surgical sites.  The fascia was reapproximated with 0 looped PDS using a total of two sutures. The subcutaneous layer was then irrigated copiously.  Exparel long acting local anesthetic was infiltrated into the subcutaneous tissues. The skin was closed with subcuticular suture. The patient tolerated the procedure well.   Sponge, lap and needle counts were correct x 2.   [name],  MD

## 2017-09-15 NOTE — Interval H&P Note (Signed)
History and Physical Interval Note:  09/15/2017 7:11 AM  Denise Barnett  has presented today for surgery, with the diagnosis of PELVIC MASS  The various methods of treatment have been discussed with the patient and family. After consideration of risks, benefits and other options for treatment, the patient has consented to  Procedure(s): EXPLORATORY LAPAROTOMY (Bilateral) UNILATERAL OOPHORECTOMY (Bilateral) BILATERAL SALPINGECTOMY (Bilateral) POSSIBLE LAPAROTOMY WITH STAGING (Bilateral) as a surgical intervention .  The patient's history has been reviewed, patient examined, no change in status, stable for surgery.  I have reviewed the patient's chart and labs.  Questions were answered to the patient's satisfaction.     [name]

## 2017-09-16 DIAGNOSIS — D3912 Neoplasm of uncertain behavior of left ovary: Secondary | ICD-10-CM | POA: Diagnosis not present

## 2017-09-16 LAB — CBC
HEMATOCRIT: 33.4 % — AB (ref 36.0–46.0)
Hemoglobin: 11.3 g/dL — ABNORMAL LOW (ref 12.0–15.0)
MCH: 27.7 pg (ref 26.0–34.0)
MCHC: 33.8 g/dL (ref 30.0–36.0)
MCV: 81.9 fL (ref 78.0–100.0)
Platelets: 291 10*3/uL (ref 150–400)
RBC: 4.08 MIL/uL (ref 3.87–5.11)
RDW: 13.6 % (ref 11.5–15.5)
WBC: 14.9 10*3/uL — AB (ref 4.0–10.5)

## 2017-09-16 LAB — BASIC METABOLIC PANEL
ANION GAP: 10 (ref 5–15)
BUN: 7 mg/dL (ref 6–20)
CALCIUM: 8.9 mg/dL (ref 8.9–10.3)
CO2: 25 mmol/L (ref 22–32)
Chloride: 104 mmol/L (ref 101–111)
Creatinine, Ser: 0.55 mg/dL (ref 0.44–1.00)
GFR calc non Af Amer: >60 mL/min (ref >60)
Glucose, Bld: 156 mg/dL — ABNORMAL HIGH (ref 65–99)
POTASSIUM: 3.9 mmol/L (ref 3.5–5.1)
Sodium: 139 mmol/L (ref 135–145)

## 2017-09-16 LAB — GLUCOSE, CAPILLARY
Glucose-Capillary: 118 mg/dL — ABNORMAL HIGH (ref 65–99)
Glucose-Capillary: 209 mg/dL — ABNORMAL HIGH (ref 65–99)

## 2017-09-16 MED ORDER — OXYCODONE-ACETAMINOPHEN 5-325 MG PO TABS: 1.0000 | ORAL_TABLET | ORAL | 0 refills | Status: DC | PRN

## 2017-09-16 MED ORDER — SIMETHICONE 80 MG PO CHEW
80.0000 mg | CHEWABLE_TABLET | Freq: Four times a day (QID) | ORAL | Status: DC | PRN
  Administered 2017-09-16: 80 mg via ORAL
  Filled 2017-09-16: qty 1

## 2017-09-16 MED ORDER — BISACODYL 10 MG RE SUPP: 10.0000 mg | Freq: Every day | RECTAL | Status: DC | PRN

## 2017-09-16 NOTE — Discharge Instructions (Signed)
09/16/2017  Return to work: 4-6 weeks if applicable  Activity: 1. Be up and out of the bed during the day.  Take a nap if needed.  You may walk up steps but be careful and use the hand rail.  Stair climbing will tire you more than you think, you may need to stop part way and rest.   2. No lifting or straining for 6 weeks.  3. No driving for 2 week(s).  Do not drive if you are taking narcotic pain medicine.  4. Shower daily.  Use soap and water on your incision and pat dry; don't rub.  No tub baths until cleared by your surgeon.   5. No sexual activity and nothing in the vagina for 6 weeks.  6. You may experience a small amount of clear drainage from your incision, which is normal.  If the drainage persists or increases, please call the office.  7. Remove the dressing from your abdomen after five days from surgery.  No dressing will need to be added once removed.  Keep clean and dry.  8. Take Tylenol or ibuprofen first for pain and only use Percocet for severe pain not relieved by the Tylenol or Ibuprofen.  Monitor your Tylenol intake to a max of 4,000 mg a day since Percocet has Tylenol in it as well.  Diet: 1. Low sodium Heart Healthy Diet is recommended.  2. It is safe to use a laxative, such as Miralax or Colace, if you have difficulty moving your bowels. You can take Sennakot at bedtime every evening to keep bowel movements regular and to prevent constipation.    Wound Care: 1. Keep clean and dry.  Shower daily.  Reasons to call the Doctor:  Fever - Oral temperature greater than 100.4 degrees Fahrenheit  Foul-smelling vaginal discharge  Difficulty urinating  Nausea and vomiting  Increased pain at the site of the incision that is unrelieved with pain medicine.  Difficulty breathing with or without chest pain  New calf pain especially if only on one side  Sudden, continuing increased vaginal bleeding with or without clots.   Contacts: For questions or concerns you  should contact:  Dr. Everitt Amber at [phone]  Denise John, NP at [phone]  After Hours: call [phone] and have the GYN Oncologist paged/contacted  Acetaminophen; Oxycodone tablets What is this medicine? ACETAMINOPHEN; OXYCODONE (a set a MEE noe fen; ox i KOE done) is a pain reliever. It is used to treat moderate to severe pain. This medicine may be used for other purposes; ask your health care provider or pharmacist if you have questions. COMMON BRAND NAME(S): Endocet, Magnacet, Narvox, Percocet, Perloxx, Primalev, Primlev, Roxicet, Xolox What should I tell my health care provider before I take this medicine? They need to know if you have any of these conditions: -brain tumor -Crohn's disease, inflammatory bowel disease, or ulcerative colitis -drug abuse or addiction -head injury -heart or circulation problems -if you often drink alcohol -kidney disease or problems going to the bathroom -liver disease -lung disease, asthma, or breathing problems -an unusual or allergic reaction to acetaminophen, oxycodone, other opioid analgesics, other medicines, foods, dyes, or preservatives -pregnant or trying to get pregnant -breast-feeding How should I use this medicine? Take this medicine by mouth with a full glass of water. Follow the directions on the prescription label. You can take it with or without food. If it upsets your stomach, take it with food. Take your medicine at regular intervals. Do not take it more often  than directed. A special MedGuide will be given to you by the pharmacist with each prescription and refill. Be sure to read this information carefully each time. Talk to your pediatrician regarding the use of this medicine in children. Special care may be needed. Overdosage: If you think you have taken too much of this medicine contact a poison control center or emergency room at once. NOTE: This medicine is only for you. Do not share this medicine with  others. What if I miss a dose? If you miss a dose, take it as soon as you can. If it is almost time for your next dose, take only that dose. Do not take double or extra doses. What may interact with this medicine? This medicine may interact with the following medications: -alcohol -antihistamines for allergy, cough and cold -antiviral medicines for HIV or AIDS -atropine -certain antibiotics like clarithromycin, erythromycin, linezolid, rifampin -certain medicines for anxiety or sleep -certain medicines for bladder problems like oxybutynin, tolterodine -certain medicines for depression like amitriptyline, fluoxetine, sertraline -certain medicines for fungal infections like ketoconazole, itraconazole, voriconazole -certain medicines for migraine headache like almotriptan, eletriptan, frovatriptan, naratriptan, rizatriptan, sumatriptan, zolmitriptan -certain medicines for nausea or vomiting like dolasetron, ondansetron, palonosetron -certain medicines for Parkinson's disease like benztropine, trihexyphenidyl -certain medicines for seizures like phenobarbital, phenytoin, primidone -certain medicines for stomach problems like dicyclomine, hyoscyamine -certain medicines for travel sickness like scopolamine -diuretics -general anesthetics like halothane, isoflurane, methoxyflurane, propofol -ipratropium -local anesthetics like lidocaine, pramoxine, tetracaine -MAOIs like Carbex, Eldepryl, Marplan, Nardil, and Parnate -medicines that relax muscles for surgery -methylene blue -nilotinib -other medicines with acetaminophen -other narcotic medicines for pain or cough -phenothiazines like chlorpromazine, mesoridazine, prochlorperazine, thioridazine This list may not describe all possible interactions. Give your health care provider a list of all the medicines, herbs, non-prescription drugs, or dietary supplements you use. Also tell them if you smoke, drink alcohol, or use illegal drugs. Some items  may interact with your medicine. What should I watch for while using this medicine? Tell your doctor or health care professional if your pain does not go away, if it gets worse, or if you have new or a different type of pain. You may develop tolerance to the medicine. Tolerance means that you will need a higher dose of the medication for pain relief. Tolerance is normal and is expected if you take this medicine for a long time. Do not suddenly stop taking your medicine because you may develop a severe reaction. Your body becomes used to the medicine. This does NOT mean you are addicted. Addiction is a behavior related to getting and using a drug for a non-medical reason. If you have pain, you have a medical reason to take pain medicine. Your doctor will tell you how much medicine to take. If your doctor wants you to stop the medicine, the dose will be slowly lowered over time to avoid any side effects. There are different types of narcotic medicines (opiates). If you take more than one type at the same time or if you are taking another medicine that also causes drowsiness, you may have more side effects. Give your health care provider a list of all medicines you use. Your doctor will tell you how much medicine to take. Do not take more medicine than directed. Call emergency for help if you have problems breathing or unusual sleepiness. Do not take other medicines that contain acetaminophen with this medicine. Always read labels carefully. If you have questions, ask your doctor or pharmacist. If  you take too much acetaminophen get medical help right away. Too much acetaminophen can be very dangerous and cause liver damage. Even if you do not have symptoms, it is important to get help right away. You may get drowsy or dizzy. Do not drive, use machinery, or do anything that needs mental alertness until you know how this medicine affects you. Do not stand or sit up quickly, especially if you are an older patient.  This reduces the risk of dizzy or fainting spells. Alcohol may interfere with the effect of this medicine. Avoid alcoholic drinks. The medicine will cause constipation. Try to have a bowel movement at least every 2 to 3 days. If you do not have a bowel movement for 3 days, call your doctor or health care professional. Your mouth may get dry. Chewing sugarless gum or sucking hard candy, and drinking plenty or water may help. Contact your doctor if the problem does not go away or is severe. What side effects may I notice from receiving this medicine? Side effects that you should report to your doctor or health care professional as soon as possible: -allergic reactions like skin rash, itching or hives, swelling of the face, lips, or tongue -breathing problems -confusion -redness, blistering, peeling or loosening of the skin, including inside the mouth -signs and symptoms of liver injury like dark yellow or brown urine; general ill feeling or flu-like symptoms; light-colored stools; loss of appetite; nausea; right upper belly pain; unusually weak or tired; yellowing of the eyes or skin -signs and symptoms of low blood pressure like dizziness; feeling faint or lightheaded, falls; unusually weak or tired -trouble passing urine or change in the amount of urine Side effects that usually do not require medical attention (report to your doctor or health care professional if they continue or are bothersome): -constipation -dry mouth -nausea, vomiting -tiredness This list may not describe all possible side effects. Call your doctor for medical advice about side effects. You may report side effects to FDA at 1-800-FDA-1088. Where should I keep my medicine? Keep out of the reach of children. This medicine can be abused. Keep your medicine in a safe place to protect it from theft. Do not share this medicine with anyone. Selling or giving away this medicine is dangerous and against the law. This medicine may  cause accidental overdose and death if it taken by other adults, children, or pets. Mix any unused medicine with a substance like cat litter or coffee grounds. Then throw the medicine away in a sealed container like a sealed bag or a coffee can with a lid. Do not use the medicine after the expiration date. Store at room temperature between 20 and 25 degrees C (68 and 77 degrees F). NOTE: This sheet is a summary. It may not cover all possible information. If you have questions about this medicine, talk to your doctor, pharmacist, or health care provider.  2018 Elsevier/Gold Standard (2015-08-25 21:48:12)

## 2017-09-16 NOTE — Progress Notes (Signed)
Patient's pain under control, ambulating well, passing gas and has voided.  Discharge instructions given and patient express understanding.  Will d/c home in private vehicle.

## 2017-09-16 NOTE — Discharge Summary (Signed)
Physician Discharge Summary  Patient ID: Denise Barnett MRN: [id number] DOB/AGE: November 03, 1971 46 y.o.  Admit date: 09/15/2017 Discharge date: 09/16/2017  Admission Diagnoses: Pelvic mass  Discharge Diagnoses:  Principal Problem:   Pelvic mass Active Problems:   OSA (obstructive sleep apnea)   Morbid obesity due to excess calories ([place]) complicated by DM    Abdominal pain   Pelvic mass in female   Discharged Condition:  The patient is in good condition and stable for discharge.    Hospital Course: On 09/15/2017, the patient underwent the following: Procedure(s): EXPLORATORY LAPAROTOMY LEFT OOPHORECTOMY LEFT SALPINGECTOMY.   The postoperative course was uneventful.  She was discharged to home on postoperative day 1 tolerating a regular diet, ambulating, voiding, pain controlled with PO pain medication.  Consults: None  Significant Diagnostic Studies: None  Treatments: surgery: see above  Discharge Exam: Blood pressure 108/66, pulse 71, temperature 98.5 F (36.9 C), temperature source Oral, resp. rate 16, height 5\' 2"  (1.575 m), weight 218 lb (98.9 kg), last menstrual period 09/06/2017, SpO2 96 %. General appearance: alert, cooperative and no distress Resp: clear to auscultation bilaterally Cardio: regular rate and rhythm, S1, S2 normal, no murmur, click, rub or gallop GI: soft, non-tender; bowel sounds normal; no masses,  no organomegaly and abdomen morbidly obese Extremities: extremities normal, atraumatic, no cyanosis or edema Incision/Wound: Midline incision with honeycomb dressing in place with no erythema or drainage noted  Disposition: 01-Home or Self Care  Discharge Instructions    Call MD for:  difficulty breathing, headache or visual disturbances    Complete by:  As directed    Call MD for:  extreme fatigue    Complete by:  As directed    Call MD for:  hives    Complete by:  As directed    Call MD for:  persistant dizziness or light-headedness    Complete by:  As  directed    Call MD for:  persistant nausea and vomiting    Complete by:  As directed    Call MD for:  redness, tenderness, or signs of infection (pain, swelling, redness, odor or green/yellow discharge around incision site)    Complete by:  As directed    Call MD for:  severe uncontrolled pain    Complete by:  As directed    Call MD for:  temperature >100.4    Complete by:  As directed    Diet - low sodium heart healthy    Complete by:  As directed    Driving Restrictions    Complete by:  As directed    No driving for 2 weeks.  Do not take narcotics and drive.   Increase activity slowly    Complete by:  As directed    Lifting restrictions    Complete by:  As directed    No lifting greater than 10 lbs.   Sexual Activity Restrictions    Complete by:  As directed    No sexual activity, nothing in the vagina, for 6 weeks.     Allergies as of 09/16/2017      Reactions   Bactrim [sulfamethoxazole-trimethoprim] Rash   Penicillins Rash   Has patient had a PCN reaction causing immediate rash, facial/tongue/throat swelling, SOB or lightheadedness with hypotension: No Has patient had a PCN reaction causing severe rash involving mucus membranes or skin necrosis: Unknown Has patient had a PCN reaction that required hospitalization: No Has patient had a PCN reaction occurring within the last 10 years: No If all  of the above answers are "NO", then may proceed with Cephalosporin use.      Medication List    TAKE these medications   clobetasol cream 0.05 % Commonly known as:  TEMOVATE Apply 1 application topically daily as needed (psoriasis).   cyclobenzaprine 10 MG tablet Commonly known as:  FLEXERIL Take 1 tablet by mouth daily as needed for muscle spasms.   ibuprofen 200 MG tablet Commonly known as:  ADVIL,MOTRIN Take 600 mg by mouth every 8 (eight) hours as needed for mild pain.   meloxicam 15 MG tablet Commonly known as:  MOBIC Take 1 tablet by mouth daily as needed for  pain.   metFORMIN 500 MG tablet Commonly known as:  GLUCOPHAGE Take 500 mg by mouth 2 (two) times daily with a meal.   Norgestimate-Ethinyl Estradiol Triphasic 0.18/0.215/0.25 MG-35 MCG tablet Take 1 tablet by mouth daily.   oxyCODONE-acetaminophen 5-325 MG tablet Commonly known as:  PERCOCET/ROXICET Take 1-2 tablets by mouth every 4 (four) hours as needed (moderate to severe pain).   POTASSIMIN PO Take 1 tablet by mouth daily.        Greater than thirty minutes were spend for face to face discharge instructions and discharge orders/summary in EPIC.   Signed: Briannia Laba DEAL 09/16/2017, 9:45 AM

## 2017-09-16 NOTE — Plan of Care (Signed)
Problem: Safety: Goal: Ability to remain free from injury will improve Outcome: Progressing No safety issues noted  Problem: Pain Managment: Goal: General experience of comfort will improve Outcome: Progressing Pain well controlled withpercocet  Problem: Physical Regulation: Goal: Will remain free from infection Outcome: Progressing No signs of infection noted  Problem: Tissue Perfusion: Goal: Risk factors for ineffective tissue perfusion will decrease Outcome: Progressing On Lovenox , SCDs are on, patient ambulated  Problem: Bowel/Gastric: Goal: Gastrointestinal status for postoperative course will improve Outcome: Progressing Positive hypoactive bowel sounds

## 2017-09-19 ENCOUNTER — Telehealth: Payer: Self-pay | Admitting: Gynecologic Oncology

## 2017-09-19 NOTE — Telephone Encounter (Signed)
Called to check in on patient.  She states she is doing well.  Bladder and bowels functioning.  No concerns voiced.  Advised to call for any needs.

## 2017-09-26 ENCOUNTER — Telehealth: Payer: Self-pay

## 2017-09-26 DIAGNOSIS — R300 Dysuria: Secondary | ICD-10-CM

## 2017-09-26 NOTE — Telephone Encounter (Signed)
Denise Barnett states that she has a firm place above her belly button incision. No swelling noted. She noticed it a couple of days ago. She did not know if this were normal post -op. The area is not red or tender to touch .Afebrile. She went out to the grocery store this weekend. She may have done more then she should have. Her bowels are moving well. She has some pressure while urinating. No burning. Onset set 2 days ago. Pt's pain leve is 6/10.  She has ~1 Percocet tablet left. She has been taking Ibuprofen 2 tablets ~2-3 times a day. Reviewed with Melissa Cross,NP.  She recommended increasing the Ibuprofen to 600 mg q 6 hrs or 800 mg q 8 hrs  this evening and see if pain level decreases by tomorrow.  Pt top increase fluids tlo 64 oz a day. Denise Barnett to call back tomorrow am with update on pain level. If she continues with the abdominal pressure with urination, she will need to come to the office for a U/a and C&S tomorrow.

## 2017-09-27 NOTE — Telephone Encounter (Signed)
Denise Barnett states that her abdominal pain is slightly better.  Currently her pain is a 4/10.  It was 6/10 prior to Ibuprofen ~ 90 minutes ago. She states that the firmness above  her belly button remains the same except that it seems to has moved. She continues to have lower abdominal pressure with urination.  The pressure has slightly decreased. Pt will come to see Joylene John, NP tomorrow 09-28-17 at 1330 and have give a urine specimen in the lab at 1315.  Pt not able to drive as post op and cannot coordinate a ride for this afternoon.

## 2017-09-28 ENCOUNTER — Other Ambulatory Visit (HOSPITAL_BASED_OUTPATIENT_CLINIC_OR_DEPARTMENT_OTHER): Payer: Managed Care, Other (non HMO)

## 2017-09-28 ENCOUNTER — Telehealth: Payer: Self-pay | Admitting: Internal Medicine

## 2017-09-28 ENCOUNTER — Ambulatory Visit: Payer: Managed Care, Other (non HMO) | Attending: Gynecologic Oncology | Admitting: Gynecologic Oncology

## 2017-09-28 DIAGNOSIS — R300 Dysuria: Secondary | ICD-10-CM | POA: Diagnosis not present

## 2017-09-28 DIAGNOSIS — Z8249 Family history of ischemic heart disease and other diseases of the circulatory system: Secondary | ICD-10-CM | POA: Diagnosis not present

## 2017-09-28 DIAGNOSIS — E109 Type 1 diabetes mellitus without complications: Secondary | ICD-10-CM | POA: Insufficient documentation

## 2017-09-28 DIAGNOSIS — Z882 Allergy status to sulfonamides status: Secondary | ICD-10-CM | POA: Insufficient documentation

## 2017-09-28 DIAGNOSIS — Z90721 Acquired absence of ovaries, unilateral: Secondary | ICD-10-CM | POA: Diagnosis not present

## 2017-09-28 DIAGNOSIS — Z8701 Personal history of pneumonia (recurrent): Secondary | ICD-10-CM | POA: Insufficient documentation

## 2017-09-28 DIAGNOSIS — Z6837 Body mass index (BMI) 37.0-37.9, adult: Secondary | ICD-10-CM | POA: Diagnosis not present

## 2017-09-28 DIAGNOSIS — Z880 Allergy status to penicillin: Secondary | ICD-10-CM | POA: Insufficient documentation

## 2017-09-28 DIAGNOSIS — Z7984 Long term (current) use of oral hypoglycemic drugs: Secondary | ICD-10-CM | POA: Insufficient documentation

## 2017-09-28 DIAGNOSIS — G4733 Obstructive sleep apnea (adult) (pediatric): Secondary | ICD-10-CM | POA: Insufficient documentation

## 2017-09-28 DIAGNOSIS — Z87891 Personal history of nicotine dependence: Secondary | ICD-10-CM | POA: Diagnosis not present

## 2017-09-28 DIAGNOSIS — G8918 Other acute postprocedural pain: Secondary | ICD-10-CM | POA: Diagnosis not present

## 2017-09-28 DIAGNOSIS — Z79899 Other long term (current) drug therapy: Secondary | ICD-10-CM | POA: Insufficient documentation

## 2017-09-28 DIAGNOSIS — R109 Unspecified abdominal pain: Secondary | ICD-10-CM | POA: Diagnosis not present

## 2017-09-28 DIAGNOSIS — R1900 Intra-abdominal and pelvic swelling, mass and lump, unspecified site: Secondary | ICD-10-CM

## 2017-09-28 LAB — URINALYSIS, MICROSCOPIC - CHCC
BILIRUBIN (URINE): NEGATIVE
BLOOD: NEGATIVE
Glucose: NEGATIVE mg/dL
Ketones: NEGATIVE mg/dL
Leukocyte Esterase: NEGATIVE
NITRITE: NEGATIVE
Protein: NEGATIVE mg/dL
SPECIFIC GRAVITY, URINE: 1.02 (ref 1.003–1.035)
UROBILINOGEN UR: 0.2 mg/dL (ref 0.2–1)
pH: 6 (ref 4.6–8.0)

## 2017-09-28 MED ORDER — OXYCODONE-ACETAMINOPHEN 5-325 MG PO TABS: 1.0000 | ORAL_TABLET | Freq: Four times a day (QID) | ORAL | 0 refills | Status: DC | PRN

## 2017-09-28 NOTE — Patient Instructions (Signed)
Plan on taking the percocet at nighttime as needed for severe pain and continue Tylenol or ibuprofen during the day.  Please call if the area on your incision becomes bigger, red, painful, change in bowels, nausea, vomiting.  We will see you at your follow up or sooner if needed.

## 2017-09-28 NOTE — Telephone Encounter (Signed)
Spoke with patient. She had a bronchoscopy performed back in May 2018 and informed that she should stop taking Humira. At the time, she was taking this medication to help with her psoriasis. She has not started back to taking the medication but feels like that she needs to due to her psoriasis becoming worse.   She wants to know if this medication will have any negative effects on her lungs.   MW, please advise. Thanks!

## 2017-09-28 NOTE — Telephone Encounter (Signed)
No problem from my perspective and completely defer to whoever was prescribing it previously for further instructions

## 2017-09-28 NOTE — Telephone Encounter (Signed)
Spoke with the pt and notified of recs per MW  She verbalized understanding and nothing further needed 

## 2017-09-29 LAB — URINE CULTURE

## 2017-09-30 ENCOUNTER — Encounter: Payer: Self-pay | Admitting: Gynecologic Oncology

## 2017-09-30 NOTE — Progress Notes (Signed)
Follow Up Note: Gyn-Onc  Denise Barnett 46 y.o. female  CC:  Chief Complaint  Patient presents with  . Abdominal Pain Post-op    Follow up  . Pressure with Urination    HPI:  Denise Barnett is a 46 year old female initially seen at the consultation of Dr. Jerelyn Charles.  She reported intermittent abdominal pain in the right upper quadrant for the past few months but she was unsure if it was related to her lung issues.  She began having issues with her lungs and was evaluated by Dr. Melvyn Novas as she had right back pain that was worse with deep inspiration. Patient was seen on 03/11/2017 with right lateral chest pain at that time chest x-ray showed small postinfectious pneumatocyst and was discharged on Levaquin. She had a follow-up chest x-ray and  CT chest done which showed progression of the opacities visualized on 03/11/2017. She was subsequently admitted and have really been on and off antibiotics as well as steroids and inhalers. She has a history of sleep apnea. Ultimately she had a repeat CT of the chest done, results of which are below.  CT chest 8/18: IMPRESSION: 1. Waxing and waning pattern of multifocal mass-like and nodular areas of apparent airspace consolidation throughout the lungs bilaterally (right greater than left), as detailed above. Overall, this process appears slightly progressive compared to the prior examination. There is associated lymphadenopathy in the mediastinum and upper abdomen. This appearance is highly unusual, and carries a broad differential diagnosis which includes atypical infection, pulmonary vasculitides, and neoplasm such as lymphoma (e.g., MALToma). Tissue sampling of one of these lesions is suggested to establish a tissue diagnosis, if clinically appropriate. 2. Aortic atherosclerosis, in addition to left anterior descending coronary artery disease. Please note that although the presence of coronary artery calcium documents the presence of coronary arterym  disease, the severity of this disease and any potential stenosis cannot be assessed on this non-gated CT examination. Assessment for potential risk factor modification, dietary therapy or pharmacologic therapy may be warranted, if clinically indicated. 3. Hepatic steatosis. CHEST: No hypermetabolic mediastinal, hilar or axillary lymph nodes. There are resolving areas of peribronchovascular consolidation in the lungs bilaterally, improved from 07/21/2017 and without abnormal hypermetabolism above blood pool. Heart is at the upper limits of normal in size to mildly enlarged. No pericardial or pleural effusion.  As a result of the lesions on her chest CT, she subsequently underwent a PET/CT.  PET CT 8/18 ABDOMEN/PELVIS: No abnormal hypermetabolism in the liver, adrenal glands, spleen or pancreas. No hypermetabolic lymph nodes. Liver, gallbladder, adrenal glands, kidneys, spleen, pancreas, stomach and bowel are grossly unremarkable. A 16.9 x 21.7 cm cystic mass is seen in the central anatomic pelvis with soft tissue along the inferior left posterolateral margin (CT image 173). No associated abnormal hypermetabolism. Lesion likely arises from the left ovary. Small to borderline enlarged mesenteric lymph nodes and mesenteric haziness, again, without abnormal hypermetabolism.  SKELETON: No abnormal osseous hypermetabolism.  IMPRESSION: 1. Resolving areas of peribronchovascular pulmonary parenchymal consolidation without abnormal hypermetabolism, indicative of an infectious or inflammatory process. Organizing pneumonia can alsohave this appearance. 2. 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   She was seen by Dr. Carlis Abbott in follow-up of the mass noted on CT scan and underwent an ultrasound on September 7 revealed a cystic mass measuring 19.4 x 15.9 x 20.2 cm with internal echoes with no flow. She had a CA-125 of 22.2  and a CEA of 0.8. Her  hemoglobin A1c was 6.1. She does have up-to-date Pap smears as well as mammograms.   On 09/15/17, she underwent an exploratory laparotomy with left salpingo-oophorectomy with Dr. Everitt Amber.  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    Interval History:  She presents today alone for evaluation of abdominal discomfort, firmness felt to the right of the upper portion of the incision, and pressure with urination.  She reports doing well at home except for intermittent pain in her right abdomen.  Tolerating diet with no nausea or emesis.  Bowels functioning and passing flatus.  Urinating without dysuria but reporting lower pelvic pain with urination.  She states her pain is more severe in the evenings.  She has been taking Tylenol and ibuprofen during the pain but it does not relieve her pain in the evenings.  She states she bought an abdominal binder to see if that would assist with relief of her symptoms but she found it to make her uncomfortable.  No erythema or drainage reported from the incision.  No other concerns voiced.   Review of Systems  Constitutional: Feels well except for abdominal discomfort.  No fever, chills, early satiety.  Cardiovascular: No chest pain, shortness of breath, or edema.  Pulmonary: No cough or wheeze.  Gastrointestinal: No nausea, vomiting, or diarrhea. No bright red blood per rectum or change in bowel movement.  Genitourinary: No frequency, urgency, or dysuria. No vaginal bleeding or discharge.  Musculoskeletal: No myalgia or joint pain Neurologic: No weakness, numbness, or change in gait.  Psychology: No depression, anxiety, or insomnia  Current Meds:  Outpatient Encounter Prescriptions as of 09/28/2017  Medication Sig  . Adalimumab (HUMIRA PEN) 40 MG/0.8ML PNKT Humira Pen 40 mg/0.8 mL subcutaneous kit  . clobetasol cream (TEMOVATE) 8.46 % Apply 1 application topically daily as needed (psoriasis).   . cyclobenzaprine (FLEXERIL) 10 MG tablet Take 1 tablet by mouth daily as needed for muscle spasms.   [name] ibuprofen (ADVIL,MOTRIN) 200 MG tablet Take 600 mg by mouth every 8 (eight) hours as needed for mild pain.  . meloxicam (MOBIC) 15 MG tablet Take 1 tablet by mouth daily as needed for pain.   . metFORMIN (GLUCOPHAGE) 500 MG tablet Take 500 mg by mouth 2 (two) times daily with a meal.   . Norgestimate-Ethinyl Estradiol Triphasic 0.18/0.215/0.25 MG-35 MCG tablet Take 1 tablet by mouth daily.  [name] oxyCODONE-acetaminophen (PERCOCET/ROXICET) 5-325 MG tablet Take 1 tablet by mouth every 6 (six) hours as needed (moderate to severe pain).  . Potassium (POTASSIMIN PO) Take 1 tablet by mouth daily.  . [DISCONTINUED] oxyCODONE-acetaminophen (PERCOCET/ROXICET) 5-325 MG tablet Take 1-2 tablets by mouth every 4 (four) hours as needed (moderate to severe pain).   No facility-administered encounter medications on file as of 09/28/2017.     Allergy:  Allergies  Allergen Reactions  . Bactrim [Sulfamethoxazole-Trimethoprim] Rash  . Penicillins Rash    Has patient had a PCN reaction causing immediate rash, facial/tongue/throat swelling, SOB or lightheadedness with hypotension: No Has patient had a PCN reaction causing severe rash involving mucus membranes or skin necrosis: Unknown Has patient had a PCN reaction that required hospitalization: No Has patient had a PCN reaction occurring within the last 10 years: No If all of the above answers are "NO", then may proceed with Cephalosporin use.     Social Hx:   Social History   Social History  . Marital status:  Single    Spouse name: N/A  . Number of children: 1  . Years of education: 68   Occupational History  . quality [employer]   Social History Main Topics  . Smoking status: Former Smoker    Packs/day: 0.75    Years: 20.00    Quit date: 2010  . Smokeless tobacco: Never Used  . Alcohol use No  . Drug use: No  . Sexual  activity: Yes   Other Topics Concern  . Not on file   Social History Narrative  . No narrative on file    Past Surgical Hx:  Past Surgical History:  Procedure Laterality Date  . BILATERAL SALPINGECTOMY Left 09/15/2017   Procedure: LEFT SALPINGECTOMY;  Surgeon: Everitt Amber, MD;  Location: [facility];  Service: Gynecology;  Laterality: Left;  . LAPAROTOMY Bilateral 09/15/2017   Procedure: EXPLORATORY LAPAROTOMY;  Surgeon: Everitt Amber, MD;  Location: [facility];  Service: Gynecology;  Laterality: Bilateral;  . none    . OOPHORECTOMY Left 09/15/2017   Procedure: LEFT OOPHORECTOMY;  Surgeon: Everitt Amber, MD;  Location: [facility];  Service: Gynecology;  Laterality: Left;  [name] VIDEO BRONCHOSCOPY Bilateral 04/18/2017   Procedure: VIDEO BRONCHOSCOPY WITHOUT FLUORO;  Surgeon: Rush Farmer, MD;  Location: [facility];  Service: Endoscopy;  Laterality: Bilateral;    Past Medical Hx:  Past Medical History:  Diagnosis Date  . Bronchitis   . Diabetes mellitus without complication ([place])    type I   . Heel spur, left   . Lichen planus   . OSA (obstructive sleep apnea) 12/25/2013   cpap- does not know settings   . Pneumonia    hx of     Family Hx:  Family History  Problem Relation Age of Onset  . Sleep apnea Mother   . Heart attack Brother     Vitals:  Blood pressure (!) 144/75, pulse 88, temperature 98.4 F (36.9 C), temperature source Oral, resp. rate 18, weight 206 lb 6.4 oz (93.6 kg), last menstrual period 09/06/2017, SpO2 97 %.  Physical Exam:  General: Well developed, well nourished female in no acute distress. Alert and oriented x 3.  Cardiovascular: Regular rate and rhythm. S1 and S2 normal.  Lungs: Clear to auscultation bilaterally. No wheezes/crackles/rhonchi noted.  Skin: No rashes or lesions present. Back: No CVA tenderness.  Abdomen: Abdomen soft, non-tender and obese. Active bowel sounds in all quadrants. No evidence of a fluid wave or abdominal masses.  Mild firmness noted to the  upper right of incision most likely related to expected post-op changes, more pronounced when patient standing.  No evidence of herniation.  Examination limited due to morbid obesity.  Incision well healed without erythema or drainage. Extremities: No bilateral cyanosis, edema, or clubbing.   Assessment/Plan:  46 year old female s/p exploratory laparotomy with left salpingo-oophorectomy with Dr. Everitt Amber for mucinous borderline tumor of the ovary.  She is doing well post-operatively except for abdominal discomfort, pressure.  We will obtain a urine sample today to rule out urinary infection due to new onset pelvic pressure with urination post-op.  Reportable signs and symptoms reviewed.  Patient advised to call if the abdominal pain worsens, firmness in her abdomen increases, nausea, vomiting, change in bowels, erythema or drainage at her incision.  Script given for percocet for patient to take in the evenings as needed for severe pain.  She is to follow up as scheduled or sooner if needed.    CROSS, MELISSA DEAL, NP 09/30/2017,  10:55 AM

## 2017-10-17 ENCOUNTER — Encounter: Payer: Self-pay | Admitting: Gynecologic Oncology

## 2017-10-17 ENCOUNTER — Ambulatory Visit: Payer: Managed Care, Other (non HMO) | Attending: Gynecologic Oncology | Admitting: Gynecologic Oncology

## 2017-10-17 VITALS — BP 134/69 | HR 86 | Temp 98.5°F | Resp 20 | Wt 210.4 lb

## 2017-10-17 DIAGNOSIS — Z801 Family history of malignant neoplasm of trachea, bronchus and lung: Secondary | ICD-10-CM | POA: Diagnosis not present

## 2017-10-17 DIAGNOSIS — R6881 Early satiety: Secondary | ICD-10-CM | POA: Diagnosis not present

## 2017-10-17 DIAGNOSIS — M549 Dorsalgia, unspecified: Secondary | ICD-10-CM | POA: Diagnosis not present

## 2017-10-17 DIAGNOSIS — R1900 Intra-abdominal and pelvic swelling, mass and lump, unspecified site: Secondary | ICD-10-CM | POA: Diagnosis present

## 2017-10-17 DIAGNOSIS — G4733 Obstructive sleep apnea (adult) (pediatric): Secondary | ICD-10-CM | POA: Diagnosis not present

## 2017-10-17 DIAGNOSIS — Z9889 Other specified postprocedural states: Secondary | ICD-10-CM | POA: Insufficient documentation

## 2017-10-17 DIAGNOSIS — Z79891 Long term (current) use of opiate analgesic: Secondary | ICD-10-CM | POA: Diagnosis not present

## 2017-10-17 DIAGNOSIS — Z880 Allergy status to penicillin: Secondary | ICD-10-CM | POA: Diagnosis not present

## 2017-10-17 DIAGNOSIS — E119 Type 2 diabetes mellitus without complications: Secondary | ICD-10-CM | POA: Insufficient documentation

## 2017-10-17 DIAGNOSIS — I700 Atherosclerosis of aorta: Secondary | ICD-10-CM | POA: Diagnosis not present

## 2017-10-17 DIAGNOSIS — I2510 Atherosclerotic heart disease of native coronary artery without angina pectoris: Secondary | ICD-10-CM | POA: Insufficient documentation

## 2017-10-17 DIAGNOSIS — Z87891 Personal history of nicotine dependence: Secondary | ICD-10-CM | POA: Insufficient documentation

## 2017-10-17 DIAGNOSIS — Z8249 Family history of ischemic heart disease and other diseases of the circulatory system: Secondary | ICD-10-CM | POA: Diagnosis not present

## 2017-10-17 DIAGNOSIS — Z791 Long term (current) use of non-steroidal anti-inflammatories (NSAID): Secondary | ICD-10-CM | POA: Insufficient documentation

## 2017-10-17 DIAGNOSIS — Z8489 Family history of other specified conditions: Secondary | ICD-10-CM | POA: Diagnosis not present

## 2017-10-17 DIAGNOSIS — C569 Malignant neoplasm of unspecified ovary: Secondary | ICD-10-CM | POA: Insufficient documentation

## 2017-10-17 DIAGNOSIS — Z882 Allergy status to sulfonamides status: Secondary | ICD-10-CM | POA: Insufficient documentation

## 2017-10-17 DIAGNOSIS — D489 Neoplasm of uncertain behavior, unspecified: Secondary | ICD-10-CM

## 2017-10-17 DIAGNOSIS — D487 Neoplasm of uncertain behavior of other specified sites: Secondary | ICD-10-CM

## 2017-10-17 DIAGNOSIS — Z90721 Acquired absence of ovaries, unilateral: Secondary | ICD-10-CM

## 2017-10-17 DIAGNOSIS — R1011 Right upper quadrant pain: Secondary | ICD-10-CM | POA: Insufficient documentation

## 2017-10-17 DIAGNOSIS — K760 Fatty (change of) liver, not elsewhere classified: Secondary | ICD-10-CM | POA: Diagnosis not present

## 2017-10-17 DIAGNOSIS — Z7984 Long term (current) use of oral hypoglycemic drugs: Secondary | ICD-10-CM | POA: Insufficient documentation

## 2017-10-17 DIAGNOSIS — Z79899 Other long term (current) drug therapy: Secondary | ICD-10-CM | POA: Insufficient documentation

## 2017-10-17 NOTE — Progress Notes (Signed)
Consult Note: Gyn-Onc  Denise Barnett 46 y.o. female  CC:  Chief Complaint  Patient presents with  . Pelvic mass in female    [id number] is seen today for postoperative follow-up, initially referred by Dr. Jerelyn Charles. Consulting physician Dr. Melvyn Novas  Patient is a very pleasant 46 year old gravida 1 para 1 whose next cycle is due soon. Her cycles are fairly regular as she is on birth control pills but she's had a longer cycle last month which lasted 7 days. She has had some intermittent abdominal pain in the right upper quadrant for the past few months. It was hard for her to know that was related to her lungs are other issues. She began having issues with her lungs and was evaluated by Dr. Melvyn Novas as she had right back pain that was worse with deep inspiration. Patient was seen on 03/11/2017 with right lateral chest pain at that time chest x-ray showed small postinfectious pneumatocyst, patient was discharged on Levaquin. She had a follow-up chest x-ray and  CT chest done which showed progression of the opacities visualized on 03/11/2017. She was subsequently admitted and have really been on and off antibiotics as well as steroids and inhalers. She has a history of sleep apnea. Ultimately she had a repeat CT of the chest done, results of which are below.  CT chest 8/18: IMPRESSION: 1. Waxing and waning pattern of multifocal mass-like and nodular areas of apparent airspace consolidation throughout the lungs bilaterally (right greater than left), as detailed above. Overall, this process appears slightly progressive compared to the prior examination. There is associated lymphadenopathy in the mediastinum and upper abdomen. This appearance is highly unusual, and carries a broad differential diagnosis which includes atypical infection, pulmonary vasculitides, and neoplasm such as lymphoma (e.g., MALToma). Tissue sampling of one of these lesions is suggested to establish a tissue diagnosis, if clinically  appropriate. 2. Aortic atherosclerosis, in addition to left anterior descending coronary artery disease. Please note that although the presence of coronary artery calcium documents the presence of coronary arterym disease, the severity of this disease and any potential stenosis cannot be assessed on this non-gated CT examination. Assessment for potential risk factor modification, dietary therapy or pharmacologic therapy may be warranted, if clinically indicated. 3. Hepatic steatosis. CHEST: No hypermetabolic mediastinal, hilar or axillary lymph nodes. There are resolving areas of peribronchovascular consolidation in the lungs bilaterally, improved from 07/21/2017 and without abnormal hypermetabolism above blood pool. Heart is at the upper limits of normal in size to mildly enlarged. No pericardial or pleural effusion.  As a result of the lesions on her chest CT, she subsequently underwent a PET/CT.  PET CT 8/18 ABDOMEN/PELVIS: No abnormal hypermetabolism in the liver, adrenal glands, spleen or pancreas. No hypermetabolic lymph nodes. Liver, gallbladder, adrenal glands, kidneys, spleen, pancreas, stomach and bowel are grossly unremarkable. A 16.9 x 21.7 cm cystic mass is seen in the central anatomic pelvis with soft tissue along the inferior left posterolateral margin (CT image 173). No associated abnormal hypermetabolism. Lesion likely arises from the left ovary. Small to borderline enlarged mesenteric lymph nodes and mesenteric haziness, again, without abnormal hypermetabolism.  SKELETON: No abnormal osseous hypermetabolism.  IMPRESSION: 1. Resolving areas of peribronchovascular pulmonary parenchymal consolidation without abnormal hypermetabolism, indicative of an infectious or inflammatory process. Organizing pneumonia can alsohave this appearance. 2. 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   She was seen by Dr. Carlis Abbott in follow-up  of the mass  noted on CT scan. She underwent an ultrasound on September 7 revealed a cystic mass measuring 19.4 x 15.9 x 20.2 cm with internal echoes. There is no flow. She had a CA-125 of 22.2 and a CEA of 0.8. Her hemoglobin A1c was 6.1. She does have up-to-date Pap smears as well as mammograms. Her mammogram was in May 2018 and was unremarkable. In retrospect she does note that she has had some early satiety for about the past month but has not lost any weight. She had one episode of emesis and she thought that it was related to eating too much. She has always had fairly loose-type stools but more recently she's had increased stools to up to 5 per day. She's not sure of this related to her metformin or not. She does have a family history of cancer in that her paternal grandfather had lung cancer and maternal grandfather had ENT cancer. There is no GYN cancers in the family. She quit smoking 8 years ago. Of note, she is seeing Dr. Melvyn Novas tomorrow in follow-up.  Interval Hx:  On 09/15/17 she underwent ex lap, left salpingo-oophorectomy. Intraoperative frozen found a benign cyst. The remaining uterus and contralateral ovary and tube as well as omentum were normal. 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  Since surgery she is doing well with no issues.  Review of Systems: Constitutional: + early satiety.  Denies fever. Skin: + psoriasis Cardiovascular: No chest pain, shortness of breath Pulmonary: +cough  Gastro Intestinal: Reporting intermittent lower abdominal soreness.  GI symptoms as above Genitourinary: No frequency, urgency, or dysuria.  Denies vaginal bleeding other than with menses.   Current Meds:  Outpatient Encounter Medications as of 10/17/2017  Medication Sig  . Adalimumab (HUMIRA PEN) 40 MG/0.8ML PNKT Humira Pen 40 mg/0.8 mL subcutaneous kit  . clobetasol cream (TEMOVATE) 5.05 % Apply 1 application topically daily as needed (psoriasis).  .  cyclobenzaprine (FLEXERIL) 10 MG tablet Take 1 tablet by mouth daily as needed for muscle spasms.   [name] ibuprofen (ADVIL,MOTRIN) 200 MG tablet Take 600 mg by mouth every 8 (eight) hours as needed for mild pain.  . meloxicam (MOBIC) 15 MG tablet Take 1 tablet by mouth daily as needed for pain.   . metFORMIN (GLUCOPHAGE) 500 MG tablet Take 500 mg by mouth 2 (two) times daily with a meal.   . Norgestimate-Ethinyl Estradiol Triphasic 0.18/0.215/0.25 MG-35 MCG tablet Take 1 tablet by mouth daily.  [name] oxyCODONE-acetaminophen (PERCOCET/ROXICET) 5-325 MG tablet Take 1 tablet by mouth every 6 (six) hours as needed (moderate to severe pain).  . Potassium (POTASSIMIN PO) Take 1 tablet by mouth daily.   No facility-administered encounter medications on file as of 10/17/2017.     Allergy:  Allergies  Allergen Reactions  . Bactrim [Sulfamethoxazole-Trimethoprim] Rash  . Penicillins Rash    Has patient had a PCN reaction causing immediate rash, facial/tongue/throat swelling, SOB or lightheadedness with hypotension: No Has patient had a PCN reaction causing severe rash involving mucus membranes or skin necrosis: Unknown Has patient had a PCN reaction that required hospitalization: No Has patient had a PCN reaction occurring within the last 10 years: No If all of the above answers are "NO", then may proceed with Cephalosporin use.     Social Hx:   Social History   Socioeconomic History  . Marital status: Single    Spouse name: Not on file  . Number of children: 1  . Years of education: 30  . Highest  education level: Not on file  Social Needs  . Financial resource strain: Not on file  . Food insecurity - worry: Not on file  . Food insecurity - inability: Not on file  . Transportation needs - medical: Not on file  . Transportation needs - non-medical: Not on file  Occupational History  . Occupation: Scientific laboratory technician: [employer]  Tobacco Use  . Smoking status: Former Smoker     Packs/day: 0.75    Years: 20.00    Pack years: 15.00    Last attempt to quit: 2010    Years since quitting: 8.8  . Smokeless tobacco: Never Used  Substance and Sexual Activity  . Alcohol use: No  . Drug use: No  . Sexual activity: Yes  Other Topics Concern  . Not on file  Social History Narrative  . Not on file    Past Surgical Hx:  Past Surgical History:  Procedure Laterality Date  . none      Past Medical Hx:  Past Medical History:  Diagnosis Date  . Bronchitis   . Diabetes mellitus without complication ([place])    type I   . Heel spur, left   . Lichen planus   . OSA (obstructive sleep apnea) 12/25/2013   cpap- does not know settings   . Pneumonia    hx of     Oncology Hx:   No history exists.    Family Hx:  Family History  Problem Relation Age of Onset  . Sleep apnea Mother   . Heart attack Brother     Vitals:  Blood pressure 134/69, pulse 86, temperature 98.5 F (36.9 C), temperature source Oral, resp. rate 20, weight 210 lb 6.4 oz (95.4 kg), SpO2 99 %.  Physical Exam: Well-nourished vulva female in no acute distress  Neck: Supple, no lymphadenopathy, no thyromegaly  Lungs: Clear to auscultation bilaterally.  Cardiac: Regular rate and rhythm.  Abdomen: Morbidly obese, soft, nontender, not tympanitic.  It is nontender. Exam is somewhat limited by habitus. Incision is well healed.   Groins: No lymphadenopathy.  Extremities: Psoriasis. Bug bite on left posterior lower leg, no cellulitis. No edema.  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 S/p ex lap, left salpingo-oophorectomy.  I discussed her pathology with her and the small risk for recurrence. We therefore recommend surveillance with annual exams and tumor marker assessment (CA 125 and CEA). Her tumor markers were normal preop.  She will follow-up with Dr Carlis Abbott for ongoing surveillance.  [name], MD 10/17/2017, 2:32 PM

## 2017-10-17 NOTE — Patient Instructions (Signed)
Please return to see Dr Carlis Abbott each year. You should have your tumor markers (CA 125 and CEA) blood tests checked each year with these visits to monitor for recurrence of the borderline tumor of the ovary.

## 2017-10-19 ENCOUNTER — Telehealth: Payer: Self-pay | Admitting: *Deleted

## 2017-10-19 NOTE — Telephone Encounter (Signed)
Returned patient's call and answered her question. Per Denise Barnett is it ok for the patient to start using tampons.

## 2017-10-20 DIAGNOSIS — D279 Benign neoplasm of unspecified ovary: Secondary | ICD-10-CM | POA: Insufficient documentation

## 2018-02-09 ENCOUNTER — Ambulatory Visit: Payer: Managed Care, Other (non HMO) | Admitting: Nurse Practitioner

## 2018-03-13 ENCOUNTER — Other Ambulatory Visit: Payer: Self-pay | Admitting: Family Medicine

## 2018-03-13 DIAGNOSIS — R1905 Periumbilic swelling, mass or lump: Secondary | ICD-10-CM

## 2018-03-15 ENCOUNTER — Ambulatory Visit
Admission: RE | Admit: 2018-03-15 | Discharge: 2018-03-15 | Disposition: A | Discharge status: Home / Self Care | Payer: Managed Care, Other (non HMO) | Source: Ambulatory Visit | Attending: Family Medicine | Admitting: Family Medicine

## 2018-03-15 DIAGNOSIS — R1905 Periumbilic swelling, mass or lump: Secondary | ICD-10-CM

## 2018-03-15 IMAGING — CT CT ABD-PELV W/O CM
1 of 2 series · 13 of 32 positions shown, 18 images · non-contrast
Comparison: [DATE] PET-CT.

CLINICAL DATA: 46 y/o F; right lower quadrant pain and
periumbilical pain. Resection of mucinous borderline tumor.

EXAM:
CT ABDOMEN AND PELVIS WITHOUT CONTRAST
TECHNIQUE: Multidetector CT imaging of the abdomen and pelvis was performed
following the standard protocol without IV contrast.

[Series 2: abd/pelvis w/(date) · axial · 0.91mm/px · z∈[-327,+113]mm · 13 of 98 slices shown, 18 images]
[im 5/98  soft-tissue]
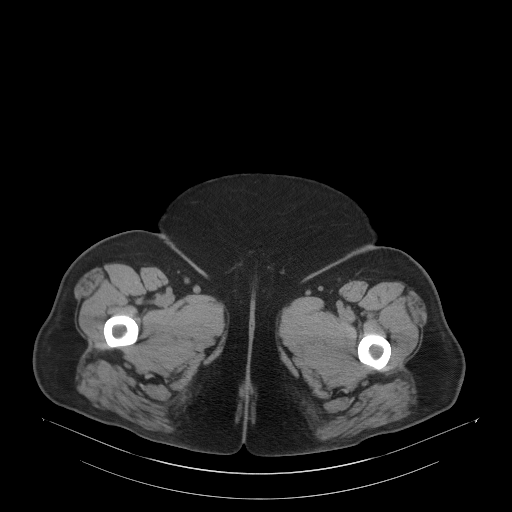
[im 5/98  bone]
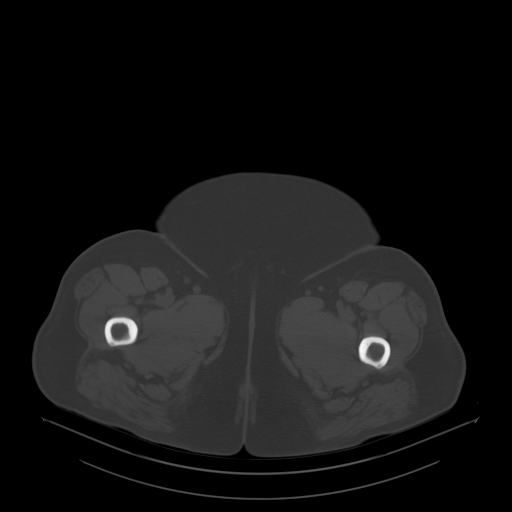
[im 15/98  soft-tissue]
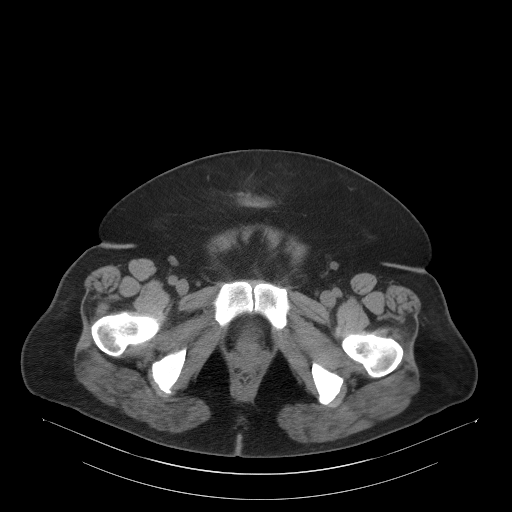
[im 20/98  soft-tissue]
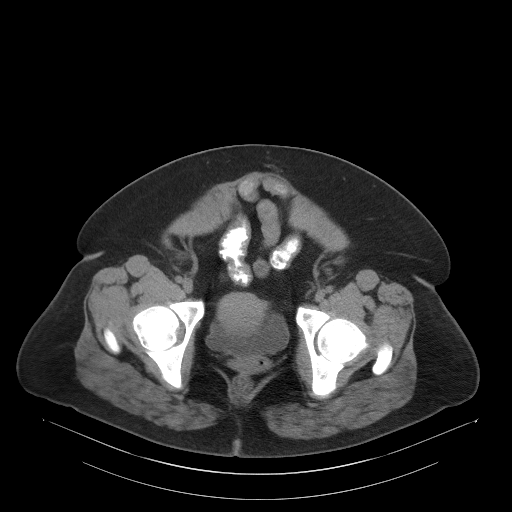
[im 30/98  soft-tissue]
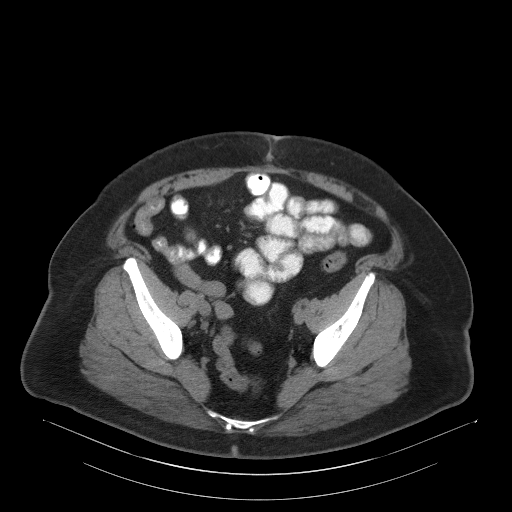
[im 39/98  soft-tissue]
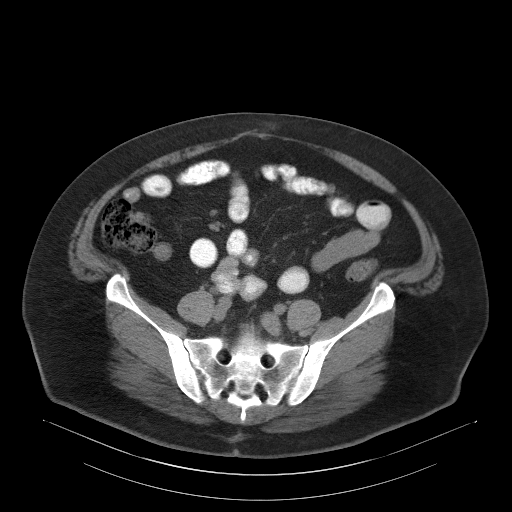
[im 44/98  soft-tissue]
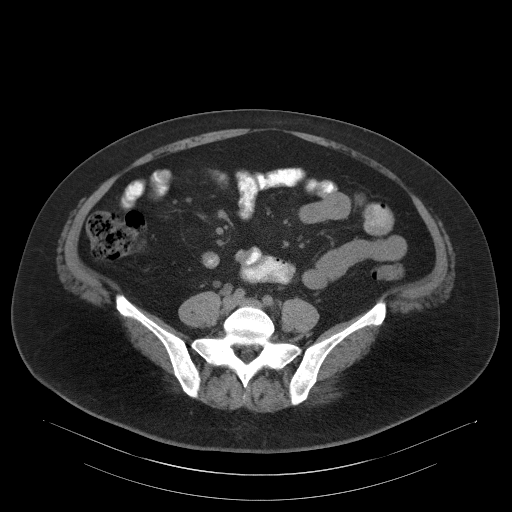
[im 54/98  soft-tissue]
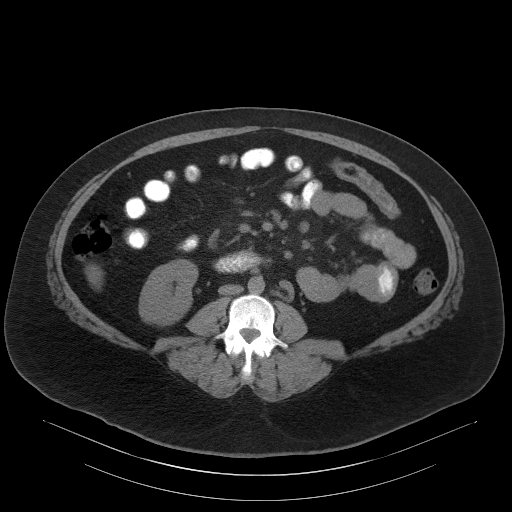
[im 59/98  soft-tissue]
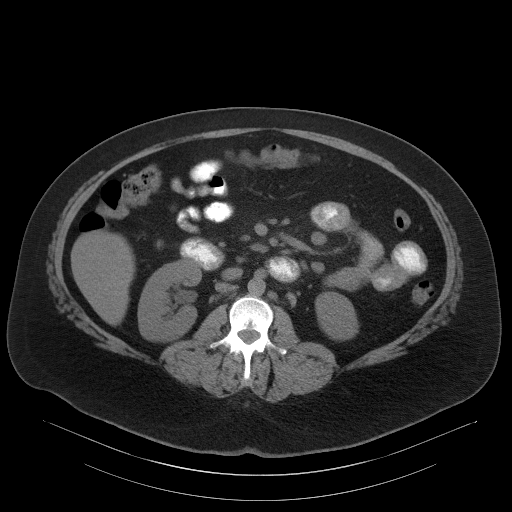
[im 68/98  soft-tissue]
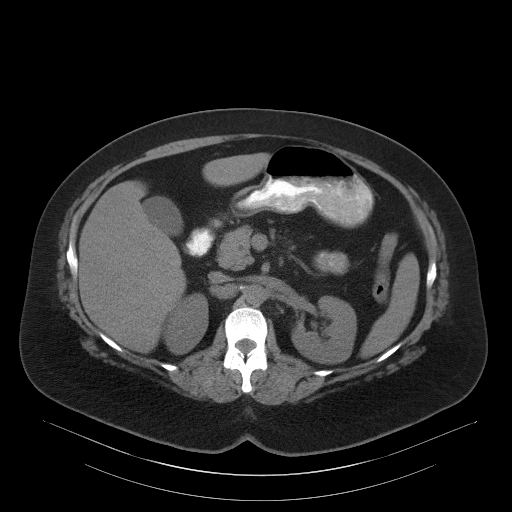
[im 68/98  bone]
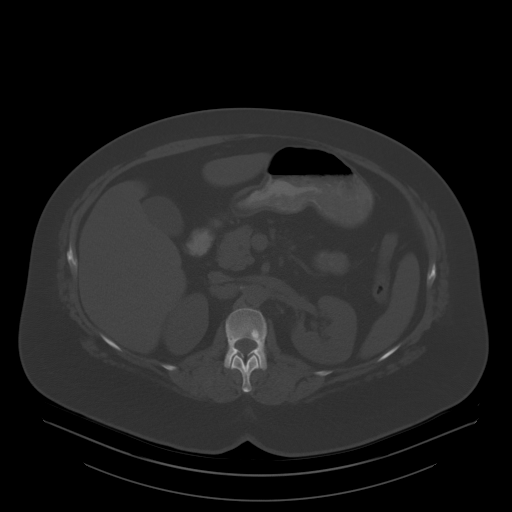
[im 78/98  soft-tissue]
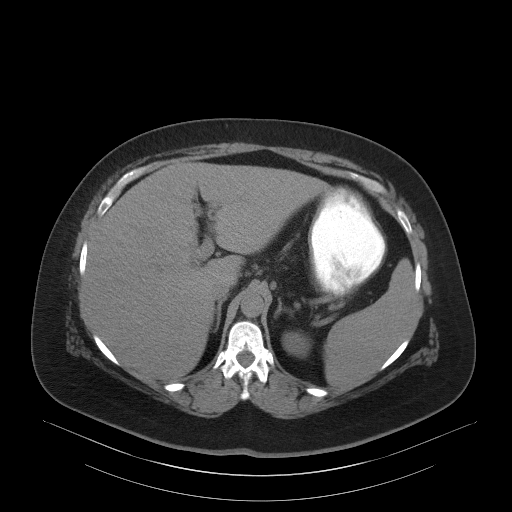
[im 78/98  lung]
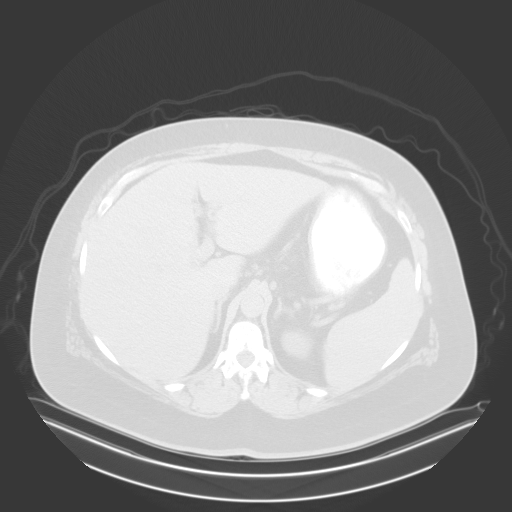
[im 83/98  soft-tissue]
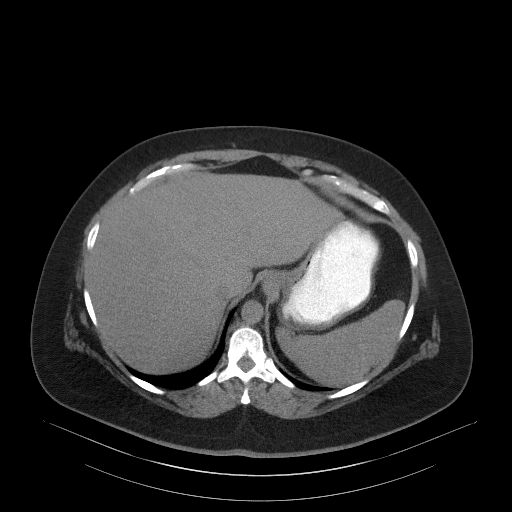
[im 83/98  lung]
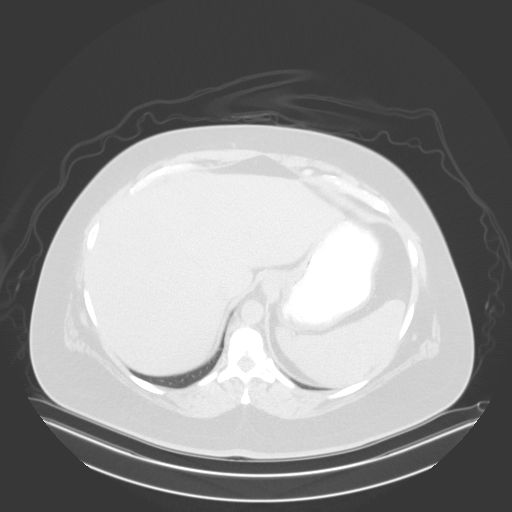
[im 88/98  lung]
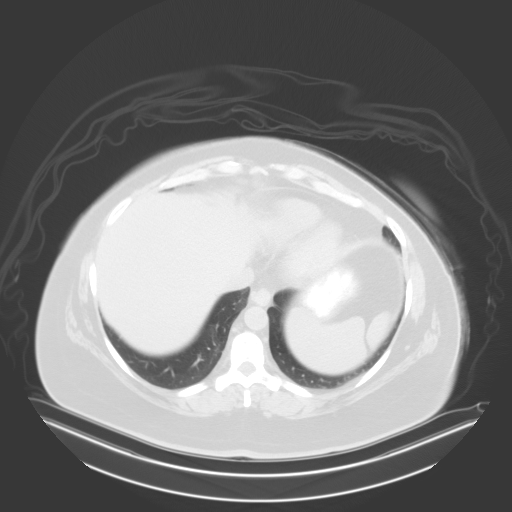
[im 93/98  soft-tissue]
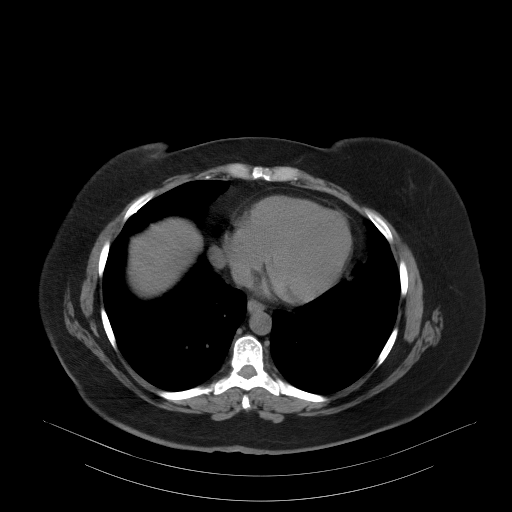
[im 93/98  lung]
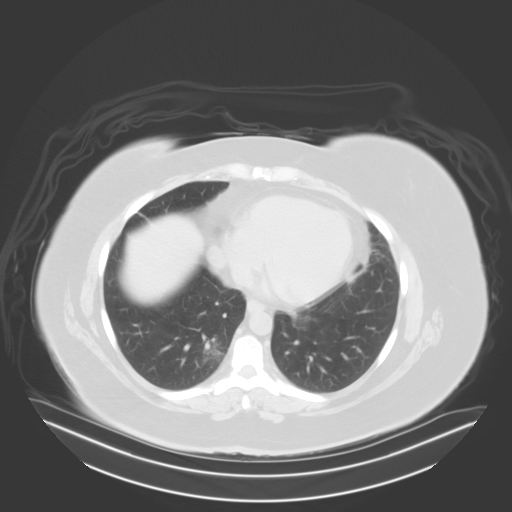

[13 of 32 positions shown; findings below may reference images not displayed]

FINDINGS: Lower chest: Faint residual ground-glass opacity in the lung bases,
improved prior study.

Hepatobiliary: No focal liver abnormality is seen. No gallstones,
gallbladder wall thickening, or biliary dilatation.

Pancreas: Unremarkable. No pancreatic ductal dilatation or
surrounding inflammatory changes.

Spleen: Normal in size without focal abnormality.

Adrenals/Urinary Tract: Adrenal glands are unremarkable. Kidneys are
normal, without renal calculi, focal lesion, or hydronephrosis.
Bladder is unremarkable.

Stomach/Bowel: Stomach is within normal limits. Appendix appears
normal. No evidence of bowel wall thickening, distention, or
inflammatory changes.

Vascular/Lymphatic: Mild stranding of mesenteric fat with
lymphadenopathy measuring up to 13 mm short axis (series 2, image
43). Para-aortic lymphadenopathy is increased best appreciable at
level of renal arteries measuring 14 x 31 mm, previously 10 x 26 mm
(series 2, image 30).

Reproductive: Left oophorectomy. Normal appearance of uterus and
right adnexa.

Other: Postsurgical changes in the lower abdominal wall in the
midline. Lower diastases rectus and broad neck hernia at level of
surgical scar measuring 5.7 x 4.0 cm (ML by CC) with herniation of
peritoneal fat and small bowel, no bowel obstruction.

Musculoskeletal: No acute fracture. Sclerosis within the bilateral
ilium mass sacroiliac joints, probably osteitis condensans iliac.
Stable small sclerotic foci within the T11, T12, and L2 vertebral
bodies.
IMPRESSION: 1. Lower abdominal diastases rectus and broad neck hernia at level
of surgical scar measuring 5.7 x 4.0 cm with herniation of
peritoneal fat and small bowel, no bowel obstruction.
2. Para-aortic lymphadenopathy increased in size from prior study.
Additionally, there is persistent mesenteric fat stranding and
lymphadenopathy. No hypermetabolism on prior PET-CT. Findings of
uncertain significance. Some possible differentials includes
reactive inflammation, systemic infection, connective tissue disease
or autoimmune process, lymphoproliferative disease, third spacing,
or mesenteric panniculitis. Metastasis is unlikely given lack of
hypermetabolism on prior PET-CT and low-grade pathology, but not
excluded. Follow-up recommended.

By: GABRIIEL HILARIO M.D.

## 2018-03-20 ENCOUNTER — Telehealth: Payer: Self-pay | Admitting: Gynecologic Oncology

## 2018-03-20 NOTE — Telephone Encounter (Signed)
Called patient and discussed CT scan.  Advised patient that Dr. Denman George reviewed her scan and advised that borderline tumors do not metastasize to the lymph nodes.  No interventions recommended.  She is to call for any needs.

## 2018-03-27 ENCOUNTER — Ambulatory Visit: Payer: Self-pay | Admitting: General Surgery

## 2018-03-27 NOTE — H&P (Signed)
  History of Present Illness Ralene Ok MD; 03/27/2018 10:43 AM) The patient is a 47 year old female who presents with an incisional hernia. Referred by: Dr. Tonny Bollman Chief Complaint: Incisional hernia  Patient is a 46 year old femal, with a history of diabetes, psoriasis who underwent a exploratory laparoscopy for a left cystic removal in October 2018. Patient states that recently over the last several months she was a bulge and some dull discomfort her lower midline area near the incision site. Patient underwent CT scan which did reveal an incisional hernia. I did review this personally. The hernia measures approximately 5.7 x 4 cm.  Patient states that she's had no other previous surgeries. Her last hemoglobin A1c was 5.66 months ago.    Allergies Sabino Gasser; 03/27/2018 10:26 AM) Penicillins  Allergies Reconciled   Medication History Sabino Gasser; 03/27/2018 10:27 AM) MetFORMIN HCl (500MG  Tablet, Oral) Active. Clobetasol Propionate (0.05% Ointment, External) Active. Tri-Sprintec (0.18/0.215/0.25MG -35 MCG Tablet, Oral) Active. Otezla (30MG  Tablet, Oral) Active. Celecoxib (200MG  Capsule, Oral) Active. Cyclobenzaprine HCl (10MG  Tablet, Oral) Active. Naproxen (500MG  Tablet, Oral) Active. Medications Reconciled    Review of Systems Ralene Ok, MD; 03/27/2018 10:45 AM) All other systems negative  Vitals Sabino Gasser; 03/27/2018 10:28 AM) 03/27/2018 10:27 AM Weight: 213 lb Height: 64in Body Surface Area: 2.01 m Body Mass Index: 36.56 kg/m  Temp.: 98.36F(Oral)  Pulse: 100 (Regular)  BP: 132/84 (Sitting, Left Arm, Standard)       Physical Exam Ralene Ok MD; 03/27/2018 10:44 AM) The physical exam findings are as follows: Note:Constitutional: No acute distress, conversant, appears stated age  Eyes: Anicteric sclerae, moist conjunctiva, no lid lag  Neck: No thyromegaly, trachea midline, no cervical lymphadenopathy  Lungs:  Clear to auscultation biilaterally, normal respiratory effot  Cardiovascular: regular rate & rhythm, no murmurs, no peripheal edema, pedal pulses 2+  GI: Soft, no masses or hepatosplenomegaly, non-tender to palpation  MSK: Normal gait, no clubbing cyanosis, edema  Skin: No rashes, palpation reveals normal skin turgor  Psychiatric: Appropriate judgment and insight, oriented to person, place, and time  Abdomen Inspection Hernias - Incisional - Reducible(Palpable hernia, approximately 5 x 4 cm just inferior to the umbilicus, midline).    Assessment & Plan Ralene Ok MD; 03/27/2018 10:45 AM) Fatima Blank HERNIA, WITHOUT OBSTRUCTION OR GANGRENE (K43.2) Impression: 47 year old female with an incisional hernia, history of diabetes, psoriasis.  1. The patient will like to proceed to the operating room for laparoscopic incisional hernia repair with mesh.  2. I discussed with the patient the signs and symptoms of incarceration and strangulation and the need to proceed to the ER should they occur.  3. I discussed with the patient the risks and benefits of the procedure to include but not limited to: Infection, bleeding, damage to surrounding structures, possible need for further surgery, possible nerve pain, and possible recurrence. The patient was understanding and wishes to proceed.

## 2018-05-03 NOTE — Pre-Procedure Instructions (Addendum)
Marline CHARDE MACFARLANE  05/03/2018      [facility] [place], [place] [address] [address] Phone: [phone] Fax: [phone]    Your procedure is scheduled on 05/12/2018.  Report to [facility] Admitting at [place].M.  Call this number if you have problems the morning of surgery:  [phone]   Remember:  Nothing to eat or drink after midnight the night before your surgery.    Take these medicines the morning of surgery with A SIP OF WATER: Apemilast [place] Nail) Cyclobenzaprine (Flexeril) - if needed Fexofenadine (Allegra) Norgestimate-Ethinyl Estradiol Triphasic Valacyclovir (Valtrex) - if needed  7 days prior to surgery STOP taking any Celebrex, Aspirin (unless otherwise instructed by your surgeon), Aleve, Naproxen, Ibuprofen, Motrin, Advil, Goody's, BC's, all herbal medications, fish oil, and all vitamins  WHAT DO I DO ABOUT MY DIABETES MEDICATION?  [name] Do not take oral diabetes medicines (pills) the morning of surgery. - Do NOT take your metformin the morning of surgery.   How to Manage Your Diabetes Before and After Surgery  Why is it important to control my blood sugar before and after surgery? . Improving blood sugar levels before and after surgery helps healing and can limit problems. . A way of improving blood sugar control is eating a healthy diet by: o  Eating less sugar and carbohydrates o  Increasing activity/exercise o  Talking with your doctor about reaching your blood sugar goals . High blood sugars (greater than 180 mg/dL) can raise your risk of infections and slow your recovery, so you will need to focus on controlling your diabetes during the weeks before surgery. . Make sure that the doctor who takes care of your diabetes knows about your planned surgery including the date and location.  How do I manage my blood sugar before surgery? . Check your blood sugar at least 4 times a day,  starting 2 days before surgery, to make sure that the level is not too high or low. o Check your blood sugar the morning of your surgery when you wake up and every 2 hours until you get to the Short Stay unit. . If your blood sugar is less than 70 mg/dL, you will need to treat for low blood sugar: o Do not take insulin. o Treat a low blood sugar (less than 70 mg/dL) with  cup of clear juice (cranberry or apple), 4 glucose tablets, OR glucose gel. Recheck blood sugar in 15 minutes after treatment (to make sure it is greater than 70 mg/dL). If your blood sugar is not greater than 70 mg/dL on recheck, call [phone] o  for further instructions. . Report your blood sugar to the short stay nurse when you get to Short Stay.  . If you are admitted to the hospital after surgery: o Your blood sugar will be checked by the staff and you will probably be given insulin after surgery (instead of oral diabetes medicines) to make sure you have good blood sugar levels. o The goal for blood sugar control after surgery is 80-180 mg/dL.      Do not wear jewelry, make-up or nail polish.  Do not wear lotions, powders, or perfumes, or deodorant.  Do not shave 48 hours prior to surgery.    Do not bring valuables to the hospital.  [facility] is not responsible for any belongings or valuables.  Hearing aids, eyeglasses, contacts, dentures or  bridgework may not be worn into surgery.  Leave your suitcase in the car.  After surgery it may be brought to your room.  For patients admitted to the hospital, discharge time will be determined by your treatment team.  Patients discharged the day of surgery will not be allowed to drive home.   Name and phone number of your driver:    Special instructions:   [place]- Preparing For Surgery  Before surgery, you can play an important role. Because skin is not sterile, your skin needs to be as free of germs as possible. You can reduce the number of germs on your skin  by washing with CHG (chlorahexidine gluconate) Soap before surgery.  CHG is an antiseptic cleaner which kills germs and bonds with the skin to continue killing germs even after washing.    Oral Hygiene is also important to reduce your risk of infection.  Remember - BRUSH YOUR TEETH THE MORNING OF SURGERY WITH YOUR REGULAR TOOTHPASTE  Please do not use if you have an allergy to CHG or antibacterial soaps. If your skin becomes reddened/irritated stop using the CHG.  Do not shave (including legs and underarms) for at least 48 hours prior to first CHG shower. It is OK to shave your face.  Please follow these instructions carefully.   1. Shower the NIGHT BEFORE SURGERY and the MORNING OF SURGERY with CHG.   2. If you chose to wash your hair, wash your hair first as usual with your normal shampoo.  3. After you shampoo, rinse your hair and body thoroughly to remove the shampoo.  4. Use CHG as you would any other liquid soap. You can apply CHG directly to the skin and wash gently with a scrungie or a clean washcloth.   5. Apply the CHG Soap to your body ONLY FROM THE NECK DOWN.  Do not use on open wounds or open sores. Avoid contact with your eyes, ears, mouth and genitals (private parts). Wash Face and genitals (private parts)  with your normal soap.  6. Wash thoroughly, paying special attention to the area where your surgery will be performed.  7. Thoroughly rinse your body with warm water from the neck down.  8. DO NOT shower/wash with your normal soap after using and rinsing off the CHG Soap.  9. Pat yourself dry with a CLEAN TOWEL.  10. Wear CLEAN PAJAMAS to bed the night before surgery, wear comfortable clothes the morning of surgery  11. Place CLEAN SHEETS on your bed the night of your first shower and DO NOT SLEEP WITH PETS.    Day of Surgery:  Do not apply any deodorants/lotions.  Please wear clean clothes to the hospital/surgery center.   Remember to brush your teeth WITH YOUR  REGULAR TOOTHPASTE.    Please read over the following fact sheets that you were given.

## 2018-05-04 ENCOUNTER — Other Ambulatory Visit: Payer: Self-pay

## 2018-05-04 ENCOUNTER — Encounter (HOSPITAL_COMMUNITY)
Admission: RE | Admit: 2018-05-04 | Discharge: 2018-05-04 | Disposition: A | Discharge status: Home / Self Care | Payer: Managed Care, Other (non HMO) | Source: Ambulatory Visit | Attending: General Surgery | Admitting: General Surgery

## 2018-05-04 ENCOUNTER — Encounter (HOSPITAL_COMMUNITY): Payer: Self-pay

## 2018-05-04 DIAGNOSIS — Z8701 Personal history of pneumonia (recurrent): Secondary | ICD-10-CM | POA: Insufficient documentation

## 2018-05-04 DIAGNOSIS — G4733 Obstructive sleep apnea (adult) (pediatric): Secondary | ICD-10-CM | POA: Insufficient documentation

## 2018-05-04 DIAGNOSIS — K432 Incisional hernia without obstruction or gangrene: Secondary | ICD-10-CM | POA: Insufficient documentation

## 2018-05-04 DIAGNOSIS — Z01812 Encounter for preprocedural laboratory examination: Secondary | ICD-10-CM | POA: Diagnosis present

## 2018-05-04 DIAGNOSIS — E119 Type 2 diabetes mellitus without complications: Secondary | ICD-10-CM | POA: Insufficient documentation

## 2018-05-04 HISTORY — DX: Incisional hernia without obstruction or gangrene: K43.2

## 2018-05-04 LAB — CBC
HCT: 38.6 % (ref 36.0–46.0)
Hemoglobin: 12.6 g/dL (ref 12.0–15.0)
MCH: 25.8 pg — AB (ref 26.0–34.0)
MCHC: 32.6 g/dL (ref 30.0–36.0)
MCV: 78.9 fL (ref 78.0–100.0)
PLATELETS: 409 10*3/uL — AB (ref 150–400)
RBC: 4.89 MIL/uL (ref 3.87–5.11)
RDW: 13.3 % (ref 11.5–15.5)
WBC: 16.9 10*3/uL — AB (ref 4.0–10.5)

## 2018-05-04 LAB — BASIC METABOLIC PANEL
ANION GAP: 10 (ref 5–15)
BUN: 15 mg/dL (ref 6–20)
CALCIUM: 8.6 mg/dL — AB (ref 8.9–10.3)
CO2: 22 mmol/L (ref 22–32)
Chloride: 103 mmol/L (ref 101–111)
Creatinine, Ser: 0.59 mg/dL (ref 0.44–1.00)
GFR calc Af Amer: >60 mL/min (ref >60)
GLUCOSE: 157 mg/dL — AB (ref 65–99)
Potassium: 4.3 mmol/L (ref 3.5–5.1)
Sodium: 135 mmol/L (ref 135–145)

## 2018-05-04 LAB — HEMOGLOBIN A1C
Hgb A1c MFr Bld: 6.8 % — ABNORMAL HIGH (ref 4.8–5.6)
Mean Plasma Glucose: 148.46 mg/dL

## 2018-05-04 NOTE — Progress Notes (Signed)
PCP - Dr. Dorthy Cooler- [place]  [facility]m- [place]  Cardiologist - Denies  Chest x-ray - 09/01/17 (E)  EKG - 09/12/17 (E)  Stress Test - Denies  ECHO - 04/16/17 (E)  Cardiac Cath - Denies  Sleep Study - Yes- Positive CPAP - Yes  LABS- 05/04/18: CBC, BMP 05/12/18: POC Upreg  ASA- Denies  HA1C- 05/04/18 Fasting Blood Sugar - in Epic Checks Blood Sugar ___0__ times a day  Anesthesia-No  Pt denies having chest pain, sob, or fever at this time. All instructions explained to the pt, with a verbal understanding of the material. Pt agrees to go over the instructions while at home for a better understanding. The opportunity to ask questions was provided.

## 2018-05-04 NOTE — Progress Notes (Signed)
Denise Barnett from Dr. Johney Frame office made aware of pt's WBC 16.9.

## 2018-05-05 NOTE — Progress Notes (Addendum)
Anesthesia Chart Review:   Case:  [id number] Date/Time:  05/29/18 1100   Procedures:      LAPAROSCOPIC INCISIONAL HERNIA (N/A )     INSERTION OF MESH (N/A )   Anesthesia type:  General   Pre-op diagnosis:  Incisional hernia   Location:  [facility] OR ROOM 02 / [place] OR   Surgeon:  Ralene Ok, MD     Addendum 05/18/18:  - Pt was evaluated by PCP for elevated WBC count 05/01/18.  Determined to be prednisone induced.  No signs/sx infection.    DISCUSSION: - Pt is a 47 year old female with hx DM, OSA  - WBC count 16.9 at pre-admission testing.  No fever.  No acute signs/sx illness documented at pre-admission testing.  Abigail Butts in Dr. Ramirez's office notified of elevated WBC count  - I spoke with pt by telephone.  She has some sort of insect bite infection of her skin. Pt's surgery was originally scheduled for 05/12/18 but has been moved to 05/29/18 due to elevated WBC count/infection.    VS: BP (!) 144/63   Pulse 70   Temp 36.7 C   Resp 20   Ht 5\' 3"  (1.6 m)   Wt 213 lb (96.6 kg)   LMP 04/10/2018   SpO2 97%   BMI 37.73 kg/m    PROVIDERS: - PCP is Koirala, Dibas, MD.    LABS:  - WBC count 16.9  (all labs ordered are listed, but only abnormal results are displayed)  Labs Reviewed  BASIC METABOLIC PANEL - Abnormal; Notable for the following components:      Result Value   Glucose, Bld 157 (*)    Calcium 8.6 (*)    All other components within normal limits  HEMOGLOBIN A1C - Abnormal; Notable for the following components:   Hgb A1c MFr Bld 6.8 (*)    All other components within normal limits  CBC - Abnormal; Notable for the following components:   WBC 16.9 (*)    MCH 25.8 (*)    Platelets 409 (*)    All other components within normal limits     IMAGES:  CT abd pelvis 03/15/18:  1. Lower abdominal diastases rectus and broad neck hernia at level of surgical scar measuring 5.7 x 4.0 cm with herniation of peritoneal fat and small bowel, no bowel obstruction. 2. Para-aortic  lymphadenopathy increased in size from prior study. Additionally, there is persistent mesenteric fat stranding and lymphadenopathy. No hypermetabolism on prior PET-CT. Findings of uncertain significance. Some possible differentials includes reactive inflammation, systemic infection, connective tissue disease or autoimmune process, lymphoproliferative disease, third spacing, or mesenteric panniculitis. Metastasis is unlikely given lack of hypermetabolism on prior PET-CT and low-grade pathology, but not excluded. Follow-up recommended.  CXR 09/01/17: No active cardiopulmonary disease is seen by chest x-ray   EKG:   CV:  Past Medical History:  Diagnosis Date  . Bronchitis   . Diabetes mellitus without complication ([place])    Type II  . Heel spur, left   . Incisional hernia   . Lichen planus   . OSA (obstructive sleep apnea) 12/25/2013   cpap- does not know settings   . Pneumonia    hx of     Past Surgical History:  Procedure Laterality Date  . BILATERAL SALPINGECTOMY Left 09/15/2017   Procedure: LEFT SALPINGECTOMY;  Surgeon: Everitt Amber, MD;  Location: [facility] ORS;  Service: Gynecology;  Laterality: Left;  . LAPAROTOMY Bilateral 09/15/2017   Procedure: EXPLORATORY LAPAROTOMY;  Surgeon: Everitt Amber, MD;  Location: [facility] ORS;  Service: Gynecology;  Laterality: Bilateral;  . none    . OOPHORECTOMY Left 09/15/2017   Procedure: LEFT OOPHORECTOMY;  Surgeon: Everitt Amber, MD;  Location: [facility] ORS;  Service: Gynecology;  Laterality: Left;  [name] VIDEO BRONCHOSCOPY Bilateral 04/18/2017   Procedure: VIDEO BRONCHOSCOPY WITHOUT FLUORO;  Surgeon: Rush Farmer, MD;  Location: [facility] ENDOSCOPY;  Service: Endoscopy;  Laterality: Bilateral;    MEDICATIONS: . Apremilast 30 MG TABS  . celecoxib (CELEBREX) 200 MG capsule  . clobetasol cream (TEMOVATE) 0.05 %  . cyclobenzaprine (FLEXERIL) 10 MG tablet  . doxycycline (VIBRA-TABS) 100 MG tablet  . fexofenadine (ALLEGRA) 180 MG tablet  . ibuprofen (ADVIL,MOTRIN) 200 MG  tablet  . metFORMIN (GLUCOPHAGE) 500 MG tablet  . Norgestimate-Ethinyl Estradiol Triphasic 0.18/0.215/0.25 MG-35 MCG tablet  . predniSONE (STERAPRED UNI-PAK 21 TAB) 10 MG (21) TBPK tablet  . valACYclovir (VALTREX) 500 MG tablet   No current facility-administered medications for this encounter.     If no changes, I anticipate pt can proceed with surgery as scheduled.   Willeen Cass, FNP-BC [facility] Short Stay Surgical Center/Anesthesiology Phone: [phone] 05/18/2018 4:27 PM

## 2018-05-26 ENCOUNTER — Other Ambulatory Visit: Payer: Self-pay

## 2018-05-26 ENCOUNTER — Encounter (HOSPITAL_COMMUNITY): Payer: Self-pay | Admitting: *Deleted

## 2018-05-26 NOTE — Progress Notes (Signed)
Pt denies SOB, chest pain, and being under the care of a cardiologist. Pt denies having a stress test and cardiac cath. Pt made aware to stop taking Aspirin,vitamins, fish oil, and herbal medications. Do not take any NSAIDs ie: Ibuprofen, Advil, Naproxen (Aleve), Motrin, Celebrex, BC and Goody Powder. Pt made aware to not take Metformin on DOS Pt made aware to check BG every 2 hours prior to arrival to hospital on DOS. Pt made aware to treat a BG < 70 with 4 ounces of apple or cranberry juice, wait 15 minutes after intervention to recheck BG, if BG remains < 70, call Short Stay unit to speak with a nurse. Requested recent labs and LOV note from PCP, Denise Barnett at [facility] in [place]. Pt verbalized understanding of all pre-op instructions.

## 2018-05-29 ENCOUNTER — Ambulatory Visit (HOSPITAL_COMMUNITY)
Admission: RE | Admit: 2018-05-29 | Discharge: 2018-05-29 | Disposition: A | Discharge status: Home / Self Care | Payer: Managed Care, Other (non HMO) | Source: Ambulatory Visit | Attending: General Surgery | Admitting: General Surgery

## 2018-05-29 ENCOUNTER — Ambulatory Visit (HOSPITAL_COMMUNITY): Payer: Managed Care, Other (non HMO) | Admitting: Certified Registered"

## 2018-05-29 ENCOUNTER — Encounter (HOSPITAL_COMMUNITY): Payer: Self-pay

## 2018-05-29 ENCOUNTER — Encounter (HOSPITAL_COMMUNITY)
Admission: RE | Disposition: A | Discharge status: Home / Self Care | Payer: Self-pay | Source: Ambulatory Visit | Attending: General Surgery

## 2018-05-29 ENCOUNTER — Ambulatory Visit (HOSPITAL_COMMUNITY): Payer: Managed Care, Other (non HMO) | Admitting: Emergency Medicine

## 2018-05-29 ENCOUNTER — Other Ambulatory Visit: Payer: Self-pay

## 2018-05-29 DIAGNOSIS — K432 Incisional hernia without obstruction or gangrene: Secondary | ICD-10-CM | POA: Insufficient documentation

## 2018-05-29 DIAGNOSIS — L409 Psoriasis, unspecified: Secondary | ICD-10-CM | POA: Diagnosis not present

## 2018-05-29 DIAGNOSIS — Z87891 Personal history of nicotine dependence: Secondary | ICD-10-CM | POA: Insufficient documentation

## 2018-05-29 DIAGNOSIS — E119 Type 2 diabetes mellitus without complications: Secondary | ICD-10-CM | POA: Insufficient documentation

## 2018-05-29 DIAGNOSIS — Z6837 Body mass index (BMI) 37.0-37.9, adult: Secondary | ICD-10-CM | POA: Insufficient documentation

## 2018-05-29 DIAGNOSIS — G4730 Sleep apnea, unspecified: Secondary | ICD-10-CM | POA: Insufficient documentation

## 2018-05-29 DIAGNOSIS — Z7901 Long term (current) use of anticoagulants: Secondary | ICD-10-CM | POA: Diagnosis not present

## 2018-05-29 DIAGNOSIS — Z79899 Other long term (current) drug therapy: Secondary | ICD-10-CM | POA: Diagnosis not present

## 2018-05-29 DIAGNOSIS — Z9989 Dependence on other enabling machines and devices: Secondary | ICD-10-CM | POA: Insufficient documentation

## 2018-05-29 DIAGNOSIS — Z7984 Long term (current) use of oral hypoglycemic drugs: Secondary | ICD-10-CM | POA: Diagnosis not present

## 2018-05-29 HISTORY — PX: INSERTION OF MESH: SHX5868

## 2018-05-29 HISTORY — PX: INCISIONAL HERNIA REPAIR: SHX193

## 2018-05-29 LAB — POCT PREGNANCY, URINE: PREG TEST UR: NEGATIVE

## 2018-05-29 LAB — BASIC METABOLIC PANEL
Anion gap: 10 (ref 5–15)
BUN: 9 mg/dL (ref 6–20)
CHLORIDE: 104 mmol/L (ref 101–111)
CO2: 22 mmol/L (ref 22–32)
CREATININE: 0.6 mg/dL (ref 0.44–1.00)
Calcium: 8.9 mg/dL (ref 8.9–10.3)
GFR calc Af Amer: >60 mL/min (ref >60)
GFR calc non Af Amer: >60 mL/min (ref >60)
GLUCOSE: 139 mg/dL — AB (ref 65–99)
Potassium: 4.1 mmol/L (ref 3.5–5.1)
SODIUM: 136 mmol/L (ref 135–145)

## 2018-05-29 LAB — GLUCOSE, CAPILLARY
Glucose-Capillary: 145 mg/dL — ABNORMAL HIGH (ref 65–99)
Glucose-Capillary: 177 mg/dL — ABNORMAL HIGH (ref 65–99)

## 2018-05-29 SURGERY — REPAIR, HERNIA, INCISIONAL, LAPAROSCOPIC
Anesthesia: General | Site: Abdomen

## 2018-05-29 MED ORDER — FENTANYL CITRATE (PF) 100 MCG/2ML IJ SOLN
INTRAMUSCULAR | Status: AC
  Filled 2018-05-29: qty 2

## 2018-05-29 MED ORDER — ROCURONIUM BROMIDE 100 MG/10ML IV SOLN
INTRAVENOUS | Status: DC | PRN
  Administered 2018-05-29: 10 mg via INTRAVENOUS
  Administered 2018-05-29: 40 mg via INTRAVENOUS
  Administered 2018-05-29: 20 mg via INTRAVENOUS

## 2018-05-29 MED ORDER — MIDAZOLAM HCL 2 MG/2ML IJ SOLN
INTRAMUSCULAR | Status: AC
Start: 2018-05-29 — End: ?
  Filled 2018-05-29: qty 2

## 2018-05-29 MED ORDER — BUPIVACAINE HCL 0.25 % IJ SOLN
INTRAMUSCULAR | Status: DC | PRN
  Administered 2018-05-29: 8 mL

## 2018-05-29 MED ORDER — ROCURONIUM BROMIDE 50 MG/5ML IV SOLN
INTRAVENOUS | Status: AC
  Filled 2018-05-29: qty 2

## 2018-05-29 MED ORDER — LIDOCAINE 2% (20 MG/ML) 5 ML SYRINGE
INTRAMUSCULAR | Status: AC
  Filled 2018-05-29: qty 5

## 2018-05-29 MED ORDER — PROPOFOL 10 MG/ML IV BOLUS
INTRAVENOUS | Status: DC | PRN
  Administered 2018-05-29: 50 mg via INTRAVENOUS
  Administered 2018-05-29: 150 mg via INTRAVENOUS

## 2018-05-29 MED ORDER — ACETAMINOPHEN 500 MG PO TABS
1000.0000 mg | ORAL_TABLET | ORAL | Status: AC
  Administered 2018-05-29: 1000 mg via ORAL
  Filled 2018-05-29: qty 2

## 2018-05-29 MED ORDER — CEFAZOLIN SODIUM-DEXTROSE 2-4 GM/100ML-% IV SOLN
2.0000 g | INTRAVENOUS | Status: AC
  Administered 2018-05-29: 2 g via INTRAVENOUS
  Filled 2018-05-29: qty 100

## 2018-05-29 MED ORDER — LIDOCAINE HCL (CARDIAC) PF 100 MG/5ML IV SOSY
PREFILLED_SYRINGE | INTRAVENOUS | Status: DC | PRN
  Administered 2018-05-29: 100 mg via INTRAVENOUS

## 2018-05-29 MED ORDER — BUPIVACAINE HCL (PF) 0.25 % IJ SOLN
INTRAMUSCULAR | Status: AC
  Filled 2018-05-29: qty 30

## 2018-05-29 MED ORDER — SUGAMMADEX SODIUM 200 MG/2ML IV SOLN
INTRAVENOUS | Status: AC
  Filled 2018-05-29: qty 2

## 2018-05-29 MED ORDER — KETOROLAC TROMETHAMINE 30 MG/ML IJ SOLN
30.0000 mg | Freq: Once | INTRAMUSCULAR | Status: AC | PRN
  Administered 2018-05-29: 30 mg via INTRAVENOUS

## 2018-05-29 MED ORDER — ONDANSETRON HCL 4 MG/2ML IJ SOLN
INTRAMUSCULAR | Status: DC | PRN
  Administered 2018-05-29: 4 mg via INTRAVENOUS

## 2018-05-29 MED ORDER — CHLORHEXIDINE GLUCONATE CLOTH 2 % EX PADS: 6.0000 | MEDICATED_PAD | Freq: Once | CUTANEOUS | Status: DC

## 2018-05-29 MED ORDER — MIDAZOLAM HCL 5 MG/5ML IJ SOLN
INTRAMUSCULAR | Status: DC | PRN
  Administered 2018-05-29: 2 mg via INTRAVENOUS

## 2018-05-29 MED ORDER — OXYCODONE-ACETAMINOPHEN 5-325 MG PO TABS
ORAL_TABLET | ORAL | Status: AC
  Filled 2018-05-29: qty 1

## 2018-05-29 MED ORDER — SCOPOLAMINE 1 MG/3DAYS TD PT72
MEDICATED_PATCH | TRANSDERMAL | Status: AC
  Filled 2018-05-29: qty 1

## 2018-05-29 MED ORDER — OXYCODONE HCL 5 MG/5ML PO SOLN: 5.0000 mg | Freq: Once | ORAL | Status: AC | PRN

## 2018-05-29 MED ORDER — ACETAMINOPHEN 160 MG/5ML PO SOLN: 325.0000 mg | ORAL | Status: DC | PRN

## 2018-05-29 MED ORDER — SUGAMMADEX SODIUM 500 MG/5ML IV SOLN
INTRAVENOUS | Status: AC
  Filled 2018-05-29: qty 5

## 2018-05-29 MED ORDER — ONDANSETRON HCL 4 MG/2ML IJ SOLN
INTRAMUSCULAR | Status: AC
  Filled 2018-05-29: qty 2

## 2018-05-29 MED ORDER — SUGAMMADEX SODIUM 500 MG/5ML IV SOLN
INTRAVENOUS | Status: DC | PRN
  Administered 2018-05-29: 400 mg via INTRAVENOUS

## 2018-05-29 MED ORDER — FENTANYL CITRATE (PF) 250 MCG/5ML IJ SOLN
INTRAMUSCULAR | Status: AC
  Filled 2018-05-29: qty 5

## 2018-05-29 MED ORDER — ACETAMINOPHEN 325 MG PO TABS: 325.0000 mg | ORAL_TABLET | ORAL | Status: DC | PRN

## 2018-05-29 MED ORDER — ESMOLOL HCL 100 MG/10ML IV SOLN
INTRAVENOUS | Status: DC | PRN
  Administered 2018-05-29: 30 mg via INTRAVENOUS
  Administered 2018-05-29 (×2): 20 mg via INTRAVENOUS
  Administered 2018-05-29: 30 mg via INTRAVENOUS

## 2018-05-29 MED ORDER — PROPOFOL 10 MG/ML IV BOLUS
INTRAVENOUS | Status: AC
  Filled 2018-05-29: qty 20

## 2018-05-29 MED ORDER — SCOPOLAMINE 1 MG/3DAYS TD PT72
MEDICATED_PATCH | TRANSDERMAL | Status: DC | PRN
  Administered 2018-05-29: 1 via TRANSDERMAL

## 2018-05-29 MED ORDER — GABAPENTIN 300 MG PO CAPS
300.0000 mg | ORAL_CAPSULE | ORAL | Status: AC
  Administered 2018-05-29: 300 mg via ORAL
  Filled 2018-05-29: qty 1

## 2018-05-29 MED ORDER — OXYCODONE-ACETAMINOPHEN 5-325 MG PO TABS
1.0000 | ORAL_TABLET | ORAL | Status: AC
  Administered 2018-05-29: 1 via ORAL

## 2018-05-29 MED ORDER — OXYCODONE HCL 5 MG PO TABS
ORAL_TABLET | ORAL | Status: AC
  Filled 2018-05-29: qty 1

## 2018-05-29 MED ORDER — FENTANYL CITRATE (PF) 100 MCG/2ML IJ SOLN
25.0000 ug | INTRAMUSCULAR | Status: DC | PRN
  Administered 2018-05-29 (×2): 50 ug via INTRAVENOUS
  Administered 2018-05-29: 25 ug via INTRAVENOUS

## 2018-05-29 MED ORDER — FENTANYL CITRATE (PF) 100 MCG/2ML IJ SOLN
INTRAMUSCULAR | Status: DC | PRN
  Administered 2018-05-29: 50 ug via INTRAVENOUS
  Administered 2018-05-29: 150 ug via INTRAVENOUS
  Administered 2018-05-29: 50 ug via INTRAVENOUS
  Administered 2018-05-29: 100 ug via INTRAVENOUS
  Administered 2018-05-29: 50 ug via INTRAVENOUS
  Administered 2018-05-29: 100 ug via INTRAVENOUS

## 2018-05-29 MED ORDER — MEPERIDINE HCL 50 MG/ML IJ SOLN: 6.2500 mg | INTRAMUSCULAR | Status: DC | PRN

## 2018-05-29 MED ORDER — OXYCODONE-ACETAMINOPHEN 5-325 MG PO TABS: 1.0000 | ORAL_TABLET | ORAL | 0 refills | Status: DC | PRN

## 2018-05-29 MED ORDER — OXYCODONE HCL 5 MG PO TABS
5.0000 mg | ORAL_TABLET | Freq: Once | ORAL | Status: AC | PRN
  Administered 2018-05-29: 5 mg via ORAL

## 2018-05-29 MED ORDER — DEXAMETHASONE SODIUM PHOSPHATE 10 MG/ML IJ SOLN
INTRAMUSCULAR | Status: AC
  Filled 2018-05-29: qty 1

## 2018-05-29 MED ORDER — CELECOXIB 200 MG PO CAPS
200.0000 mg | ORAL_CAPSULE | ORAL | Status: AC
  Administered 2018-05-29: 200 mg via ORAL
  Filled 2018-05-29: qty 1

## 2018-05-29 MED ORDER — ROCURONIUM BROMIDE 50 MG/5ML IV SOLN
INTRAVENOUS | Status: AC
  Filled 2018-05-29: qty 1

## 2018-05-29 MED ORDER — LACTATED RINGERS IV SOLN
INTRAVENOUS | Status: DC | PRN
  Administered 2018-05-29: 10:00:00 via INTRAVENOUS

## 2018-05-29 MED ORDER — KETOROLAC TROMETHAMINE 30 MG/ML IJ SOLN
INTRAMUSCULAR | Status: AC
  Filled 2018-05-29: qty 1

## 2018-05-29 MED ORDER — ONDANSETRON HCL 4 MG/2ML IJ SOLN: 4.0000 mg | Freq: Once | INTRAMUSCULAR | Status: DC | PRN

## 2018-05-29 MED ORDER — EPHEDRINE SULFATE 50 MG/ML IJ SOLN
INTRAMUSCULAR | Status: AC
  Filled 2018-05-29: qty 1

## 2018-05-29 SURGICAL SUPPLY — 52 items
ADH SKN CLS APL DERMABOND .7 (GAUZE/BANDAGES/DRESSINGS) ×1
APL SKNCLS STERI-STRIP NONHPOA (GAUZE/BANDAGES/DRESSINGS)
APPLIER CLIP LOGIC TI 5 (MISCELLANEOUS) IMPLANT
APR CLP MED LRG 33X5 (MISCELLANEOUS)
BENZOIN TINCTURE PRP APPL 2/3 (GAUZE/BANDAGES/DRESSINGS) ×1 IMPLANT
BINDER ABDOMINAL 12 ML 46-62 (SOFTGOODS) ×2 IMPLANT
BLADE CLIPPER SURG (BLADE) IMPLANT
CANISTER SUCT 3000ML PPV (MISCELLANEOUS) IMPLANT
CHLORAPREP W/TINT 26ML (MISCELLANEOUS) ×3 IMPLANT
COVER SURGICAL LIGHT HANDLE (MISCELLANEOUS) ×3 IMPLANT
DERMABOND ADVANCED (GAUZE/BANDAGES/DRESSINGS) ×2
DERMABOND ADVANCED .7 DNX12 (GAUZE/BANDAGES/DRESSINGS) IMPLANT
DEVICE RELIATACK FIXATION (MISCELLANEOUS) ×3 IMPLANT
DEVICE SECURE STRAP 25 ABSORB (INSTRUMENTS) ×4 IMPLANT
DRAPE LAPAROSCOPIC ABDOMINAL (DRAPES) ×3 IMPLANT
ELECT REM PT RETURN 9FT ADLT (ELECTROSURGICAL) ×3
ELECTRODE REM PT RTRN 9FT ADLT (ELECTROSURGICAL) ×1 IMPLANT
GAUZE SPONGE 2X2 8PLY STRL LF (GAUZE/BANDAGES/DRESSINGS) ×1 IMPLANT
GLOVE BIO SURGEON STRL SZ7.5 (GLOVE) ×3 IMPLANT
GOWN STRL REUS W/ TWL LRG LVL3 (GOWN DISPOSABLE) ×2 IMPLANT
GOWN STRL REUS W/ TWL XL LVL3 (GOWN DISPOSABLE) ×1 IMPLANT
GOWN STRL REUS W/TWL LRG LVL3 (GOWN DISPOSABLE) ×6
GOWN STRL REUS W/TWL XL LVL3 (GOWN DISPOSABLE) ×3
GRASPER SUT TROCAR 14GX15 (MISCELLANEOUS) ×3 IMPLANT
KIT BASIN OR (CUSTOM PROCEDURE TRAY) ×3 IMPLANT
KIT TURNOVER KIT B (KITS) ×3 IMPLANT
MARKER SKIN DUAL TIP RULER LAB (MISCELLANEOUS) IMPLANT
MESH VENTRALIGHT ST 6X8 (Mesh Specialty) ×3 IMPLANT
MESH VENTRLGHT ELLIPSE 8X6XMFL (Mesh Specialty) IMPLANT
NDL INSUFFLATION 14GA 120MM (NEEDLE) ×1 IMPLANT
NDL SPNL 22GX3.5 QUINCKE BK (NEEDLE) IMPLANT
NEEDLE INSUFFLATION 14GA 120MM (NEEDLE) ×3 IMPLANT
NEEDLE SPNL 22GX3.5 QUINCKE BK (NEEDLE) IMPLANT
NS IRRIG 1000ML POUR BTL (IV SOLUTION) ×3 IMPLANT
PAD ARMBOARD 7.5X6 YLW CONV (MISCELLANEOUS) ×6 IMPLANT
SCISSORS LAP 5X35 DISP (ENDOMECHANICALS) ×3 IMPLANT
SET IRRIG TUBING LAPAROSCOPIC (IRRIGATION / IRRIGATOR) IMPLANT
SHEARS HARMONIC ACE PLUS 36CM (ENDOMECHANICALS) IMPLANT
SLEEVE ENDOPATH XCEL 5M (ENDOMECHANICALS) ×6 IMPLANT
SPONGE GAUZE 2X2 STER 10/PKG (GAUZE/BANDAGES/DRESSINGS) ×2
SUT CHROMIC 2 0 SH (SUTURE) ×3 IMPLANT
SUT MNCRL AB 3-0 PS2 18 (SUTURE) ×3 IMPLANT
SUT NOVA NAB DX-16 0-1 5-0 T12 (SUTURE) IMPLANT
SUT PROLENE 2 0 KS (SUTURE) IMPLANT
TOWEL OR 17X24 6PK STRL BLUE (TOWEL DISPOSABLE) ×3 IMPLANT
TOWEL OR 17X26 10 PK STRL BLUE (TOWEL DISPOSABLE) ×3 IMPLANT
TRAY FOLEY CATH SILVER 16FR (SET/KITS/TRAYS/PACK) IMPLANT
TRAY LAPAROSCOPIC MC (CUSTOM PROCEDURE TRAY) ×3 IMPLANT
TROCAR XCEL NON-BLD 11X100MML (ENDOMECHANICALS) IMPLANT
TROCAR XCEL NON-BLD 5MMX100MML (ENDOMECHANICALS) ×3 IMPLANT
TUBING INSUFFLATION (TUBING) ×3 IMPLANT
WATER STERILE IRR 1000ML POUR (IV SOLUTION) ×3 IMPLANT

## 2018-05-29 NOTE — Op Note (Signed)
05/29/2018  11:33 AM  PATIENT:  Denise Barnett  47 y.o. female  PRE-OPERATIVE DIAGNOSIS:  Incisional hernia  POST-OPERATIVE DIAGNOSIS:  Incisional hernia  PROCEDURE:  Procedure(s): LAPAROSCOPIC INCISIONAL HERNIA (N/A) INSERTION OF MESH (N/A)  SURGEON:  Surgeon(s) and Role:    * Ralene Ok, MD - Primary  ANESTHESIA:   local and general  EBL:  minimal   BLOOD ADMINISTERED:none  DRAINS: none   LOCAL MEDICATIONS USED:  BUPIVICAINE   SPECIMEN:  No Specimen  DISPOSITION OF SPECIMEN:  N/A  COUNTS:  YES  TOURNIQUET:  * No tourniquets in log *  DICTATION: .Dragon Dictation  Details of the procedure:   After the patient was consented patient was taken back to the operating room patient was then placed in supine position bilateral SCDs in place.  The patient was prepped and draped in the usual sterile fashion. After antibiotics were confirmed a timeout was called and all facts were verified. The Veress needle technique was used to insuflate the abdomen at Palmer's point. The abdomen was insufflated to 14 mm mercury. Subsequently a 5 mm trocar was placed a camera inserted there was no injury to any intra-abdominal organs.   There was some thin omental adhesions to the anterior abdominal wall.  These were taken down with blunt and sharp dissection.  There was seen to be an non-incarcerated  Incisional hernia and suprapubic hernia.  A second camera port was in placed into the left lower quadrant.    Once the hernia was cleared away.  0 Novofils were used to reapproximate the fascia in the midline in an interrupted fashion with the use of an Endoclose device.  A Bard Ventralight 15 x 20cm  mesh was inserted into the abdomen.  The mesh was secured circumferentially with am Securestrap tacker in a double crown fashion.  2-0 prolenes were used at 3:00, 9:00, and 12:00 as transfascial sutures using a endoclose device.    The omentum was brought over the area of the mesh. The  pneumoperitoneum was evacuated  & all trocars  were removed. The skin was reapproximated with 4-0  Monocryl sutures in a subcuticular fashion. The skin was dressed with dermadbond.  The patient was taken to the recovery room in stable condition.  Type of repair - primary suture & mesh  Mesh overlap - 4 cm  Placement of mesh -beneath fascia and into peritoneal cavity  PLAN OF CARE: Discharge to home after PACU  PATIENT DISPOSITION:  PACU - hemodynamically stable.   Delay start of Pharmacological VTE agent (>24hrs) due to surgical blood loss or risk of bleeding: not applicable

## 2018-05-29 NOTE — Anesthesia Procedure Notes (Signed)

## 2018-05-29 NOTE — Transfer of Care (Signed)
Immediate Anesthesia Transfer of Care Note  Patient: Denise Barnett  Procedure(s) Performed: LAPAROSCOPIC INCISIONAL HERNIA (N/A Abdomen) INSERTION OF MESH (N/A Abdomen)  Patient Location: PACU  Anesthesia Type:General  Level of Consciousness: awake, alert  and oriented  Airway & Oxygen Therapy: Patient Spontanous Breathing and Patient connected to face mask oxygen  Post-op Assessment: Report given to RN and Post -op Vital signs reviewed and stable  Post vital signs: Reviewed and stable  Last Vitals:  Vitals Value Taken Time  BP 169/95 05/29/2018 11:46 AM  Temp    Pulse 120 05/29/2018 11:47 AM  Resp 13 05/29/2018 11:47 AM  SpO2 94 % 05/29/2018 11:47 AM  Vitals shown include unvalidated device data.  Last Pain:  Vitals:   05/29/18 0910  TempSrc: Oral  PainSc: 0-No pain      Patients Stated Pain Goal: 3 (22/33/61 2244)  Complications: No apparent anesthesia complications

## 2018-05-29 NOTE — Anesthesia Postprocedure Evaluation (Signed)
Anesthesia Post Note  Patient: Denise Barnett  Procedure(s) Performed: LAPAROSCOPIC INCISIONAL HERNIA (N/A Abdomen) INSERTION OF MESH (N/A Abdomen)     Patient location during evaluation: PACU Anesthesia Type: General Level of consciousness: awake Pain management: pain level controlled Vital Signs Assessment: post-procedure vital signs reviewed and stable Respiratory status: spontaneous breathing Cardiovascular status: stable Postop Assessment: no apparent nausea or vomiting Anesthetic complications: no    Last Vitals:  Vitals:   05/29/18 1254 05/29/18 1305  BP:    Pulse: 95 100  Resp:    Temp:    SpO2: 94% 97%    Last Pain:  Vitals:   05/29/18 1158  TempSrc:   PainSc: 6    Pain Goal: Patients Stated Pain Goal: 3 (05/29/18 0910)               Ayuub Penley JR,JOHN Mateo Flow

## 2018-05-29 NOTE — Anesthesia Preprocedure Evaluation (Signed)
Anesthesia Evaluation  Patient identified by MRN, date of birth, ID band Patient awake    Reviewed: Allergy & Precautions, NPO status , Patient's Chart, lab work & pertinent test results  Airway Mallampati: III  TM Distance: >3 FB Neck ROM: Full   Comment: SMALL MOUTH ! Dental no notable dental hx. (+) Teeth Intact   Pulmonary sleep apnea and Continuous Positive Airway Pressure Ventilation , former smoker,    Pulmonary exam normal breath sounds clear to auscultation       Cardiovascular negative cardio ROS Normal cardiovascular exam Rhythm:Regular Rate:Normal     Neuro/Psych negative neurological ROS  negative psych ROS   GI/Hepatic negative GI ROS, Neg liver ROS,   Endo/Other  diabetes, Type 2, Oral Hypoglycemic AgentsMorbid obesity  Renal/GU negative Renal ROS  negative genitourinary   Musculoskeletal negative musculoskeletal ROS (+)   Abdominal (+) + obese,   Peds negative pediatric ROS (+)  Hematology negative hematology ROS (+)   Anesthesia Other Findings Echo 5/18  udy Conclusions  - Left ventricle: The cavity size was normal. Wall thickness was   normal. Systolic function was normal. The estimated ejection   fraction was in the range of 60% to 65%. Wall motion was normal;   there were no regional wall motion abnormalities. Left   ventricular diastolic function parameters were normal. - Aortic valve: Valve area (VTI): 2.09 cm^2. Valve area (Vmax):   2.19 cm^2. Valve area (Vmean): 1.95 cm^2. - Mitral valve: There was mild regurgitation.  Reproductive/Obstetrics negative OB ROS                             Anesthesia Physical  Anesthesia Plan  ASA: II  Anesthesia Plan: General   Post-op Pain Management:    Induction: Intravenous  PONV Risk Score and Plan: 2 and Ondansetron, Dexamethasone, Treatment may vary due to age or medical condition, Midazolam and Scopolamine patch -  Pre-op  Airway Management Planned: Oral ETT  Additional Equipment:   Intra-op Plan:   Post-operative Plan: Extubation in OR  Informed Consent: I have reviewed the patients History and Physical, chart, labs and discussed the procedure including the risks, benefits and alternatives for the proposed anesthesia with the patient or authorized representative who has indicated his/her understanding and acceptance.   Dental advisory given  Plan Discussed with: CRNA and Surgeon  Anesthesia Plan Comments: (  )        Anesthesia Quick Evaluation

## 2018-05-29 NOTE — Discharge Instructions (Signed)
[facility] _______[facility] ° °HERNIA REPAIR: POST OP INSTRUCTIONS ° °Always review your discharge instruction sheet given to you by the facility where your surgery was performed. °IF YOU HAVE DISABILITY OR FAMILY LEAVE FORMS, YOU MUST BRING THEM TO THE OFFICE FOR PROCESSING.   °DO NOT GIVE THEM TO YOUR DOCTOR. ° °1. A  prescription for pain medication may be given to you upon discharge.  Take your pain medication as prescribed, if needed.  If narcotic pain medicine is not needed, then you may take acetaminophen (Tylenol) or ibuprofen (Advil) as needed. °2. Take your usually prescribed medications unless otherwise directed. °If you need a refill on your pain medication, please contact your pharmacy.  They will contact our office to request authorization. Prescriptions will not be filled after 5 pm or on week-ends. °3. You should follow a light diet the first 24 hours after arrival home, such as soup and crackers, etc.  Be sure to include lots of fluids daily.  Resume your normal diet the day after surgery. °4.Most patients will experience some swelling and bruising around the umbilicus or in the groin and scrotum.  Ice packs and reclining will help.  Swelling and bruising can take several days to resolve.  °6. It is common to experience some constipation if taking pain medication after surgery.  Increasing fluid intake and taking a stool softener (such as Colace) will usually help or prevent this problem from occurring.  A mild laxative (Milk of Magnesia or Miralax) should be taken according to package directions if there are no bowel movements after 48 hours. °7. Unless discharge instructions indicate otherwise, you may remove your bandages 24-48 hours after surgery, and you may shower at that time.  You may have steri-strips (small skin tapes) in place directly over the incision.  These strips should be left on the skin for 7-10 days.  If your surgeon used skin glue on the incision, you may shower in  24 hours.  The glue will flake off over the next 2-3 weeks.  Any sutures or staples will be removed at the office during your follow-up visit. °8. ACTIVITIES:  You may resume regular (light) daily activities beginning the next day--such as daily self-care, walking, climbing stairs--gradually increasing activities as tolerated.  You may have sexual intercourse when it is comfortable.  Refrain from any heavy lifting or straining until approved by your doctor. ° °a.You may drive when you are no longer taking prescription pain medication, you can comfortably wear a seatbelt, and you can safely maneuver your car and apply brakes. °b.RETURN TO WORK:   °_____________________________________________ ° °9.You should see your doctor in the office for a follow-up appointment approximately 2-3 weeks after your surgery.  Make sure that you call for this appointment within a day or two after you arrive home to insure a convenient appointment time. °10.OTHER INSTRUCTIONS: _________________________ °   _____________________________________ ° °WHEN TO CALL YOUR DOCTOR: °1. Fever over 101.0 °2. Inability to urinate °3. Nausea and/or vomiting °4. Extreme swelling or bruising °5. Continued bleeding from incision. °6. Increased pain, redness, or drainage from the incision ° °The clinic staff is available to answer your questions during regular business hours.  Please don’t hesitate to call and ask to speak to one of the nurses for clinical concerns.  If you have a medical emergency, go to the nearest emergency room or call 911.  A surgeon from [facility] is always on call at the hospital ° ° °[address], [address], [place], [place]  [address] ? ° [address] [address], ,    [address] °[phone] ? [phone] ? FAX [phone] °Web site: [email or web address] ° °

## 2018-05-30 ENCOUNTER — Encounter (HOSPITAL_COMMUNITY): Payer: Self-pay | Admitting: General Surgery

## 2018-06-01 NOTE — H&P (Signed)
History of Present Illness The patient is a 47 year old female who presents with an incisional hernia. Referred by: Dr. Tonny Bollman Chief Complaint: Incisional hernia  Patient is a 47 year old femal, with a history of diabetes, psoriasis who underwent a exploratory laparoscopy for a left cystic removal in October 2018. Patient states that recently over the last several months she was a bulge and some dull discomfort her lower midline area near the incision site. Patient underwent CT scan which did reveal an incisional hernia. I did review this personally. The hernia measures approximately 5.7 x 4 cm.  Patient states that she's had no other previous surgeries. Her last hemoglobin A1c was 5.66 months ago.    Allergies  Penicillins  Allergies Reconciled   Medication History Sabino Gasser; 03/27/2018 10:27 AM) MetFORMIN HCl (500MG  Tablet, Oral) Active. Clobetasol Propionate (0.05% Ointment, External) Active. Tri-Sprintec (0.18/0.215/0.25MG -35 MCG Tablet, Oral) Active. Otezla (30MG  Tablet, Oral) Active. Celecoxib (200MG  Capsule, Oral) Active. Cyclobenzaprine HCl (10MG  Tablet, Oral) Active. Naproxen (500MG  Tablet, Oral) Active. Medications Reconciled    Review of Systems All other systems negative venous stasis dermatitis  BP (!) 170/86   Pulse 100   Temp 97.8 F (36.6 C)   Resp 18   Ht 5\' 3"  (1.6 m)   Wt 96.6 kg (213 lb)   LMP 05/10/2018 (Exact Date)   SpO2 97%   BMI 37.73 kg/m       Physical Exam  The physical exam findings are as follows: Note:Constitutional: No acute distress, conversant, appears stated age  Eyes: Anicteric sclerae, moist conjunctiva, no lid lag  Neck: No thyromegaly, trachea midline, no cervical lymphadenopathy  Lungs: Clear to auscultation biilaterally, normal respiratory effot  Cardiovascular: regular rate & rhythm, no murmurs, no peripheal edema, pedal pulses 2+  GI: Soft, no masses or hepatosplenomegaly,  non-tender to palpation  MSK: Normal gait, no clubbing cyanosis, edema  Skin: No rashes, palpation reveals normal skin turgor  Psychiatric: Appropriate judgment and insight, oriented to person, place, and time  Abdomen Inspection Hernias - Incisional - Reducible(Palpable hernia, approximately 5 x 4 cm just inferior to the umbilicus, midline).    Assessment & Plan  INCISIONAL HERNIA, WITHOUT OBSTRUCTION OR GANGRENE (K43.2) Impression: 47 year old female with an incisional hernia, history of diabetes, psoriasis.  1. The patient will like to proceed to the operating room for laparoscopic incisional hernia repair with mesh.  2. I discussed with the patient the signs and symptoms of incarceration and strangulation and the need to proceed to the ER should they occur.  3. I discussed with the patient the risks and benefits of the procedure to include but not limited to: Infection, bleeding, damage to surrounding structures, possible need for further surgery, possible nerve pain, and possible recurrence. The patient was understanding and wishes to proceed.

## 2018-08-21 ENCOUNTER — Telehealth: Payer: Self-pay | Admitting: Hematology

## 2018-08-21 ENCOUNTER — Encounter: Payer: Self-pay | Admitting: Hematology

## 2018-08-21 NOTE — Telephone Encounter (Signed)
New referral received from Dr. Dorthy Cooler for leukocytosis. Pt has been scheduled to see Dr. Irene Limbo on 9/26 at 10am. Pt aware to arrive 30 minutes early. Letter mailed.

## 2018-09-06 NOTE — Progress Notes (Signed)
HEMATOLOGY/ONCOLOGY CONSULTATION NOTE  Date of Service: 09/07/2018  Patient Care Team: Lujean Amel, MD as PCP - General (Family Medicine)  CHIEF COMPLAINTS/PURPOSE OF CONSULTATION:  Leukocytosis   HISTORY OF PRESENTING ILLNESS:   Denise Barnett is a wonderful 47 y.o. female who has been referred to [place] by Dr. Lujean Amel  for evaluation and management of Leukocytosis. The pt reports that she is doing well overall.   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 She was first diagnosed with psoriasis when she was 47 years old. She also took Plaquenil for about 9 years in the past. She initially began medication a few years ago when she developed lichen planus. She took steroids as well for a week or two at this time.    The pt reports that she has been told she has had elevated white blood cells for many years and that it has not progressed. She is currently taking topical steroids.   The pt notes that she had hernia surgery in June and endorses some occasional abdominal pain when at work. The pt notes that her wound continues to drain intermittently.   She also had a bug bite in May/June and was prescribed antibiotics and steroids, her subsequent WBC count at that time was higher than her baseline at 16.9k.   The pt notes that she has had diarrhea, intermittently urgently, and several times a day. She also notes intermittent depression which does not concern her very much at this time.   Most recent lab results (08/01/18) of CBC w/diff and CMP is as follows: all values are WNL except for WBC at 11.7k, HGB at 11.9, HCT at 36.6, MCV at 78.3, MCH at 25.5, ANC at 8.0k, Monocytes abs at 900, Glucose at 112, Creatinine at 0.55, Sodium at 135.  On review of systems, pt reports occasional joint issues, diarrhea, stable  weight, some depression, occasional abdominal pains, and denies fevers, chills, night sweats, unexpected weight loss, frequent infections, and any other symptoms.   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 Laparotomy with left ovarian cystectomy, oophorectomy left. She denies thyroid problems.  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    MEDICAL HISTORY:  Past Medical History:  Diagnosis Date  . Bronchitis   . Diabetes mellitus without complication ([place])    Type II  . Heel spur, left   . Incisional hernia   . Lichen planus   . OSA (obstructive sleep apnea) 12/25/2013   cpap- does not know settings   . Pneumonia    hx of     SURGICAL HISTORY: Past Surgical History:  Procedure Laterality Date  . BILATERAL SALPINGECTOMY Left 09/15/2017   Procedure: LEFT SALPINGECTOMY;  Surgeon: Everitt Amber, MD;  Location: [facility];  Service: Gynecology;  Laterality: Left;  . INCISIONAL HERNIA REPAIR N/A 05/29/2018   Procedure: LAPAROSCOPIC INCISIONAL HERNIA;  Surgeon: Ralene Ok, MD;  Location: [facility];  Service: General;  Laterality: N/A;  . INSERTION OF MESH N/A 05/29/2018  Procedure: INSERTION OF MESH;  Surgeon: Ralene Ok, MD;  Location: [facility];  Service: General;  Laterality: N/A;  . LAPAROTOMY Bilateral 09/15/2017   Procedure: EXPLORATORY LAPAROTOMY;  Surgeon: Everitt Amber, MD;  Location: [facility];  Service: Gynecology;  Laterality: Bilateral;  . none    . OOPHORECTOMY Left 09/15/2017   Procedure: LEFT OOPHORECTOMY;  Surgeon: Everitt Amber, MD;  Location: [facility];  Service: Gynecology;  Laterality: Left;  [name] VIDEO BRONCHOSCOPY  Bilateral 04/18/2017   Procedure: VIDEO BRONCHOSCOPY WITHOUT FLUORO;  Surgeon: Rush Farmer, MD;  Location: [facility];  Service: Endoscopy;  Laterality: Bilateral;    SOCIAL HISTORY: Social History   Socioeconomic History  . Marital status: Single    Spouse name: Not on file  . Number of children: 1  . Years of education: 57  . Highest education level: Not on file  Occupational History  . Occupation: Scientific laboratory technician: [employer]  Social Needs  . Financial resource strain: Not on file  . Food insecurity:    Worry: Not on file    Inability: Not on file  . Transportation needs:    Medical: Not on file    Non-medical: Not on file  Tobacco Use  . Smoking status: Former Smoker    Packs/day: 0.75    Years: 20.00    Pack years: 15.00    Last attempt to quit: 2010    Years since quitting: 9.7  . Smokeless tobacco: Never Used  Substance and Sexual Activity  . Alcohol use: No  . Drug use: No  . Sexual activity: Yes  Lifestyle  . Physical activity:    Days per week: Not on file    Minutes per session: Not on file  . Stress: Not on file  Relationships  . Social connections:    Talks on phone: Not on file    Gets together: Not on file    Attends religious service: Not on file    Active member of club or organization: Not on file    Attends meetings of clubs or organizations: Not on file    Relationship status: Not on file  . Intimate partner violence:    Fear of current or ex partner: Not on file    Emotionally abused: Not on file    Physically abused: Not on file    Forced sexual activity: Not on file  Other Topics Concern  . Not on file  Social History Narrative  . Not on file    FAMILY HISTORY: Family History  Problem Relation Age of Onset  . Sleep apnea Mother   . Heart attack Brother     ALLERGIES:  is allergic to bactrim [sulfamethoxazole-trimethoprim] and penicillins.  MEDICATIONS:  Current Outpatient Medications  Medication Sig  Dispense Refill  . Apremilast 30 MG TABS Take 30 mg by mouth 2 (two) times daily. OTEZLA    . celecoxib (CELEBREX) 200 MG capsule Take 200 mg by mouth daily as needed. For foot spur pain.  0  . clobetasol cream (TEMOVATE) 4.09 % Apply 1 application topically daily.     . cyclobenzaprine (FLEXERIL) 10 MG tablet Take 1 tablet by mouth daily as needed for muscle spasms.     . fexofenadine (ALLEGRA) 180 MG tablet Take 180 mg by mouth daily.    [name] ibuprofen (ADVIL,MOTRIN) 200 MG tablet Take 600 mg by mouth every 8 (eight) hours as needed for mild pain (for pain.).     [name] metFORMIN (GLUCOPHAGE) 500  MG tablet Take 1,000 mg by mouth 2 (two) times daily with a meal.   0  . Norgestimate-Ethinyl Estradiol Triphasic 0.18/0.215/0.25 MG-35 MCG tablet Take 1 tablet by mouth at bedtime.     . valACYclovir (VALTREX) 500 MG tablet Take 500 mg by mouth 2 (two) times daily as needed. For onset of cold sores.  2   No current facility-administered medications for this visit.     REVIEW OF SYSTEMS:    10 Point review of Systems was done is negative except as noted above.  PHYSICAL EXAMINATION: . Vitals:   09/07/18 1027  BP: 138/71  Pulse: 78  Resp: 18  Temp: 98.3 F (36.8 C)  SpO2: 99%   Filed Weights   09/07/18 1027  Weight: 210 lb (95.3 kg)   .Body mass index is 37.2 kg/m.  GENERAL:alert, in no acute distress and comfortable SKIN: no acute rashes, no significant lesions EYES: conjunctiva are pink and non-injected, sclera anicteric OROPHARYNX: MMM, no exudates, no oropharyngeal erythema or ulceration NECK: supple, no JVD LYMPH:  no palpable lymphadenopathy in the cervical, axillary or inguinal regions LUNGS: clear to auscultation b/l with normal respiratory effort HEART: regular rate & rhythm ABDOMEN:  normoactive bowel sounds , non tender, not distended. Extremity: no pedal edema PSYCH: alert & oriented x 3 with fluent speech NEURO: no focal motor/sensory deficits  LABORATORY DATA:  I have  reviewed the data as listed  . CBC Latest Ref Rng & Units 09/07/2018 05/04/2018 09/16/2017  WBC 3.9 - 10.3 K/uL 12.0(H) 16.9(H) 14.9(H)  Hemoglobin 11.6 - 15.9 g/dL 12.0 12.6 11.3(L)  Hematocrit 34.8 - 46.6 % 36.9 38.6 33.4(L)  Platelets 145 - 400 K/uL 336 409(H) 291   ANC 7.5k . CBC    Component Value Date/Time   WBC 12.0 (H) 09/07/2018 1138   RBC 4.72 09/07/2018 1138   RBC 4.72 09/07/2018 1138   HGB 12.0 09/07/2018 1138   HCT 36.9 09/07/2018 1138   PLT 336 09/07/2018 1138   MCV 78.2 (L) 09/07/2018 1138   MCH 25.4 09/07/2018 1138   MCHC 32.5 09/07/2018 1138   RDW 13.9 09/07/2018 1138   LYMPHSABS 2.9 09/07/2018 1138   MONOABS 0.9 09/07/2018 1138   EOSABS 0.7 (H) 09/07/2018 1138   BASOSABS 0.1 09/07/2018 1138   . CMP Latest Ref Rng & Units 09/07/2018 05/29/2018 05/04/2018  Glucose 70 - 99 mg/dL 96 139(H) 157(H)  BUN 6 - 20 mg/dL 11 9 15   Creatinine 0.44 - 1.00 mg/dL 0.66 0.60 0.59  Sodium 135 - 145 mmol/L 139 136 135  Potassium 3.5 - 5.1 mmol/L 4.5 4.1 4.3  Chloride 98 - 111 mmol/L 105 104 103  CO2 22 - 32 mmol/L 25 22 22   Calcium 8.9 - 10.3 mg/dL 9.9 8.9 8.6(L)  Total Protein 6.5 - 8.1 g/dL 7.9 - -  Total Bilirubin 0.3 - 1.2 mg/dL 0.5 - -  Alkaline Phos 38 - 126 U/L 101 - -  AST 15 - 41 U/L 16 - -  ALT 0 - 44 U/L 20 - -   Component     Latest Ref Rng & Units 09/07/2018  Retic Ct Pct     0.7 - 2.1 % 1.5  RBC.     3.70 - 5.45 MIL/uL 4.72  Retic Count, Absolute     33.7 - 90.7 K/uL 70.8  Sed Rate     0 - 22 mm/hr 12    08/01/18 CBC w/diff:    RADIOGRAPHIC STUDIES: I have personally reviewed the  radiological images as listed and agreed with the findings in the report. No results found.  ASSESSMENT & PLAN:  47 y.o. female with  1. Leukocytosis - likely reactive  Neutrophilia and mild eosinophilia Likely from psoariasis related inflammation, steroid use and surgery. unliley to represent MPN  2. Diarrhea and depression while on Otezla -Recommend that the PCP  and dermatologist continue to watch for infections while pt is on [place] that PCP and dermatologist consider pt's diarrhea and depression concerns as she is currently taking Otezla and monitor for medication management. Patient was encourage to discuss this concern with PCP and dermatologist.  PLAN:  -Discussed patient's most recent labs from 08/01/18, mild leukocytosis with WBC at 11.7k and ANC at 8.0k, HGB at 11.9, MCV at 78.3 -Leukocytosis has been present intermittently and has not been progressive outside of explainable events such as infections and steroid use  -Discussed that the pt's chronic inflammatory disorders of psoriasis and lichen planus, in addition to her surgery, topical steroids use, intermittent infections are likely etiologies for her leucocytosis -overall presentation isl reassuring and suggests against primary bone marrow problem or clonal problem.  -Seek additional evaluation of surgical site if drainage continues or worsens  -Recommend that PCP reconsult [place] if progressive increase in leukocytosis occurs >20k -mx of any infectious issues per PCP  . Orders Placed This Encounter  Procedures  . CBC with Differential/Platelet    Standing Status:   Future    Number of Occurrences:   1    Standing Expiration Date:   10/12/2019  . CMP ([place] only)    Standing Status:   Future    Number of Occurrences:   1    Standing Expiration Date:   09/08/2019  . BCR ABL1 FISH ([facility])    Standing Status:   Future    Number of Occurrences:   1    Standing Expiration Date:   09/08/2019  . Reticulocytes    Standing Status:   Future    Number of Occurrences:   1    Standing Expiration Date:   09/08/2019  . Sedimentation rate    Standing Status:   Future    Number of Occurrences:   1    Standing Expiration Date:   09/08/2019    Labs today RTC with Dr Irene Limbo as needed based on labs    All of the patients questions were answered with apparent satisfaction. The  patient knows to call the clinic with any problems, questions or concerns.  The total time spent in the appt was 45 minutes and more than 50% was on counseling and direct patient cares.    Sullivan Lone MD MS AAHIVMS [facility] Hematology/Oncology Physician [facility]  (Office):       [phone] (Work cell):  [phone] (Fax):           [phone]  09/07/2018 11:22 AM  I, Baldwin [place], am acting as a scribe for Dr. Irene Limbo  .I have reviewed the above documentation for accuracy and completeness, and I agree with the above. Brunetta Genera MD

## 2018-09-07 ENCOUNTER — Inpatient Hospital Stay: Payer: Managed Care, Other (non HMO)

## 2018-09-07 ENCOUNTER — Telehealth: Payer: Self-pay | Admitting: Hematology

## 2018-09-07 ENCOUNTER — Inpatient Hospital Stay: Payer: Managed Care, Other (non HMO) | Attending: Hematology | Admitting: Hematology

## 2018-09-07 ENCOUNTER — Encounter: Payer: Self-pay | Admitting: Hematology

## 2018-09-07 VITALS — BP 138/71 | HR 78 | Temp 98.3°F | Resp 18 | Ht 63.0 in | Wt 210.0 lb

## 2018-09-07 DIAGNOSIS — E119 Type 2 diabetes mellitus without complications: Secondary | ICD-10-CM | POA: Diagnosis not present

## 2018-09-07 DIAGNOSIS — L409 Psoriasis, unspecified: Secondary | ICD-10-CM | POA: Insufficient documentation

## 2018-09-07 DIAGNOSIS — Z808 Family history of malignant neoplasm of other organs or systems: Secondary | ICD-10-CM | POA: Insufficient documentation

## 2018-09-07 DIAGNOSIS — Z7984 Long term (current) use of oral hypoglycemic drugs: Secondary | ICD-10-CM | POA: Insufficient documentation

## 2018-09-07 DIAGNOSIS — F329 Major depressive disorder, single episode, unspecified: Secondary | ICD-10-CM | POA: Insufficient documentation

## 2018-09-07 DIAGNOSIS — Z87891 Personal history of nicotine dependence: Secondary | ICD-10-CM | POA: Diagnosis not present

## 2018-09-07 DIAGNOSIS — D72829 Elevated white blood cell count, unspecified: Secondary | ICD-10-CM

## 2018-09-07 DIAGNOSIS — Z801 Family history of malignant neoplasm of trachea, bronchus and lung: Secondary | ICD-10-CM | POA: Diagnosis not present

## 2018-09-07 DIAGNOSIS — R197 Diarrhea, unspecified: Secondary | ICD-10-CM

## 2018-09-07 LAB — CBC WITH DIFFERENTIAL/PLATELET
BASOS ABS: 0.1 10*3/uL (ref 0.0–0.1)
Basophils Relative: 1 %
EOS PCT: 6 %
Eosinophils Absolute: 0.7 10*3/uL — ABNORMAL HIGH (ref 0.0–0.5)
HCT: 36.9 % (ref 34.8–46.6)
Hemoglobin: 12 g/dL (ref 11.6–15.9)
LYMPHS ABS: 2.9 10*3/uL (ref 0.9–3.3)
LYMPHS PCT: 24 %
MCH: 25.4 pg (ref 25.1–34.0)
MCHC: 32.5 g/dL (ref 31.5–36.0)
MCV: 78.2 fL — AB (ref 79.5–101.0)
Monocytes Absolute: 0.9 10*3/uL (ref 0.1–0.9)
Monocytes Relative: 7 %
Neutro Abs: 7.5 10*3/uL — ABNORMAL HIGH (ref 1.5–6.5)
Neutrophils Relative %: 62 %
Platelets: 336 10*3/uL (ref 145–400)
RBC: 4.72 MIL/uL (ref 3.70–5.45)
RDW: 13.9 % (ref 11.2–14.5)
WBC: 12 10*3/uL — AB (ref 3.9–10.3)

## 2018-09-07 LAB — CMP (CANCER CENTER ONLY)
ALBUMIN: 3.8 g/dL (ref 3.5–5.0)
ALT: 20 U/L (ref 0–44)
ANION GAP: 9 (ref 5–15)
AST: 16 U/L (ref 15–41)
Alkaline Phosphatase: 101 U/L (ref 38–126)
BILIRUBIN TOTAL: 0.5 mg/dL (ref 0.3–1.2)
BUN: 11 mg/dL (ref 6–20)
CHLORIDE: 105 mmol/L (ref 98–111)
CO2: 25 mmol/L (ref 22–32)
Calcium: 9.9 mg/dL (ref 8.9–10.3)
Creatinine: 0.66 mg/dL (ref 0.44–1.00)
GFR, Est AFR Am: >60 mL/min (ref >60)
GLUCOSE: 96 mg/dL (ref 70–99)
Potassium: 4.5 mmol/L (ref 3.5–5.1)
Sodium: 139 mmol/L (ref 135–145)
TOTAL PROTEIN: 7.9 g/dL (ref 6.5–8.1)

## 2018-09-07 LAB — RETICULOCYTES
RBC.: 4.72 MIL/uL (ref 3.70–5.45)
RETIC COUNT ABSOLUTE: 70.8 10*3/uL (ref 33.7–90.7)
RETIC CT PCT: 1.5 % (ref 0.7–2.1)

## 2018-09-07 LAB — SEDIMENTATION RATE: Sed Rate: 12 mm/hr (ref 0–22)

## 2018-09-07 NOTE — Telephone Encounter (Signed)
Gave pt avs °

## 2018-09-15 LAB — BCR ABL1 FISH (GENPATH)

## 2018-09-20 ENCOUNTER — Emergency Department (HOSPITAL_COMMUNITY)
Admission: EM | Admit: 2018-09-20 | Discharge: 2018-09-21 | Disposition: A | Discharge status: Home / Self Care | Payer: Managed Care, Other (non HMO) | Attending: Emergency Medicine | Admitting: Emergency Medicine

## 2018-09-20 ENCOUNTER — Other Ambulatory Visit: Payer: Self-pay

## 2018-09-20 ENCOUNTER — Emergency Department (HOSPITAL_COMMUNITY): Payer: Managed Care, Other (non HMO)

## 2018-09-20 ENCOUNTER — Encounter (HOSPITAL_COMMUNITY): Payer: Self-pay

## 2018-09-20 DIAGNOSIS — Z87891 Personal history of nicotine dependence: Secondary | ICD-10-CM | POA: Diagnosis not present

## 2018-09-20 DIAGNOSIS — K439 Ventral hernia without obstruction or gangrene: Secondary | ICD-10-CM | POA: Diagnosis not present

## 2018-09-20 DIAGNOSIS — E119 Type 2 diabetes mellitus without complications: Secondary | ICD-10-CM | POA: Diagnosis not present

## 2018-09-20 DIAGNOSIS — Z7984 Long term (current) use of oral hypoglycemic drugs: Secondary | ICD-10-CM | POA: Insufficient documentation

## 2018-09-20 DIAGNOSIS — E785 Hyperlipidemia, unspecified: Secondary | ICD-10-CM | POA: Diagnosis not present

## 2018-09-20 DIAGNOSIS — R1084 Generalized abdominal pain: Secondary | ICD-10-CM | POA: Diagnosis present

## 2018-09-20 LAB — COMPREHENSIVE METABOLIC PANEL
ALBUMIN: 4 g/dL (ref 3.5–5.0)
ALK PHOS: 80 U/L (ref 38–126)
ALT: 14 U/L (ref 0–44)
ANION GAP: 11 (ref 5–15)
AST: 18 U/L (ref 15–41)
BILIRUBIN TOTAL: 0.5 mg/dL (ref 0.3–1.2)
BUN: 11 mg/dL (ref 6–20)
CALCIUM: 9.8 mg/dL (ref 8.9–10.3)
CO2: 26 mmol/L (ref 22–32)
Chloride: 104 mmol/L (ref 98–111)
Creatinine, Ser: 0.65 mg/dL (ref 0.44–1.00)
Glucose, Bld: 104 mg/dL — ABNORMAL HIGH (ref 70–99)
POTASSIUM: 4.1 mmol/L (ref 3.5–5.1)
Sodium: 141 mmol/L (ref 135–145)
Total Protein: 8.2 g/dL — ABNORMAL HIGH (ref 6.5–8.1)

## 2018-09-20 LAB — CBC
HCT: 36 % (ref 36.0–46.0)
HEMOGLOBIN: 11.6 g/dL — AB (ref 12.0–15.0)
MCH: 25.3 pg — AB (ref 26.0–34.0)
MCHC: 32.2 g/dL (ref 30.0–36.0)
MCV: 78.4 fL — ABNORMAL LOW (ref 80.0–100.0)
NRBC: 0 % (ref 0.0–0.2)
PLATELETS: 419 10*3/uL — AB (ref 150–400)
RBC: 4.59 MIL/uL (ref 3.87–5.11)
RDW: 13.9 % (ref 11.5–15.5)
WBC: 11.9 10*3/uL — AB (ref 4.0–10.5)

## 2018-09-20 LAB — URINALYSIS, ROUTINE W REFLEX MICROSCOPIC
Bilirubin Urine: NEGATIVE
GLUCOSE, UA: NEGATIVE mg/dL
HGB URINE DIPSTICK: NEGATIVE
KETONES UR: NEGATIVE mg/dL
NITRITE: NEGATIVE
PH: 5 (ref 5.0–8.0)
Protein, ur: NEGATIVE mg/dL
Specific Gravity, Urine: 1.012 (ref 1.005–1.030)

## 2018-09-20 LAB — LIPASE, BLOOD: Lipase: 40 U/L (ref 11–51)

## 2018-09-20 LAB — I-STAT BETA HCG BLOOD, ED (MC, WL, AP ONLY): I-stat hCG, quantitative: <5 m[IU]/mL (ref <5)

## 2018-09-20 IMAGING — CT CT ABD-PELV W/ CM
2 of 5 series · 16 of 46 positions shown, 18 images · IV contrast (ISOVUE)
Comparison: [DATE]

CLINICAL DATA: Umbilical pain for several weeks. Hernia repair in
[DATE].

EXAM:
CT ABDOMEN AND PELVIS WITH CONTRAST
TECHNIQUE: Multidetector CT imaging of the abdomen and pelvis was performed
using the standard protocol following bolus administration of
intravenous contrast.
CONTRAST:  100mL [8L] IOPAMIDOL ([8L]) INJECTION 61%

[Series 2: axial st · axial · 0.73mm/px · z∈[+1070,+1480]mm · 13 of 96 slices shown, 15 images]
[im 7/96  soft-tissue]
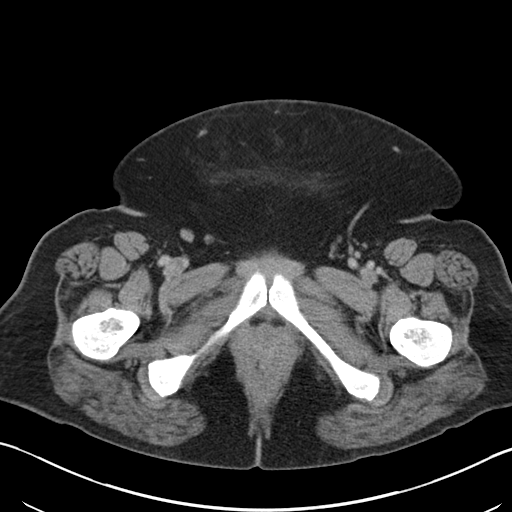
[im 7/96  bone]
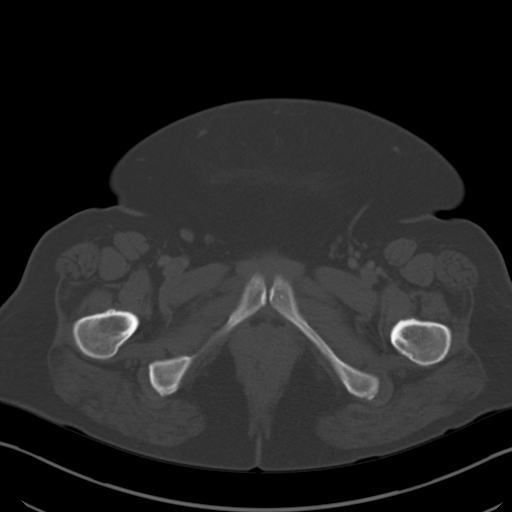
[im 13/96  soft-tissue]
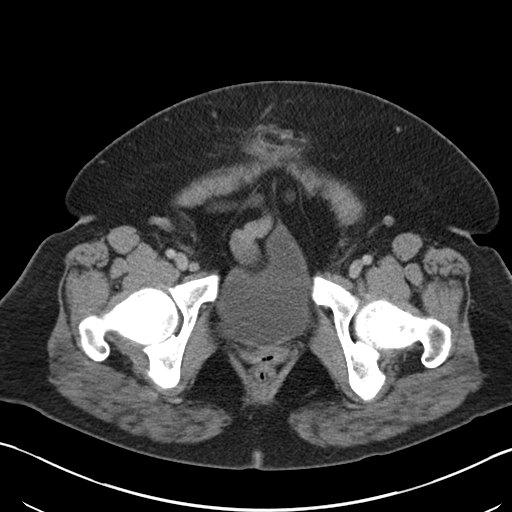
[im 20/96  soft-tissue]
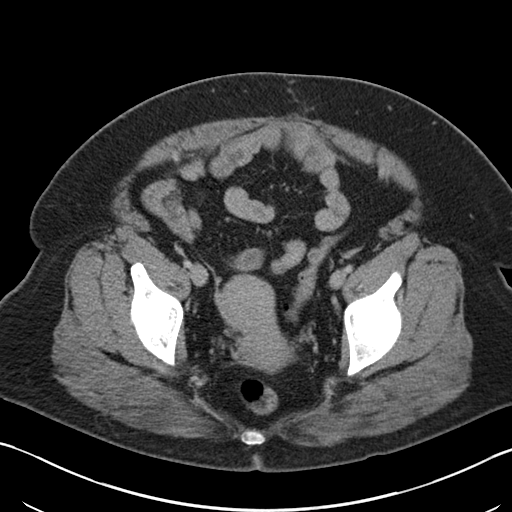
[im 26/96  soft-tissue]
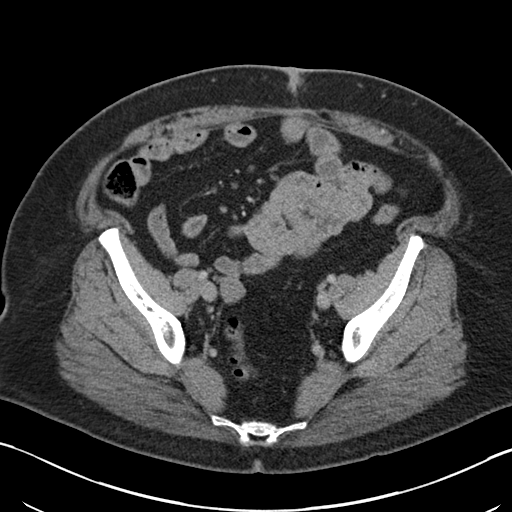
[im 32/96  soft-tissue]
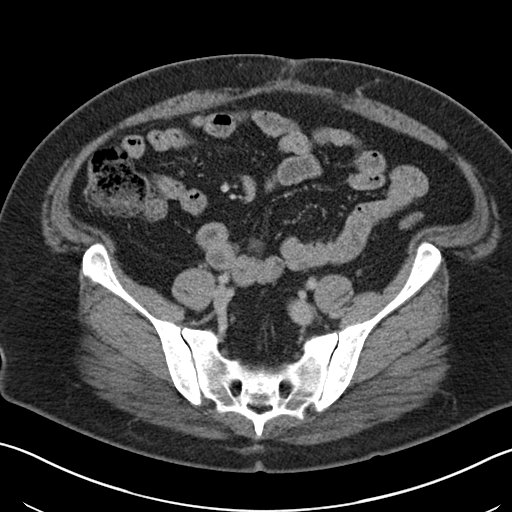
[im 39/96  soft-tissue]
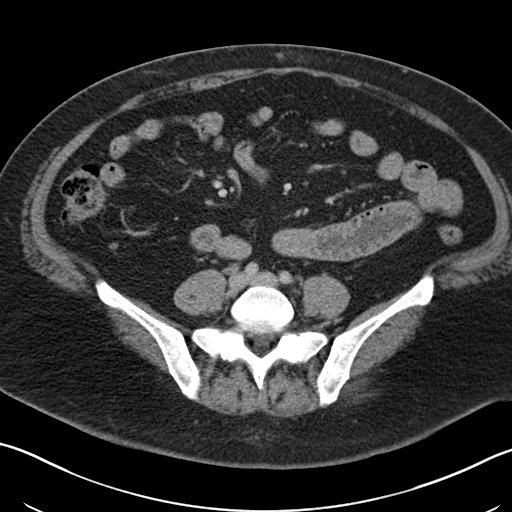
[im 51/96  soft-tissue]
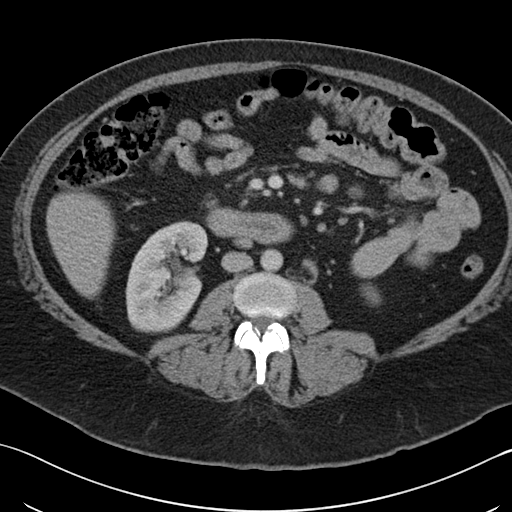
[im 58/96  soft-tissue]
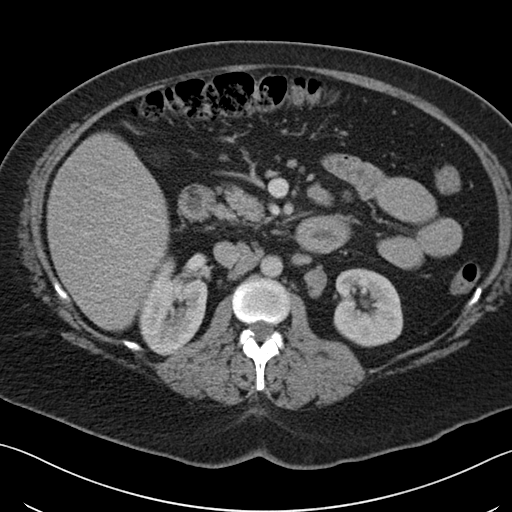
[im 64/96  soft-tissue]
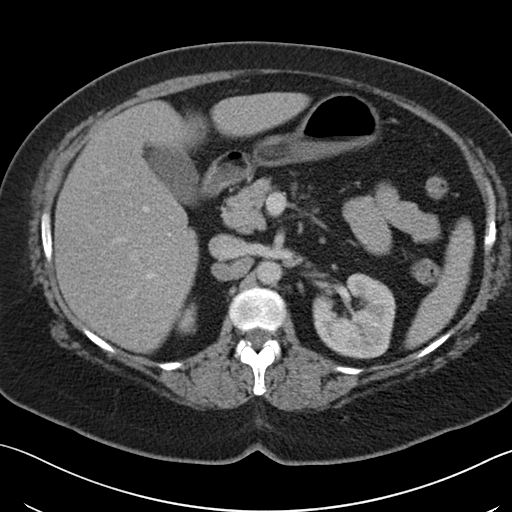
[im 64/96  bone]
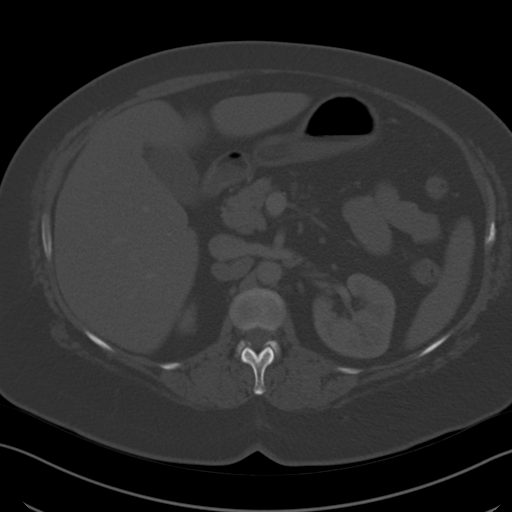
[im 70/96  soft-tissue]
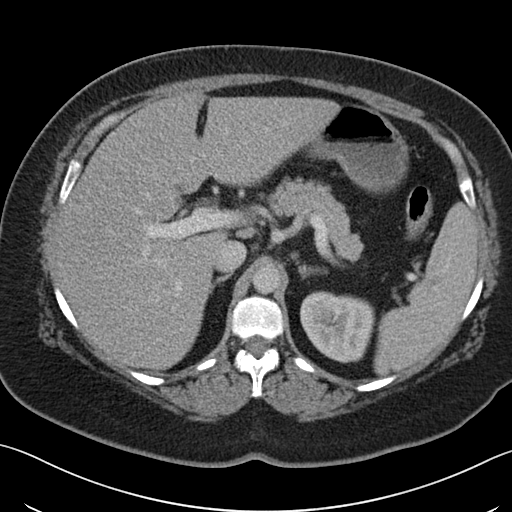
[im 77/96  soft-tissue]
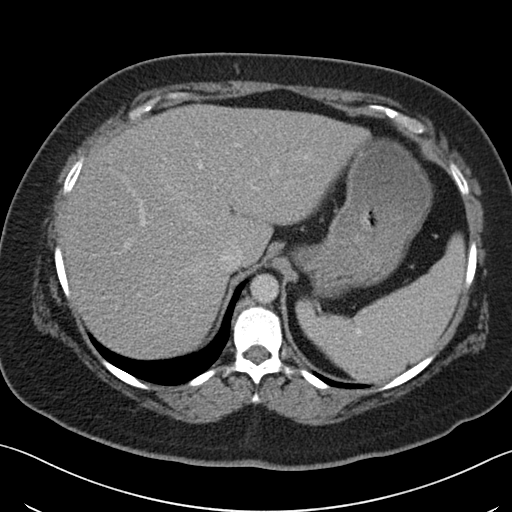
[im 83/96  soft-tissue]
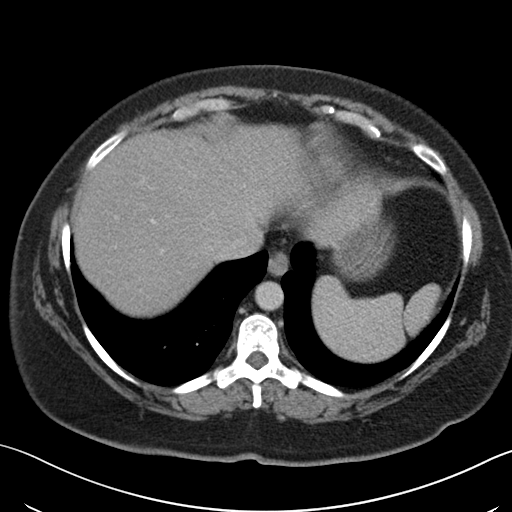
[im 89/96  soft-tissue]
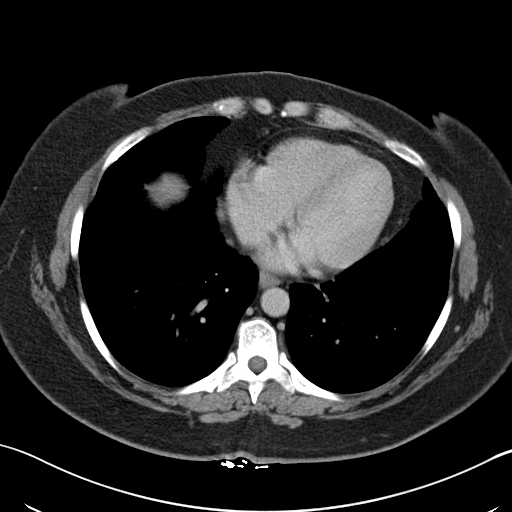

[Series 4: coronal st · coronal · 0.81mm/px · 3 of 101 slices shown]
[im 34/101  soft-tissue]
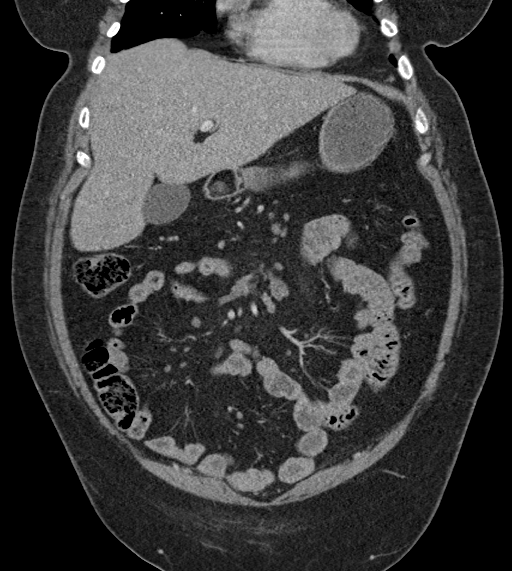
[im 45/101  soft-tissue]
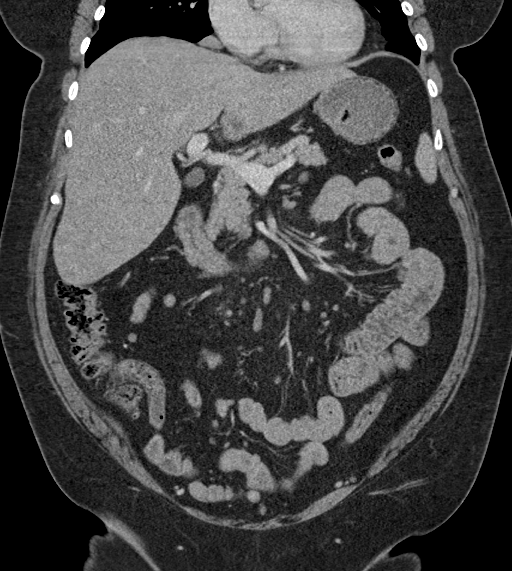
[im 56/101  soft-tissue]
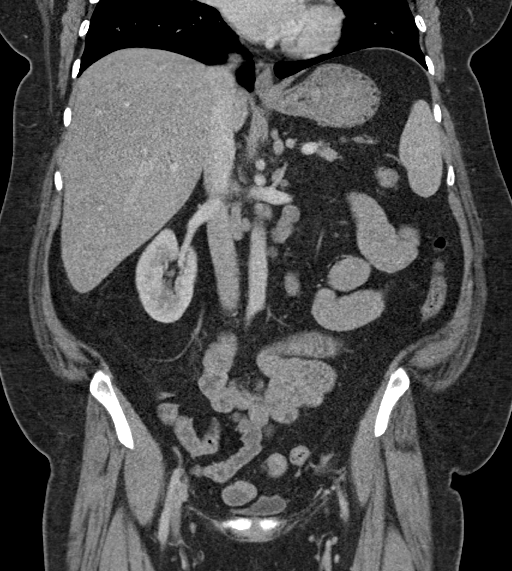

[16 of 46 positions shown; findings below may reference images not displayed]

FINDINGS: Lower chest: Lung bases are clear.

Hepatobiliary: No focal liver abnormality is seen. No gallstones,
gallbladder wall thickening, or biliary dilatation.

Pancreas: Unremarkable. No pancreatic ductal dilatation or
surrounding inflammatory changes.

Spleen: Normal in size without focal abnormality.

Adrenals/Urinary Tract: Adrenal glands are unremarkable. Kidneys are
normal, without renal calculi, focal lesion, or hydronephrosis.
Bladder is unremarkable.

Stomach/Bowel: Stomach is within normal limits. Appendix appears
normal. No evidence of bowel wall thickening, distention, or
inflammatory changes.

Vascular/Lymphatic: Aortic atherosclerosis. Moderately prominent
lymph nodes throughout the celiac axis, mesentery, and
retroperitoneum, measuring up to about 12 mm short axis dimension.
Similar appearance to previous study.

Reproductive: Uterus and bilateral adnexa are unremarkable.

Other: There is a broad-based infraumbilical anterior abdominal wall
ventral hernia containing fat and small bowel but without evidence
of proximal obstruction.

Musculoskeletal: No acute or significant osseous findings.
IMPRESSION: 1. No evidence of bowel obstruction or inflammation. Normal
appendix.
2. Mild lymphadenopathy throughout the celiac axis, mesentery, and
retroperitoneum, similar appearance to previous study. Indeterminate
etiology.
3. Broad-based infraumbilical anterior abdominal wall ventral hernia
containing fat and small bowel but without evidence of proximal
obstruction. Query residual or recurrent hernia.

## 2018-09-20 MED ORDER — IOPAMIDOL (ISOVUE-300) INJECTION 61%: 100.0000 mL | Freq: Once | INTRAVENOUS | Status: DC | PRN

## 2018-09-20 MED ORDER — IOPAMIDOL (ISOVUE-300) INJECTION 61%
INTRAVENOUS | Status: AC
  Filled 2018-09-20: qty 100

## 2018-09-20 MED ORDER — IOPAMIDOL (ISOVUE-300) INJECTION 61%
100.0000 mL | Freq: Once | INTRAVENOUS | Status: AC | PRN
  Administered 2018-09-20: 100 mL via INTRAVENOUS

## 2018-09-20 NOTE — ED Provider Notes (Signed)
[facility] DEPT Provider Note   CSN: [id number] Arrival date & time: 09/20/18  1938     History   Chief Complaint No chief complaint on file.   HPI Denise Barnett is a 47 y.o. female.  HPI   Denise Barnett is a 47 year old female with a history of incisional hernia with mesh repair (05/2017), left cyst removal (09/2017), psoriasis, type 2 diabetes, OSA who presents to the emergency department for evaluation of right-sided abdominal pain.  Patient reports that she has had intermittent right-sided pain with coughing and sneezing since her hernia repair.  She was seen by Dr. Rosendo Gros her surgeon who told her that this was likely related to nerve damage.  Today she reports the pain has been constant and more severe than usual.  She reports pain is sharp and worsened with bending over, coughing or sneezing.  She has tried Tylenol and ibuprofen without improvement.  She also reports her lower abdomen is more bloated than usual.  She denies fevers, chills, nausea/vomiting, dysuria, urinary frequency, flank pain, vaginal discharge, vaginal bleeding, chest pain, shortness of breath lightheadedness, syncope.  She is able to pass gas.  She has chronic diarrhea on her [place] which is unchanged from baseline.  No hematochezia or melena.  She called her PCP today who sent her to the emergency department stating that she would likely need a CT scan for further evaluation.  Past Medical History:  Diagnosis Date  . Bronchitis   . Diabetes mellitus without complication ([place])    Type II  . Heel spur, left   . Incisional hernia   . Lichen planus   . OSA (obstructive sleep apnea) 12/25/2013   cpap- does not know settings   . Pneumonia    hx of     Patient Active Problem List   Diagnosis Date Noted  . 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 Pelvic mass 09/15/2017  . Pelvic mass in female 09/15/2017  . Abdominal pain 09/01/2017  . Morbid obesity due to  excess calories ([place]) complicated by DM  54/00/8676  . Rhinitis, allergic 04/27/2017  . Diabetes mellitus ([place]) 04/25/2017  . Lichen planus 19/50/9326  . Community acquired pneumonia   . 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 Psoriasis on humria x 1 year, 2 negative TB test 04/16/2017  . Former cigarette smoker age 54 - 24 1 ppd or less 04/16/2017  . Dyspnea on exertion, worse over last 12 weeks 04/16/2017  . Lung nodules   . Hyperlipidemia 06/12/2015  . OSA (obstructive sleep apnea) 12/25/2013  . Tobacco use disorder 11/04/2006    Past Surgical History:  Procedure Laterality Date  . BILATERAL SALPINGECTOMY Left 09/15/2017   Procedure: LEFT SALPINGECTOMY;  Surgeon: Everitt Amber, MD;  Location: [facility];  Service: Gynecology;  Laterality: Left;  . INCISIONAL HERNIA REPAIR N/A 05/29/2018   Procedure: LAPAROSCOPIC INCISIONAL HERNIA;  Surgeon: Ralene Ok, MD;  Location: [facility];  Service: General;  Laterality: N/A;  . INSERTION OF MESH N/A 05/29/2018   Procedure: INSERTION OF MESH;  Surgeon: Ralene Ok, MD;  Location: [facility];  Service: General;  Laterality: N/A;  . LAPAROTOMY Bilateral 09/15/2017   Procedure: EXPLORATORY LAPAROTOMY;  Surgeon: Everitt Amber, MD;  Location: [facility];  Service: Gynecology;  Laterality: Bilateral;  . none    . OOPHORECTOMY Left 09/15/2017   Procedure: LEFT OOPHORECTOMY;  Surgeon: Everitt Amber, MD;  Location: [facility];  Service: Gynecology;  Laterality: Left;  [name] VIDEO BRONCHOSCOPY Bilateral 04/18/2017  Procedure: VIDEO BRONCHOSCOPY WITHOUT FLUORO;  Surgeon: Rush Farmer, MD;  Location: [facility];  Service: Endoscopy;  Laterality: Bilateral;     OB History   None      Home Medications    Prior to Admission medications   Medication Sig Start Date End Date Taking? Authorizing Provider  Apremilast 30 MG TABS Take 30 mg by mouth 2 (two) times daily. [place]    [provider]  celecoxib (CELEBREX) 200 MG capsule Take 200 mg by mouth daily as  needed. For foot spur pain. 04/05/18   [provider]  clobetasol cream (TEMOVATE) 0.10 % Apply 1 application topically daily.     [provider]  cyclobenzaprine (FLEXERIL) 10 MG tablet Take 1 tablet by mouth daily as needed for muscle spasms.     [provider]  fexofenadine (ALLEGRA) 180 MG tablet Take 180 mg by mouth daily.    [provider]  ibuprofen (ADVIL,MOTRIN) 200 MG tablet Take 600 mg by mouth every 8 (eight) hours as needed for mild pain (for pain.).     [provider]  metFORMIN (GLUCOPHAGE) 500 MG tablet Take 1,000 mg by mouth 2 (two) times daily with a meal.  10/18/15   [provider]  Norgestimate-Ethinyl Estradiol Triphasic 0.18/0.215/0.25 MG-35 MCG tablet Take 1 tablet by mouth at bedtime.  09/23/13   [provider]  valACYclovir (VALTREX) 500 MG tablet Take 500 mg by mouth 2 (two) times daily as needed. For onset of cold sores. 04/19/18   [provider]    Family History Family History  Problem Relation Age of Onset  . Sleep apnea Mother   . Heart attack Brother     Social History Social History   Tobacco Use  . Smoking status: Former Smoker    Packs/day: 0.75    Years: 20.00    Pack years: 15.00    Last attempt to quit: 2010    Years since quitting: 9.7  . Smokeless tobacco: Never Used  Substance Use Topics  . Alcohol use: No  . Drug use: No     Allergies   Bactrim [sulfamethoxazole-trimethoprim] and Penicillins   Review of Systems Review of Systems  Constitutional: Negative for chills and fever.  Respiratory: Negative for shortness of breath.   Cardiovascular: Negative for chest pain.  Gastrointestinal: Positive for abdominal pain and diarrhea. Negative for blood in stool, nausea and vomiting.  Genitourinary: Negative for difficulty urinating, dysuria, flank pain, frequency, vaginal bleeding and vaginal discharge.  Musculoskeletal: Negative for back pain.  Skin: Negative for  color change.  Neurological: Negative for syncope and light-headedness.  Psychiatric/Behavioral: Negative for agitation.  All other systems reviewed and are negative.    Physical Exam Updated Vital Signs BP (!) 142/88 (BP Location: Left Arm)   Pulse 87   Temp 98.2 F (36.8 C) (Oral)   Resp 18   SpO2 98%   Physical Exam  Constitutional: She is oriented to person, place, and time. She appears well-developed and well-nourished. No distress.  Nontoxic-appearing.  HENT:  Head: Normocephalic and atraumatic.  Mouth/Throat: Oropharynx is clear and moist.  Mucous memories moist.  Eyes: Pupils are equal, round, and reactive to light. Conjunctivae are normal. Right eye exhibits no discharge. Left eye exhibits no discharge.  Neck: Normal range of motion.  Cardiovascular: Normal rate, regular rhythm and intact distal pulses.  Pulmonary/Chest: Effort normal and breath sounds normal. No stridor. No respiratory distress. She has no wheezes. She has no rales.  Abdominal:  Abdomen soft and nondistended.  Bowel sounds normoactive in all 4 quadrants.  Midline surgical scar which is well-healed.  Tender to palpation right of the epigastrium.  No guarding, rigidity or rebound tenderness.  No CVA tenderness.  Musculoskeletal: Normal range of motion.  Neurological: She is alert and oriented to person, place, and time. Coordination normal.  Skin: Skin is warm and dry. She is not diaphoretic.  Psychiatric: She has a normal mood and affect. Her behavior is normal.  Nursing note and vitals reviewed.   ED Treatments / Results  Labs (all labs ordered are listed, but only abnormal results are displayed) Labs Reviewed  COMPREHENSIVE METABOLIC PANEL - Abnormal; Notable for the following components:      Result Value   Glucose, Bld 104 (*)    Total Protein 8.2 (*)    All other components within normal limits  CBC - Abnormal; Notable for the following components:   WBC 11.9 (*)    Hemoglobin 11.6 (*)     MCV 78.4 (*)    MCH 25.3 (*)    Platelets 419 (*)    All other components within normal limits  URINALYSIS, ROUTINE W REFLEX MICROSCOPIC - Abnormal; Notable for the following components:   Leukocytes, UA MODERATE (*)    Bacteria, UA RARE (*)    All other components within normal limits  LIPASE, BLOOD  I-STAT BETA HCG BLOOD, ED ([facility], [facility], [facility] ONLY)    EKG None  Radiology No results found.  Procedures Procedures (including critical care time)  Medications Ordered in ED Medications  iopamidol (ISOVUE-300) 61 % injection 100 mL (has no administration in time range)  iopamidol (ISOVUE-300) 61 % injection (has no administration in time range)  iopamidol (ISOVUE-300) 61 % injection 100 mL (100 mLs Intravenous Contrast Given 09/20/18 2239)     Initial Impression / Assessment and Plan / ED Course  I have reviewed the triage vital signs and the nursing notes.  Pertinent labs & imaging results that were available during my care of the patient were reviewed by me and considered in my medical decision making (see chart for details).    CT abdomen/pelvis scan reveals infraumbilical anterior abdominal wall ventral hernia containing fat and small bowel, but without evidence of obstruction. She also has lymphadenopathy throughout the celiac axis mesentery and retroperitoneum which is similar to prior studies. Symptoms consistent with recurrence of abdominal hernia given worsened with valsalva. Otherwise patient's lab work is unremarkable. CBC reveals elevated WBC (11.9) which is similar to prior. CMP without major electrolyte abnormalities, liver enzymes and creatinine within normal. UA without infection. Lipase negative. BetaHcg negative. No pelvic pain or vaginal discharge and I do not suspect TOA or ovarian torsion. Patient is able to tolerate po fluids. Plan to discharge her with instructions to follow up with her general surgeon for further recommendations regarding recurrence of her ventral  hernia. Counseled her on reasons to return to the ED immediately and she agrees.   Final Clinical Impressions(s) / ED Diagnoses   Final diagnoses:  Ventral hernia without obstruction or gangrene    ED Discharge Orders    None       Glyn Ade, PA-C 09/21/18 Illene Bolus, MD 09/22/18 1114

## 2018-09-20 NOTE — ED Triage Notes (Signed)
Pt reports that she had hernia repair in June. She reports intermittent pain at the site since the surgery, but today has had constant pain at the site. Denies N/V at this time.

## 2018-09-21 MED ORDER — HYDROCODONE-ACETAMINOPHEN 5-325 MG PO TABS: 1.0000 | ORAL_TABLET | ORAL | 0 refills | Status: DC | PRN

## 2018-09-21 NOTE — Discharge Instructions (Signed)
Please schedule follow-up appointment with Dr. Rosendo Gros for recurrence of your hernia.  I have attached the CT scan results to this paperwork.  Come back to the ER if you have any new or concerning symptoms like fever, vomiting, unable to pass gas, worsening pain that persists.

## 2018-09-22 ENCOUNTER — Other Ambulatory Visit: Payer: Self-pay | Admitting: Family Medicine

## 2018-09-22 DIAGNOSIS — R1033 Periumbilical pain: Secondary | ICD-10-CM

## 2018-10-12 ENCOUNTER — Ambulatory Visit: Payer: Self-pay | Admitting: General Surgery

## 2018-10-12 NOTE — H&P (Signed)
History of Present Illness Denise Ok MD; 10/12/2018 11:08 AM) The patient is a 47 year old female presenting for a post-operative visit. Patient comes back in today after having previous hernia repair in June 2019. Patient continued right lower quadrant pain that is sharp sneezing, coughing, abdominal wall contraction like bending over. Patient recently underwent CT scan which I reviewed personally. This did review small eventration or recurrent hernia in the lower portion of her abdomen. There was this was an area that there is no transfascial sutures placed.     Medication History (Armen Ferguson, CMA; 10/12/2018 10:28 AM) Ibuprofen (200MG  Capsule, Oral) Active. MetFORMIN HCl (500MG  Tablet, Oral) Active. Clobetasol Propionate (0.05% Ointment, External) Active. Tri-Sprintec (0.18/0.215/0.25MG -35 MCG Tablet, Oral) Active. Otezla (30MG  Tablet, Oral) Active. Celecoxib (200MG  Capsule, Oral) Active. Cyclobenzaprine HCl (10MG  Tablet, Oral) Active. Medications Reconciled    Review of Systems Denise Ok, MD; 10/12/2018 11:11 AM) General Not Present- Appetite Loss, Chills, Fatigue, Fever, Night Sweats, Weight Gain and Weight Loss. Skin Not Present- Change in Wart/Mole, Dryness, Hives, Jaundice, New Lesions, Non-Healing Wounds, Rash and Ulcer. HEENT Present- Seasonal Allergies and Wears glasses/contact lenses. Not Present- Earache, Hearing Loss, Hoarseness, Nose Bleed, Oral Ulcers, Ringing in the Ears, Sinus Pain, Sore Throat, Visual Disturbances and Yellow Eyes. Respiratory Present- Chronic Cough. Not Present- Bloody sputum, Difficulty Breathing, Snoring and Wheezing. Breast Not Present- Breast Mass, Breast Pain, Nipple Discharge and Skin Changes. Cardiovascular Not Present- Chest Pain, Difficulty Breathing Lying Down, Leg Cramps, Palpitations, Rapid Heart Rate, Shortness of Breath and Swelling of Extremities. Gastrointestinal Not Present- Abdominal Pain, Bloating, Bloody  Stool, Change in Bowel Habits, Chronic diarrhea, Constipation, Difficulty Swallowing, Excessive gas, Gets full quickly at meals, Hemorrhoids, Indigestion, Nausea, Rectal Pain and Vomiting. Musculoskeletal Not Present- Back Pain, Joint Pain, Joint Stiffness, Muscle Pain, Muscle Weakness and Swelling of Extremities. Neurological Not Present- Decreased Memory, Fainting, Headaches, Numbness, Seizures, Tingling, Tremor, Trouble walking and Weakness. Psychiatric Present- Anxiety and Depression. Not Present- Bipolar, Change in Sleep Pattern, Fearful and Frequent crying. Endocrine Not Present- Cold Intolerance, Excessive Hunger, Hair Changes, Heat Intolerance and New Diabetes. Hematology Not Present- Easy Bruising, Excessive bleeding, Gland problems, HIV and Persistent Infections.  Vitals (Armen Ferguson CMA; 10/12/2018 10:28 AM) 10/12/2018 10:27 AM Weight: 211 lb Height: 64in Body Surface Area: 2 m Body Mass Index: 36.22 kg/m  Temp.: 98.45F  Pulse: 92 (Regular)  P.OX: 98% (Room air) BP: 130/78 (Sitting, Left Arm, Standard)       Physical Exam Denise Ok, MD; 10/12/2018 11:11 AM) General Mental Status-Alert. General Appearance-Consistent with stated age. Hydration-Well hydrated. Voice-Normal.  Chest and Lung Exam Inspection Chest Wall - Normal. Back - normal.  Cardiovascular Cardiovascular examination reveals -on palpation PMI is normal in location and amplitude, no palpable S3 or S4. Normal cardiac borders., normal heart sounds, regular rate and rhythm with no murmurs, carotid auscultation reveals no bruits and normal pedal pulses bilaterally.  Abdomen Inspection Normal Exam - No Hernias and Incision healing well without signs of infections or seroma.    Assessment & Plan Denise Ok MD; 10/12/2018 11:11 AM) POST-OPERATIVE STATE [phone]) Impression: Patient is a 47 year old female status post hernia repair with mesh, Approximate 4 months  1. Will  proceed with operative for diagnostic laparoscopy, removal of right transfascial stitch. 2. Possible open onlay hernia repair with mesh at the lower abdominal incision site. This could be an area eventration as well. 3. All risks and benefits were discussed with the patient to generally include, but not limited to: infection, bleeding, damage  to surrounding structures, acute and chronic nerve pain, and recurrence. Alternatives were offered and described. All questions were answered and the patient voiced understanding of the procedure and wishes to proceed at this point with hernia repair.

## 2018-10-12 NOTE — H&P (View-Only) (Signed)
History of Present Illness Denise Ok MD; 10/12/2018 11:08 AM) The patient is a 47 year old female presenting for a post-operative visit. Patient comes back in today after having previous hernia repair in June 2019. Patient continued right lower quadrant pain that is sharp sneezing, coughing, abdominal wall contraction like bending over. Patient recently underwent CT scan which I reviewed personally. This did review small eventration or recurrent hernia in the lower portion of her abdomen. There was this was an area that there is no transfascial sutures placed.     Medication History (Armen Ferguson, CMA; 10/12/2018 10:28 AM) Ibuprofen (200MG  Capsule, Oral) Active. MetFORMIN HCl (500MG  Tablet, Oral) Active. Clobetasol Propionate (0.05% Ointment, External) Active. Tri-Sprintec (0.18/0.215/0.25MG -35 MCG Tablet, Oral) Active. Otezla (30MG  Tablet, Oral) Active. Celecoxib (200MG  Capsule, Oral) Active. Cyclobenzaprine HCl (10MG  Tablet, Oral) Active. Medications Reconciled    Review of Systems Denise Ok, MD; 10/12/2018 11:11 AM) General Not Present- Appetite Loss, Chills, Fatigue, Fever, Night Sweats, Weight Gain and Weight Loss. Skin Not Present- Change in Wart/Mole, Dryness, Hives, Jaundice, New Lesions, Non-Healing Wounds, Rash and Ulcer. HEENT Present- Seasonal Allergies and Wears glasses/contact lenses. Not Present- Earache, Hearing Loss, Hoarseness, Nose Bleed, Oral Ulcers, Ringing in the Ears, Sinus Pain, Sore Throat, Visual Disturbances and Yellow Eyes. Respiratory Present- Chronic Cough. Not Present- Bloody sputum, Difficulty Breathing, Snoring and Wheezing. Breast Not Present- Breast Mass, Breast Pain, Nipple Discharge and Skin Changes. Cardiovascular Not Present- Chest Pain, Difficulty Breathing Lying Down, Leg Cramps, Palpitations, Rapid Heart Rate, Shortness of Breath and Swelling of Extremities. Gastrointestinal Not Present- Abdominal Pain, Bloating, Bloody  Stool, Change in Bowel Habits, Chronic diarrhea, Constipation, Difficulty Swallowing, Excessive gas, Gets full quickly at meals, Hemorrhoids, Indigestion, Nausea, Rectal Pain and Vomiting. Musculoskeletal Not Present- Back Pain, Joint Pain, Joint Stiffness, Muscle Pain, Muscle Weakness and Swelling of Extremities. Neurological Not Present- Decreased Memory, Fainting, Headaches, Numbness, Seizures, Tingling, Tremor, Trouble walking and Weakness. Psychiatric Present- Anxiety and Depression. Not Present- Bipolar, Change in Sleep Pattern, Fearful and Frequent crying. Endocrine Not Present- Cold Intolerance, Excessive Hunger, Hair Changes, Heat Intolerance and New Diabetes. Hematology Not Present- Easy Bruising, Excessive bleeding, Gland problems, HIV and Persistent Infections.  Vitals (Armen Ferguson CMA; 10/12/2018 10:28 AM) 10/12/2018 10:27 AM Weight: 211 lb Height: 64in Body Surface Area: 2 m Body Mass Index: 36.22 kg/m  Temp.: 98.62F  Pulse: 92 (Regular)  P.OX: 98% (Room air) BP: 130/78 (Sitting, Left Arm, Standard)       Physical Exam Denise Ok, MD; 10/12/2018 11:11 AM) General Mental Status-Alert. General Appearance-Consistent with stated age. Hydration-Well hydrated. Voice-Normal.  Chest and Lung Exam Inspection Chest Wall - Normal. Back - normal.  Cardiovascular Cardiovascular examination reveals -on palpation PMI is normal in location and amplitude, no palpable S3 or S4. Normal cardiac borders., normal heart sounds, regular rate and rhythm with no murmurs, carotid auscultation reveals no bruits and normal pedal pulses bilaterally.  Abdomen Inspection Normal Exam - No Hernias and Incision healing well without signs of infections or seroma.    Assessment & Plan Denise Ok MD; 10/12/2018 11:11 AM) POST-OPERATIVE STATE [phone]) Impression: Patient is a 47 year old female status post hernia repair with mesh, Approximate 4 months  1. Will  proceed with operative for diagnostic laparoscopy, removal of right transfascial stitch. 2. Possible open onlay hernia repair with mesh at the lower abdominal incision site. This could be an area eventration as well. 3. All risks and benefits were discussed with the patient to generally include, but not limited to: infection, bleeding, damage  to surrounding structures, acute and chronic nerve pain, and recurrence. Alternatives were offered and described. All questions were answered and the patient voiced understanding of the procedure and wishes to proceed at this point with hernia repair.

## 2018-10-27 NOTE — Pre-Procedure Instructions (Signed)
Denise Barnett  10/27/2018      [id number] - [place], [place] - [address] AT [address]OSS [address] [place] [address] Phone: [phone] Fax: [phone]    Your procedure is scheduled on Tuesday November 26.  Report to [facility] Admitting at 7:15 A.M.  Call this number if you have problems the morning of surgery:  [phone]   Remember:  Do not eat or drink after midnight.    Take these medicines the morning of surgery with A SIP OF WATER:   Fexofenadine if needed Hydrocodone if needed Valtrex if needed  7 days prior to surgery STOP taking any Aspirin(unless otherwise instructed by your surgeon), celecoxib (Celebrex), Aleve, Naproxen, Ibuprofen, Motrin, Advil, Goody's, BC's, all herbal medications, fish oil, and all vitamins   DO NOT take Metformin (Glucophage) the day of surgery     How to Manage Your Diabetes Before and After Surgery  Why is it important to control my blood sugar before and after surgery? . Improving blood sugar levels before and after surgery helps healing and can limit problems. . A way of improving blood sugar control is eating a healthy diet by: o  Eating less sugar and carbohydrates o  Increasing activity/exercise o  Talking with your doctor about reaching your blood sugar goals . High blood sugars (greater than 180 mg/dL) can raise your risk of infections and slow your recovery, so you will need to focus on controlling your diabetes during the weeks before surgery. . Make sure that the doctor who takes care of your diabetes knows about your planned surgery including the date and location.  How do I manage my blood sugar before surgery? . Check your blood sugar at least 4 times a day, starting 2 days before surgery, to make sure that the level is not too high or low. o Check your blood sugar the morning of your surgery when you wake up and every 2 hours until you get to the  Short Stay unit. . If your blood sugar is less than 70 mg/dL, you will need to treat for low blood sugar: o Do not take insulin. o Treat a low blood sugar (less than 70 mg/dL) with  cup of clear juice (cranberry or apple), 4 glucose tablets, OR glucose gel. Recheck blood sugar in 15 minutes after treatment (to make sure it is greater than 70 mg/dL). If your blood sugar is not greater than 70 mg/dL on recheck, call [phone] o  for further instructions. . Report your blood sugar to the short stay nurse when you get to Short Stay.  . If you are admitted to the hospital after surgery: o Your blood sugar will be checked by the staff and you will probably be given insulin after surgery (instead of oral diabetes medicines) to make sure you have good blood sugar levels. o The goal for blood sugar control after surgery is 80-180 mg/dL.               Do not wear jewelry, make-up or nail polish.  Do not wear lotions, powders, or perfumes, or deodorant.  Do not shave 48 hours prior to surgery.  Men may shave face and neck.  Do not bring valuables to the hospital.  [facility] is not responsible for any belongings or valuables.  Contacts, dentures or bridgework may not be worn into surgery.  Leave your suitcase in the car.  After surgery it  may be brought to your room.  For patients admitted to the hospital, discharge time will be determined by your treatment team.  Patients discharged the day of surgery will not be allowed to drive home.    Special instructions:    [place]- Preparing For Surgery  Before surgery, you can play an important role. Because skin is not sterile, your skin needs to be as free of germs as possible. You can reduce the number of germs on your skin by washing with CHG (chlorahexidine gluconate) Soap before surgery.  CHG is an antiseptic cleaner which kills germs and bonds with the skin to continue killing germs even after washing.    Oral Hygiene is also  important to reduce your risk of infection.  Remember - BRUSH YOUR TEETH THE MORNING OF SURGERY WITH YOUR REGULAR TOOTHPASTE  Please do not use if you have an allergy to CHG or antibacterial soaps. If your skin becomes reddened/irritated stop using the CHG.  Do not shave (including legs and underarms) for at least 48 hours prior to first CHG shower. It is OK to shave your face.  Please follow these instructions carefully.   1. Shower the NIGHT BEFORE SURGERY and the MORNING OF SURGERY with CHG.   2. If you chose to wash your hair, wash your hair first as usual with your normal shampoo.  3. After you shampoo, rinse your hair and body thoroughly to remove the shampoo.  4. Use CHG as you would any other liquid soap. You can apply CHG directly to the skin and wash gently with a scrungie or a clean washcloth.   5. Apply the CHG Soap to your body ONLY FROM THE NECK DOWN.  Do not use on open wounds or open sores. Avoid contact with your eyes, ears, mouth and genitals (private parts). Wash Face and genitals (private parts)  with your normal soap.  6. Wash thoroughly, paying special attention to the area where your surgery will be performed.  7. Thoroughly rinse your body with warm water from the neck down.  8. DO NOT shower/wash with your normal soap after using and rinsing off the CHG Soap.  9. Pat yourself dry with a CLEAN TOWEL.  10. Wear CLEAN PAJAMAS to bed the night before surgery, wear comfortable clothes the morning of surgery  11. Place CLEAN SHEETS on your bed the night of your first shower and DO NOT SLEEP WITH PETS.    Day of Surgery:  Do not apply any deodorants/lotions.  Please wear clean clothes to the hospital/surgery center.   Remember to brush your teeth WITH YOUR REGULAR TOOTHPASTE.    Please read over the following fact sheets that you were given. Coughing and Deep Breathing and Surgical Site Infection Prevention

## 2018-10-30 ENCOUNTER — Encounter (HOSPITAL_COMMUNITY): Payer: Self-pay

## 2018-10-30 ENCOUNTER — Encounter (HOSPITAL_COMMUNITY)
Admission: RE | Admit: 2018-10-30 | Discharge: 2018-10-30 | Disposition: A | Discharge status: Home / Self Care | Payer: Managed Care, Other (non HMO) | Source: Ambulatory Visit | Attending: General Surgery | Admitting: General Surgery

## 2018-10-30 ENCOUNTER — Other Ambulatory Visit: Payer: Self-pay

## 2018-10-30 DIAGNOSIS — Z01818 Encounter for other preprocedural examination: Secondary | ICD-10-CM | POA: Diagnosis present

## 2018-10-30 DIAGNOSIS — K439 Ventral hernia without obstruction or gangrene: Secondary | ICD-10-CM | POA: Insufficient documentation

## 2018-10-30 LAB — CBC
HEMATOCRIT: 37.9 % (ref 36.0–46.0)
HEMOGLOBIN: 12 g/dL (ref 12.0–15.0)
MCH: 25.5 pg — AB (ref 26.0–34.0)
MCHC: 31.7 g/dL (ref 30.0–36.0)
MCV: 80.5 fL (ref 80.0–100.0)
Platelets: 355 10*3/uL (ref 150–400)
RBC: 4.71 MIL/uL (ref 3.87–5.11)
RDW: 13.2 % (ref 11.5–15.5)
WBC: 10.5 10*3/uL (ref 4.0–10.5)
nRBC: 0 % (ref 0.0–0.2)

## 2018-10-30 LAB — GLUCOSE, CAPILLARY: Glucose-Capillary: 131 mg/dL — ABNORMAL HIGH (ref 70–99)

## 2018-10-30 LAB — BASIC METABOLIC PANEL
ANION GAP: 8 (ref 5–15)
BUN: 11 mg/dL (ref 6–20)
CALCIUM: 8.9 mg/dL (ref 8.9–10.3)
CHLORIDE: 105 mmol/L (ref 98–111)
CO2: 22 mmol/L (ref 22–32)
Creatinine, Ser: 0.57 mg/dL (ref 0.44–1.00)
GFR calc non Af Amer: >60 mL/min (ref >60)
Glucose, Bld: 136 mg/dL — ABNORMAL HIGH (ref 70–99)
Potassium: 4.1 mmol/L (ref 3.5–5.1)
Sodium: 135 mmol/L (ref 135–145)

## 2018-10-30 LAB — HEMOGLOBIN A1C
Hgb A1c MFr Bld: 5.8 % — ABNORMAL HIGH (ref 4.8–5.6)
Mean Plasma Glucose: 119.76 mg/dL

## 2018-10-30 MED ORDER — CHLORHEXIDINE GLUCONATE CLOTH 2 % EX PADS: 6.0000 | MEDICATED_PAD | Freq: Once | CUTANEOUS | Status: DC

## 2018-10-30 NOTE — Progress Notes (Addendum)
PCP: Lujean Amel, MD  Cardiologist: pt denies  EKG: 10/30/18  In EPIC  Stress test: pt denies  ECHO: 04/16/17 in EPIC  Cardiac Cath: pt denies  Chest x-ray: denies past year, no recent respiratory infections/complications

## 2018-11-06 MED ORDER — VANCOMYCIN HCL 10 G IV SOLR
1500.0000 mg | INTRAVENOUS | Status: AC
  Administered 2018-11-07: 1500 mg via INTRAVENOUS
  Filled 2018-11-06: qty 1500

## 2018-11-07 ENCOUNTER — Ambulatory Visit (HOSPITAL_COMMUNITY): Payer: Managed Care, Other (non HMO) | Admitting: Anesthesiology

## 2018-11-07 ENCOUNTER — Encounter (HOSPITAL_COMMUNITY)
Admission: RE | Disposition: A | Discharge status: Home / Self Care | Payer: Self-pay | Source: Ambulatory Visit | Attending: General Surgery

## 2018-11-07 ENCOUNTER — Ambulatory Visit (HOSPITAL_COMMUNITY)
Admission: RE | Admit: 2018-11-07 | Discharge: 2018-11-07 | Disposition: A | Discharge status: Home / Self Care | Payer: Managed Care, Other (non HMO) | Source: Ambulatory Visit | Attending: General Surgery | Admitting: General Surgery

## 2018-11-07 ENCOUNTER — Encounter (HOSPITAL_COMMUNITY): Payer: Self-pay | Admitting: *Deleted

## 2018-11-07 DIAGNOSIS — R109 Unspecified abdominal pain: Secondary | ICD-10-CM | POA: Insufficient documentation

## 2018-11-07 DIAGNOSIS — Z7984 Long term (current) use of oral hypoglycemic drugs: Secondary | ICD-10-CM | POA: Diagnosis not present

## 2018-11-07 DIAGNOSIS — X58XXXA Exposure to other specified factors, initial encounter: Secondary | ICD-10-CM | POA: Insufficient documentation

## 2018-11-07 DIAGNOSIS — E119 Type 2 diabetes mellitus without complications: Secondary | ICD-10-CM | POA: Insufficient documentation

## 2018-11-07 DIAGNOSIS — K660 Peritoneal adhesions (postprocedural) (postinfection): Secondary | ICD-10-CM | POA: Diagnosis not present

## 2018-11-07 DIAGNOSIS — T81590A Other complications of foreign body accidentally left in body following surgical operation, initial encounter: Secondary | ICD-10-CM | POA: Diagnosis not present

## 2018-11-07 DIAGNOSIS — T8189XA Other complications of procedures, not elsewhere classified, initial encounter: Secondary | ICD-10-CM | POA: Insufficient documentation

## 2018-11-07 DIAGNOSIS — Z87891 Personal history of nicotine dependence: Secondary | ICD-10-CM | POA: Insufficient documentation

## 2018-11-07 DIAGNOSIS — Z79899 Other long term (current) drug therapy: Secondary | ICD-10-CM | POA: Insufficient documentation

## 2018-11-07 HISTORY — PX: LAPAROSCOPY: SHX197

## 2018-11-07 HISTORY — PX: FOREIGN BODY REMOVAL ABDOMINAL: SHX5319

## 2018-11-07 LAB — POCT PREGNANCY, URINE: Preg Test, Ur: NEGATIVE

## 2018-11-07 LAB — GLUCOSE, CAPILLARY
GLUCOSE-CAPILLARY: 139 mg/dL — AB (ref 70–99)
Glucose-Capillary: 152 mg/dL — ABNORMAL HIGH (ref 70–99)

## 2018-11-07 SURGERY — LAPAROSCOPY, DIAGNOSTIC
Anesthesia: General | Site: Abdomen

## 2018-11-07 MED ORDER — EPHEDRINE 5 MG/ML INJ
INTRAVENOUS | Status: AC
  Filled 2018-11-07: qty 10

## 2018-11-07 MED ORDER — BUPIVACAINE HCL 0.25 % IJ SOLN
INTRAMUSCULAR | Status: DC | PRN
  Administered 2018-11-07: 3 mL

## 2018-11-07 MED ORDER — TRAMADOL HCL 50 MG PO TABS: 50.0000 mg | ORAL_TABLET | Freq: Four times a day (QID) | ORAL | 0 refills | Status: DC | PRN

## 2018-11-07 MED ORDER — GABAPENTIN 300 MG PO CAPS
300.0000 mg | ORAL_CAPSULE | ORAL | Status: AC
  Administered 2018-11-07: 300 mg via ORAL
  Filled 2018-11-07: qty 1

## 2018-11-07 MED ORDER — OXYCODONE HCL 5 MG/5ML PO SOLN: 5.0000 mg | Freq: Once | ORAL | Status: AC | PRN

## 2018-11-07 MED ORDER — PROPOFOL 10 MG/ML IV BOLUS
INTRAVENOUS | Status: AC
  Filled 2018-11-07: qty 20

## 2018-11-07 MED ORDER — MIDAZOLAM HCL 5 MG/5ML IJ SOLN
INTRAMUSCULAR | Status: DC | PRN
  Administered 2018-11-07: 2 mg via INTRAVENOUS

## 2018-11-07 MED ORDER — LIDOCAINE 2% (20 MG/ML) 5 ML SYRINGE
INTRAMUSCULAR | Status: AC
  Filled 2018-11-07: qty 5

## 2018-11-07 MED ORDER — DEXAMETHASONE SODIUM PHOSPHATE 10 MG/ML IJ SOLN
INTRAMUSCULAR | Status: DC | PRN
  Administered 2018-11-07: 8 mg via INTRAVENOUS

## 2018-11-07 MED ORDER — FENTANYL CITRATE (PF) 250 MCG/5ML IJ SOLN
INTRAMUSCULAR | Status: AC
  Filled 2018-11-07: qty 5

## 2018-11-07 MED ORDER — ONDANSETRON HCL 4 MG/2ML IJ SOLN: 4.0000 mg | Freq: Once | INTRAMUSCULAR | Status: DC | PRN

## 2018-11-07 MED ORDER — BUPIVACAINE HCL (PF) 0.25 % IJ SOLN
INTRAMUSCULAR | Status: AC
  Filled 2018-11-07: qty 30

## 2018-11-07 MED ORDER — ONDANSETRON HCL 4 MG/2ML IJ SOLN
INTRAMUSCULAR | Status: AC
  Filled 2018-11-07: qty 2

## 2018-11-07 MED ORDER — DEXAMETHASONE SODIUM PHOSPHATE 10 MG/ML IJ SOLN
INTRAMUSCULAR | Status: AC
  Filled 2018-11-07: qty 1

## 2018-11-07 MED ORDER — ACETAMINOPHEN 500 MG PO TABS
1000.0000 mg | ORAL_TABLET | ORAL | Status: AC
  Administered 2018-11-07: 1000 mg via ORAL
  Filled 2018-11-07: qty 2

## 2018-11-07 MED ORDER — PHENYLEPHRINE 40 MCG/ML (10ML) SYRINGE FOR IV PUSH (FOR BLOOD PRESSURE SUPPORT)
PREFILLED_SYRINGE | INTRAVENOUS | Status: AC
  Filled 2018-11-07: qty 10

## 2018-11-07 MED ORDER — SODIUM CHLORIDE 0.9 % IR SOLN
Status: DC | PRN
  Administered 2018-11-07: 1000 mL

## 2018-11-07 MED ORDER — SUCCINYLCHOLINE CHLORIDE 200 MG/10ML IV SOSY
PREFILLED_SYRINGE | INTRAVENOUS | Status: AC
  Filled 2018-11-07: qty 10

## 2018-11-07 MED ORDER — ESMOLOL HCL 100 MG/10ML IV SOLN
INTRAVENOUS | Status: AC
  Filled 2018-11-07: qty 10

## 2018-11-07 MED ORDER — FENTANYL CITRATE (PF) 250 MCG/5ML IJ SOLN
INTRAMUSCULAR | Status: DC | PRN
  Administered 2018-11-07 (×5): 50 ug via INTRAVENOUS
  Administered 2018-11-07: 100 ug via INTRAVENOUS

## 2018-11-07 MED ORDER — LIDOCAINE 2% (20 MG/ML) 5 ML SYRINGE
INTRAMUSCULAR | Status: DC | PRN
  Administered 2018-11-07: 60 mg via INTRAVENOUS

## 2018-11-07 MED ORDER — FENTANYL CITRATE (PF) 100 MCG/2ML IJ SOLN
INTRAMUSCULAR | Status: AC
  Filled 2018-11-07: qty 2

## 2018-11-07 MED ORDER — 0.9 % SODIUM CHLORIDE (POUR BTL) OPTIME
TOPICAL | Status: DC | PRN
  Administered 2018-11-07: 1000 mL

## 2018-11-07 MED ORDER — OXYCODONE HCL 5 MG PO TABS
5.0000 mg | ORAL_TABLET | Freq: Once | ORAL | Status: AC | PRN
  Administered 2018-11-07: 5 mg via ORAL

## 2018-11-07 MED ORDER — FENTANYL CITRATE (PF) 100 MCG/2ML IJ SOLN
25.0000 ug | INTRAMUSCULAR | Status: DC | PRN
  Administered 2018-11-07: 50 ug via INTRAVENOUS

## 2018-11-07 MED ORDER — CELECOXIB 200 MG PO CAPS
200.0000 mg | ORAL_CAPSULE | ORAL | Status: AC
  Administered 2018-11-07: 200 mg via ORAL
  Filled 2018-11-07: qty 1

## 2018-11-07 MED ORDER — MIDAZOLAM HCL 2 MG/2ML IJ SOLN
INTRAMUSCULAR | Status: AC
  Filled 2018-11-07: qty 2

## 2018-11-07 MED ORDER — LACTATED RINGERS IV SOLN
INTRAVENOUS | Status: DC
  Administered 2018-11-07: 09:00:00 via INTRAVENOUS

## 2018-11-07 MED ORDER — ONDANSETRON HCL 4 MG/2ML IJ SOLN
INTRAMUSCULAR | Status: DC | PRN
  Administered 2018-11-07: 4 mg via INTRAVENOUS

## 2018-11-07 MED ORDER — OXYCODONE HCL 5 MG PO TABS
ORAL_TABLET | ORAL | Status: AC
  Filled 2018-11-07: qty 1

## 2018-11-07 MED ORDER — PROPOFOL 10 MG/ML IV BOLUS
INTRAVENOUS | Status: DC | PRN
  Administered 2018-11-07: 170 mg via INTRAVENOUS
  Administered 2018-11-07: 30 mg via INTRAVENOUS

## 2018-11-07 MED ORDER — ROCURONIUM BROMIDE 50 MG/5ML IV SOSY
PREFILLED_SYRINGE | INTRAVENOUS | Status: AC
  Filled 2018-11-07: qty 5

## 2018-11-07 MED ORDER — ROCURONIUM BROMIDE 10 MG/ML (PF) SYRINGE
PREFILLED_SYRINGE | INTRAVENOUS | Status: DC | PRN
  Administered 2018-11-07: 20 mg via INTRAVENOUS
  Administered 2018-11-07: 50 mg via INTRAVENOUS

## 2018-11-07 MED ORDER — ESMOLOL HCL 100 MG/10ML IV SOLN
INTRAVENOUS | Status: DC | PRN
  Administered 2018-11-07: 10 mg via INTRAVENOUS

## 2018-11-07 MED ORDER — SUGAMMADEX SODIUM 200 MG/2ML IV SOLN
INTRAVENOUS | Status: DC | PRN
  Administered 2018-11-07: 200 mg via INTRAVENOUS

## 2018-11-07 SURGICAL SUPPLY — 59 items
ADH SKN CLS APL DERMABOND .7 (GAUZE/BANDAGES/DRESSINGS) ×2
APL SKNCLS STERI-STRIP NONHPOA (GAUZE/BANDAGES/DRESSINGS)
APPLIER CLIP LOGIC TI 5 (MISCELLANEOUS) IMPLANT
APR CLP MED LRG 33X5 (MISCELLANEOUS)
BENZOIN TINCTURE PRP APPL 2/3 (GAUZE/BANDAGES/DRESSINGS) ×1 IMPLANT
BLADE CLIPPER SURG (BLADE) ×3 IMPLANT
CANISTER SUCT 3000ML PPV (MISCELLANEOUS) IMPLANT
CHLORAPREP W/TINT 26ML (MISCELLANEOUS) ×4 IMPLANT
COVER SURGICAL LIGHT HANDLE (MISCELLANEOUS) ×4 IMPLANT
COVER WAND RF STERILE (DRAPES) ×1 IMPLANT
DEFOGGER SCOPE WARMER CLEARIFY (MISCELLANEOUS) ×1 IMPLANT
DERMABOND ADVANCED (GAUZE/BANDAGES/DRESSINGS) ×2
DERMABOND ADVANCED .7 DNX12 (GAUZE/BANDAGES/DRESSINGS) ×2 IMPLANT
DEVICE SECURE STRAP 25 ABSORB (INSTRUMENTS) ×1 IMPLANT
DRAPE WARM FLUID 44X44 (DRAPE) ×1 IMPLANT
ELECT REM PT RETURN 9FT ADLT (ELECTROSURGICAL) ×4
ELECTRODE REM PT RTRN 9FT ADLT (ELECTROSURGICAL) ×2 IMPLANT
GAUZE SPONGE 2X2 8PLY STRL LF (GAUZE/BANDAGES/DRESSINGS) ×1 IMPLANT
GLOVE BIO SURGEON STRL SZ7.5 (GLOVE) ×4 IMPLANT
GOWN STRL REUS W/ TWL LRG LVL3 (GOWN DISPOSABLE) ×4 IMPLANT
GOWN STRL REUS W/ TWL XL LVL3 (GOWN DISPOSABLE) ×2 IMPLANT
GOWN STRL REUS W/TWL LRG LVL3 (GOWN DISPOSABLE) ×8
GOWN STRL REUS W/TWL XL LVL3 (GOWN DISPOSABLE) ×4
GRASPER SUT TROCAR 14GX15 (MISCELLANEOUS) ×1 IMPLANT
KIT BASIN OR (CUSTOM PROCEDURE TRAY) ×4 IMPLANT
KIT TURNOVER KIT B (KITS) ×4 IMPLANT
MARKER SKIN DUAL TIP RULER LAB (MISCELLANEOUS) ×4 IMPLANT
NDL INSUFFLATION 14GA 120MM (NEEDLE) ×1 IMPLANT
NDL SPNL 22GX3.5 QUINCKE BK (NEEDLE) IMPLANT
NEEDLE INSUFFLATION 14GA 120MM (NEEDLE) ×4 IMPLANT
NEEDLE SPNL 22GX3.5 QUINCKE BK (NEEDLE) IMPLANT
NS IRRIG 1000ML POUR BTL (IV SOLUTION) ×4 IMPLANT
PAD ARMBOARD 7.5X6 YLW CONV (MISCELLANEOUS) ×8 IMPLANT
PENCIL BUTTON HOLSTER BLD 10FT (ELECTRODE) ×3 IMPLANT
POUCH LAPAROSCOPIC INSTRUMENT (MISCELLANEOUS) IMPLANT
SCISSORS LAP 5X35 DISP (ENDOMECHANICALS) ×4 IMPLANT
SET IRRIG TUBING LAPAROSCOPIC (IRRIGATION / IRRIGATOR) ×3 IMPLANT
SHEARS HARMONIC ACE PLUS 36CM (ENDOMECHANICALS) IMPLANT
SLEEVE ENDOPATH XCEL 5M (ENDOMECHANICALS) ×4 IMPLANT
SPONGE GAUZE 2X2 STER 10/PKG (GAUZE/BANDAGES/DRESSINGS)
SUT CHROMIC 2 0 SH (SUTURE) ×1 IMPLANT
SUT ETHIBOND CT1 BRD #0 30IN (SUTURE) IMPLANT
SUT MNCRL AB 3-0 PS2 18 (SUTURE) ×4 IMPLANT
SUT MNCRL AB 4-0 PS2 18 (SUTURE) ×4 IMPLANT
SUT NOVA NAB DX-16 0-1 5-0 T12 (SUTURE) IMPLANT
SUT PROLENE 2 0 KS (SUTURE) IMPLANT
SUT SILK 3 0 SH 30 (SUTURE) IMPLANT
SUT VIC AB 2-0 SH 27 (SUTURE) ×4
SUT VIC AB 2-0 SH 27XBRD (SUTURE) ×1 IMPLANT
TOWEL OR 17X24 6PK STRL BLUE (TOWEL DISPOSABLE) ×4 IMPLANT
TOWEL OR 17X26 10 PK STRL BLUE (TOWEL DISPOSABLE) ×4 IMPLANT
TRAY FOLEY CATH SILVER 16FR (SET/KITS/TRAYS/PACK) IMPLANT
TRAY LAPAROSCOPIC MC (CUSTOM PROCEDURE TRAY) ×4 IMPLANT
TROCAR XCEL 12X100 BLDLESS (ENDOMECHANICALS) IMPLANT
TROCAR XCEL BLUNT TIP 100MML (ENDOMECHANICALS) IMPLANT
TROCAR XCEL NON-BLD 11X100MML (ENDOMECHANICALS) IMPLANT
TROCAR XCEL NON-BLD 5MMX100MML (ENDOMECHANICALS) ×4 IMPLANT
TUBING INSUFFLATION (TUBING) ×4 IMPLANT
WATER STERILE IRR 1000ML POUR (IV SOLUTION) ×4 IMPLANT

## 2018-11-07 NOTE — Transfer of Care (Signed)
Immediate Anesthesia Transfer of Care Note  Patient: Denise Barnett  Procedure(s) Performed: LAPAROSCOPY DIAGNOSTIC (N/A Abdomen) REMOVAL FOREIGN BODY ABDOMINAL (N/A Abdomen)  Patient Location: PACU  Anesthesia Type:General  Level of Consciousness: awake, alert  and oriented  Airway & Oxygen Therapy: Patient Spontanous Breathing  Post-op Assessment: Report given to RN and Post -op Vital signs reviewed and stable  Post vital signs: Reviewed and stable  Last Vitals:  Vitals Value Taken Time  BP 149/90 11/07/2018 10:46 AM  Temp    Pulse 119 11/07/2018 10:48 AM  Resp 11 11/07/2018 10:48 AM  SpO2 91 % 11/07/2018 10:48 AM  Vitals shown include unvalidated device data.  Last Pain:  Vitals:   11/07/18 0813  TempSrc:   PainSc: 0-No pain         Complications: No apparent anesthesia complications

## 2018-11-07 NOTE — Anesthesia Preprocedure Evaluation (Signed)
Anesthesia Evaluation  Patient identified by MRN, date of birth, ID band Patient awake    Reviewed: Allergy & Precautions, NPO status , Patient's Chart, lab work & pertinent test results  Airway Mallampati: III  TM Distance: >3 FB Neck ROM: Full    Dental  (+) Teeth Intact, Dental Advisory Given   Pulmonary former smoker,    breath sounds clear to auscultation       Cardiovascular  Rhythm:Regular Rate:Normal     Neuro/Psych    GI/Hepatic   Endo/Other  diabetes  Renal/GU      Musculoskeletal   Abdominal   Peds  Hematology   Anesthesia Other Findings   Reproductive/Obstetrics                             Anesthesia Physical Anesthesia Plan  ASA: III  Anesthesia Plan: General   Post-op Pain Management:    Induction: Intravenous  PONV Risk Score and Plan: Ondansetron and Metaclopromide  Airway Management Planned: Oral ETT  Additional Equipment:   Intra-op Plan:   Post-operative Plan: Extubation in OR  Informed Consent: I have reviewed the patients History and Physical, chart, labs and discussed the procedure including the risks, benefits and alternatives for the proposed anesthesia with the patient or authorized representative who has indicated his/her understanding and acceptance.   Dental advisory given  Plan Discussed with: CRNA and Anesthesiologist  Anesthesia Plan Comments:         Anesthesia Quick Evaluation

## 2018-11-07 NOTE — Op Note (Signed)
11/07/2018  10:33 AM  PATIENT:  Denise Barnett  47 y.o. female  PRE-OPERATIVE DIAGNOSIS:  Abdominal pain  POST-OPERATIVE DIAGNOSIS:  DIAGNOSTIC LAPAROSCOPY, REMOVAL OR FOREIGN BODY (stitch)  PROCEDURE:  Procedure(s): LAPAROSCOPY DIAGNOSTIC (N/A) REMOVAL FOREIGN BODY ABDOMINAL (N/A)  SURGEON:  Surgeon(s) and Role:    Ralene Ok, MD - Primary  ANESTHESIA:   local and general  EBL:  20 mL   BLOOD ADMINISTERED:none  DRAINS: none   LOCAL MEDICATIONS USED:  BUPIVICAINE   SPECIMEN:  No Specimen  DISPOSITION OF SPECIMEN:  N/A  COUNTS:  YES  TOURNIQUET:  * No tourniquets in log *  DICTATION: .Dragon Dictation Indication procedure: Patient is a 47 year old female who previously underwent laparoscopic ventral hernia repair with mesh, trans-fascial suture placement.  Patient was seen in clinic and had significant pain to the right lower quadrant area where previous trans-fascial stitch was placed.  Secondary to this patient was taken to the operating for diagnostic laparoscopy and removal of stitch.  Details of procedure: After the patient was consented she was taken back to the OR and placed in supine position with bilateral SCDs in place.  Patient underwent general endotracheal anesthesia.  Patient was then prepped and draped sterile fashion.  Time was called all facts were verified.  A Veress needle technique was used to insufflate the abdomen to 15 m of mercury in the left subcostal margin.  Subsequent to this a 5 mm trocar and camera placed intra-abdominally.  There is no injury to intra-abdominal organs.  There is large amount of omentum adherent to the posterior portion of the central mesh.  At this time a 5 mm trocar was then placed in the left lower quadrant under direct visualization.  Using blunt dissection the omentum was then taken down from the mesh.  There was also some small bowel loops adhered to the posterior side of the mesh.  These were taken down with both  sharp and blunt dissection carefully.  Once these were all taken down the mass could easily be seen.  There was no hernia at the mesh.  There appeared to be eventration in the most inferior section of the mesh in the abdominal wall.  There is no overt hernia seen here.  At this time the 3:00 and 9:00 stitches were soft.  The 3:00 stitch was easily seen and removed.  The 12:00 stitch/Prolene was also removed.  I proceeded to search for the 9:00 stitch over this could not be seen.  At this time an incision was made over the area externally on the skin.  Blunt dissection was taken down with the palpable dissection could easily find this 9:00 stitch.  This was grasped and removed.  There was an area in the midline of the previous stitch reapproximation of the hernia had a granuloma.  This was excised in elliptical incision.  The cyst was also removed here.  At this time the omentum was brought over the area of the mesh.  Insufflation was evacuated.  All incision sites were then reapproximated using 2-0 Vicryl's in interrupted fashion.  The skin was reapproximated all incision sites using 4-0 Monocryl subcuticular fashion.  The patient taught the procedure well was taken to the recovery room in stable condition.    PLAN OF CARE: Discharge to home after PACU  PATIENT DISPOSITION:  PACU - hemodynamically stable.   Delay start of Pharmacological VTE agent (>24hrs) due to surgical blood loss or risk of bleeding: not applicable

## 2018-11-07 NOTE — Anesthesia Postprocedure Evaluation (Signed)
Anesthesia Post Note  Patient: DYAN LABARBERA  Procedure(s) Performed: LAPAROSCOPY DIAGNOSTIC (N/A Abdomen) REMOVAL FOREIGN BODY ABDOMINAL (N/A Abdomen)     Patient location during evaluation: PACU Anesthesia Type: General Level of consciousness: awake and alert Pain management: pain level controlled Vital Signs Assessment: post-procedure vital signs reviewed and stable Respiratory status: spontaneous breathing, nonlabored ventilation, respiratory function stable and patient connected to nasal cannula oxygen Cardiovascular status: blood pressure returned to baseline and stable Postop Assessment: no apparent nausea or vomiting Anesthetic complications: no    Last Vitals:  Vitals:   11/07/18 1145 11/07/18 1200  BP: 109/73 117/70  Pulse: 94 92  Resp: 10 13  Temp: 36.6 C   SpO2: 95% 94%    Last Pain:  Vitals:   11/07/18 1145  TempSrc:   PainSc: 3                  Jocob Dambach COKER

## 2018-11-07 NOTE — Discharge Instructions (Signed)
Diagnostic Laparoscopy, Care After  Refer to this sheet in the next few weeks. These instructions provide you with information about caring for yourself after your procedure. Your health care provider may also give you more specific instructions. Your treatment has been planned according to current medical practices, but problems sometimes occur. Call your health care provider if you have any problems or questions after your procedure.  What can I expect after the procedure?  After your procedure, it is common to have mild discomfort in the throat and abdomen.  Follow these instructions at home:  · Take over-the-counter and prescription medicines only as told by your health care provider.  · Do not drive for 24 hours if you received a sedative.  · Return to your normal activities as told by your health care provider.  · Do not take baths, swim, or use a hot tub until your health care provider approves. You may shower.  · Follow instructions from your health care provider about how to take care of your incision. Make sure you:  ? Wash your hands with soap and water before you change your bandage (dressing). If soap and water are not available, use hand sanitizer.  ? Change your dressing as told by your health care provider.  ? Leave stitches (sutures), skin glue, or adhesive strips in place. These skin closures may need to stay in place for 2 weeks or longer. If adhesive strip edges start to loosen and curl up, you may trim the loose edges. Do not remove adhesive strips completely unless your health care provider tells you to do that.  · Check your incision area every day for signs of infection. Check for:  ? More redness, swelling, or pain.  ? More fluid or blood.  ? Warmth.  ? Pus or a bad smell.  · It is your responsibility to get the results of your procedure. Ask your health care provider or the department performing the procedure when your results will be ready.  Contact a health care provider if:  · There is  new pain in your shoulders.  · You feel light-headed or faint.  · You are unable to pass gas or unable to have a bowel movement.  · You feel nauseous or you vomit.  · You develop a rash.  · You have more redness, swelling, or pain around your incision.  · You have more fluid or blood coming from your incision.  · Your incision feels warm to the touch.  · You have pus or a bad smell coming from your incision.  · You have a fever or chills.  Get help right away if:  · Your pain is getting worse.  · You have ongoing vomiting.  · The edges of your incision open up.  · You have trouble breathing.  · You have chest pain.  This information is not intended to replace advice given to you by your health care provider. Make sure you discuss any questions you have with your health care provider.  Document Released: 11/10/2015 Document Revised: 05/06/2016 Document Reviewed: 08/12/2015  Elsevier Interactive Patient Education © 2018 [employer]

## 2018-11-07 NOTE — Anesthesia Procedure Notes (Addendum)
Procedure Name: Intubation Date/Time: 11/07/2018 9:28 AM Performed by: Marsa Aris, CRNA Pre-anesthesia Checklist: Patient identified, Emergency Drugs available, Suction available and Patient being monitored Patient Re-evaluated:Patient Re-evaluated prior to induction Oxygen Delivery Method: Circle System Utilized Preoxygenation: Pre-oxygenation with 100% oxygen Induction Type: IV induction Ventilation: Mask ventilation without difficulty and Oral airway inserted - appropriate to patient size Laryngoscope Size: Sabra Heck and 2 Grade View: Grade I Tube type: Oral Number of attempts: 1 Airway Equipment and Method: Stylet and Oral airway Placement Confirmation: ETT inserted through vocal cords under direct vision,  positive ETCO2 and breath sounds checked- equal and bilateral Secured at: 21 cm Tube secured with: Tape Dental Injury: Teeth and Oropharynx as per pre-operative assessment  Comments: No change in dentition from pre-procedure

## 2018-11-07 NOTE — Interval H&P Note (Signed)
History and Physical Interval Note:  11/07/2018 8:34 AM  Denise Barnett  has presented today for surgery, with the diagnosis of S/P HERNIA  The various methods of treatment have been discussed with the patient and family. After consideration of risks, benefits and other options for treatment, the patient has consented to  Procedure(s): LAPAROSCOPY DIAGNOSTIC (N/A) LAPAROSCOPIC POSSIBLE OPEN PARASTOMAL HERNIA REPAIR WITH MESH (N/A) as a surgical intervention .  The patient's history has been reviewed, patient examined, no change in status, stable for surgery.  I have reviewed the patient's chart and labs.  Questions were answered to the patient's satisfaction.     Ralene Ok

## 2018-11-08 ENCOUNTER — Encounter (HOSPITAL_COMMUNITY): Payer: Self-pay | Admitting: General Surgery

## 2019-05-12 DIAGNOSIS — R600 Localized edema: Secondary | ICD-10-CM | POA: Insufficient documentation

## 2019-05-12 DIAGNOSIS — R918 Other nonspecific abnormal finding of lung field: Secondary | ICD-10-CM | POA: Insufficient documentation

## 2019-05-24 ENCOUNTER — Other Ambulatory Visit: Payer: Self-pay | Admitting: Otolaryngology

## 2019-05-24 DIAGNOSIS — R600 Localized edema: Secondary | ICD-10-CM

## 2019-05-25 ENCOUNTER — Other Ambulatory Visit: Payer: Self-pay | Admitting: Otolaryngology

## 2019-05-25 DIAGNOSIS — R918 Other nonspecific abnormal finding of lung field: Secondary | ICD-10-CM

## 2019-06-11 ENCOUNTER — Other Ambulatory Visit: Payer: Self-pay

## 2019-06-11 ENCOUNTER — Ambulatory Visit
Admission: RE | Admit: 2019-06-11 | Discharge: 2019-06-11 | Disposition: A | Discharge status: Home / Self Care | Payer: Managed Care, Other (non HMO) | Source: Ambulatory Visit | Attending: Otolaryngology | Admitting: Otolaryngology

## 2019-06-11 DIAGNOSIS — R918 Other nonspecific abnormal finding of lung field: Secondary | ICD-10-CM

## 2019-06-11 DIAGNOSIS — R600 Localized edema: Secondary | ICD-10-CM

## 2019-06-11 IMAGING — CT CT NECK WITH CONTRAST
3 of 4 series · 7 of 14 positions shown, 8 images · IV contrast (iopamidol)
Comparison: PET-CT [DATE]

CLINICAL DATA: Right parotid swelling for 2 months extending to the
right mandible. No improvement with antibiotic therapy.

EXAM:
CT NECK WITH CONTRAST
TECHNIQUE: Multidetector CT imaging of the neck was performed using the
standard protocol following the bolus administration of intravenous
contrast.
CONTRAST:  75mL [G8] IOPAMIDOL ([G8]) INJECTION 61%

[Series 3: neck · axial · 0.46mm/px · z∈[-241,-121]mm · 3 of 120 slices shown]
[im 30/120  bone]
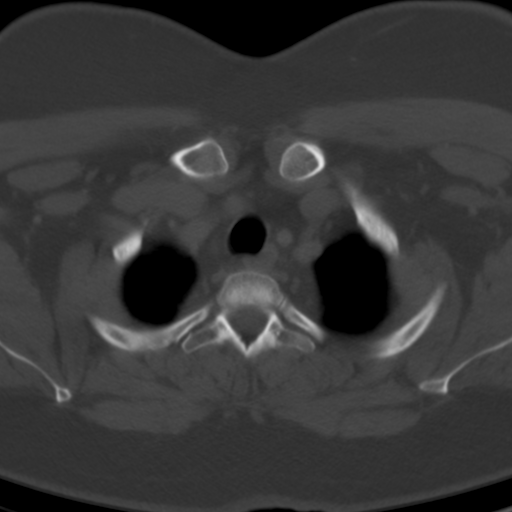
[im 60/120  bone]
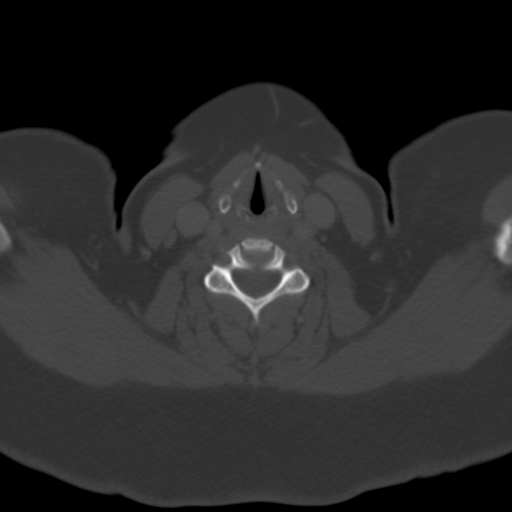
[im 90/120  bone]
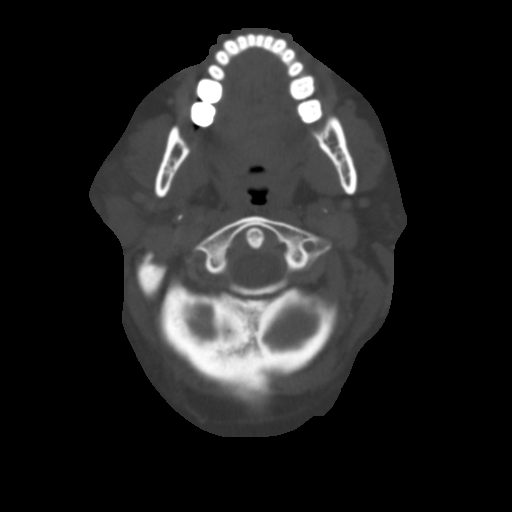

[Series 8: angled axial-oropharynx · axial · 0.39mm/px · z∈[-239,-161]mm · 2 of 120 slices shown, 3 images]
[im 40/120  soft-tissue]
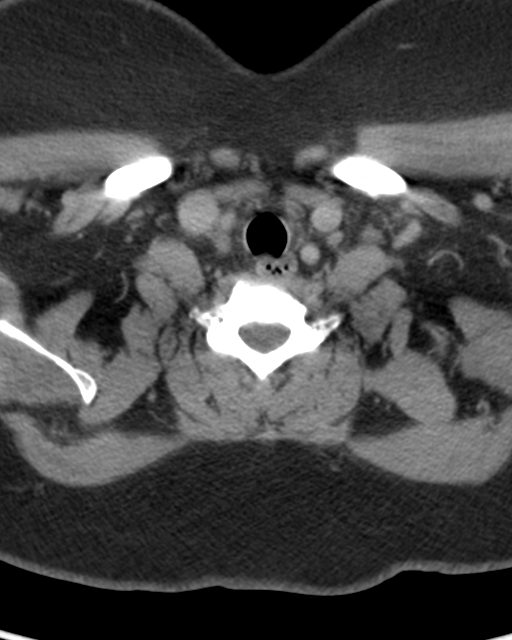
[im 40/120  bone]
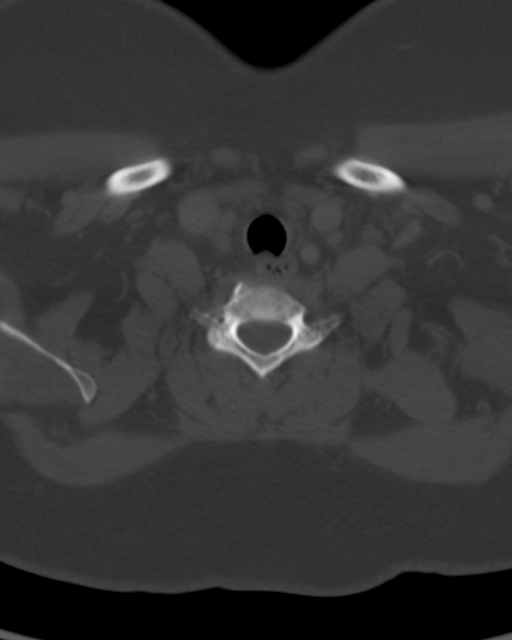
[im 80/120  bone]
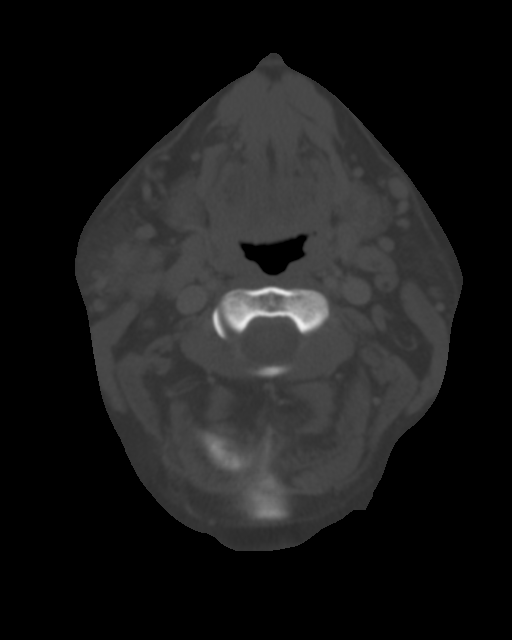

[Series 9: angled (person_name) · axial · 0.44mm/px · z∈[-182,-107]mm · 2 of 114 slices shown]
[im 38/114  bone]
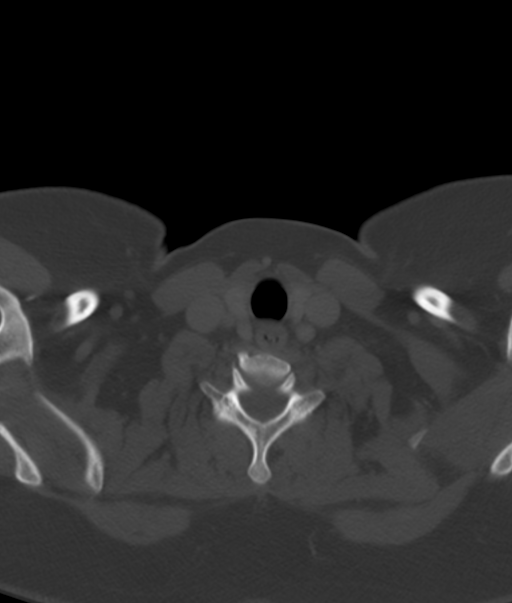
[im 76/114  bone]
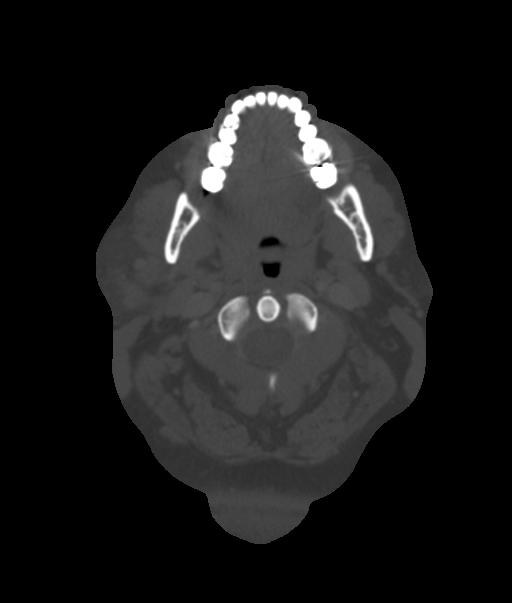

[7 of 14 positions shown; findings below may reference images not displayed]

FINDINGS: Pharynx and larynx: No evidence of mass or swelling. Patent airway.
No retropharyngeal fluid.

Salivary glands: A new soft tissue mass superiorly in the right
parotid gland measures 2.4 x 1.5 cm. Additional smaller soft tissue
masses or lymph nodes are present more inferiorly in the right
parotid gland and within or adjacent to the parotid tail measuring
up to 1.3 cm. There is mild fat stranding extending inferiorly from
the right parotid tail. No salivary calculi or ductal dilatation is
seen. The left parotid gland and both submandibular glands are
unremarkable.

Thyroid: Unremarkable.

Lymph nodes: A mildly enlarged right level II a lymph node
immediately posterior to the submandibular gland measures 11 mm in
short axis. A left level IIa lymph node measures 10 mm in short
axis. Small but mildly prominent level III and IV lymph nodes
bilaterally are larger than on the prior PET-CT though are all well
less than 1 cm in short axis.

Vascular: Mild calcified plaque at the left carotid bifurcation.

Limited intracranial: Unremarkable.

Visualized orbits: Unremarkable.

Mastoids and visualized paranasal sinuses: Opacification of a couple
of posterior right ethmoid air cells. Trace right mastoid fluid.

Skeleton: No acute osseous abnormality or suspicious osseous lesion.

Upper chest: Clear lung apices. Multiple borderline enlarged
mediastinal lymph nodes in the paratracheal and prevascular stations
measuring up to 10 mm in short axis, slightly increased from the
prior PET-CT.

Other: None.
IMPRESSION: Multiple new soft tissue nodules in and adjacent to the right
parotid gland favored to reflect enlarged lymph nodes given
borderline to mild lymphadenopathy elsewhere in the neck and in the
superior mediastinum as well as mild retroperitoneal lymphadenopathy
on [G8] abdominal CTs. The dominant right parotid lesion measures
2.4 cm and an underlying primary parotid neoplasm is not excluded.
Mild stranding adjacent to the right parotid tail suggests a mild
inflammatory component.

## 2019-06-11 IMAGING — CR CHEST - 2 VIEW
2 series · 2 of 2 positions shown · non-contrast
Comparison: [DATE]

CLINICAL DATA: Abnormal CXR w/multiple nodules 2yrs ago / no chest
c/o today / former smoker / h/o PNA, bronchitis, and OSA

EXAM:
CHEST - 2 VIEW

[w chest pa]
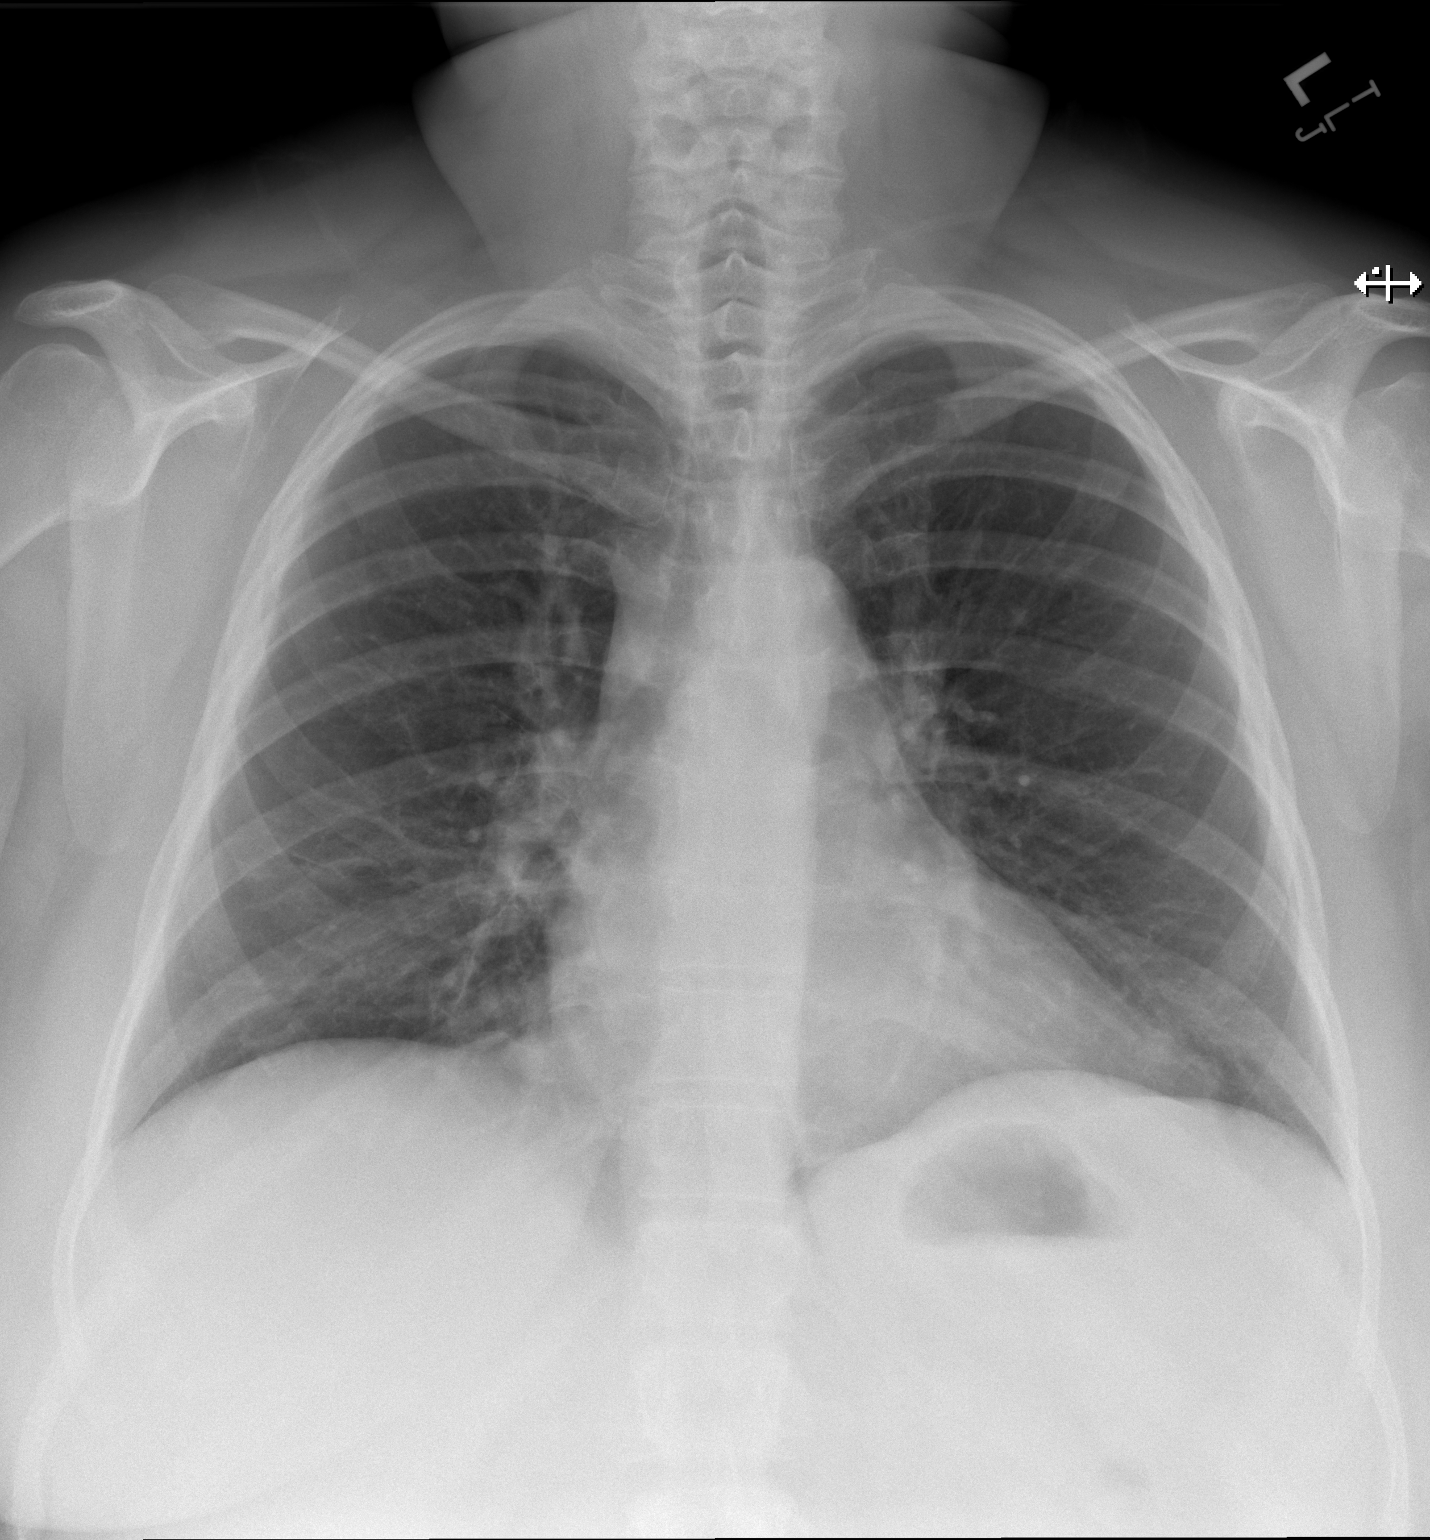

[w chest lat]
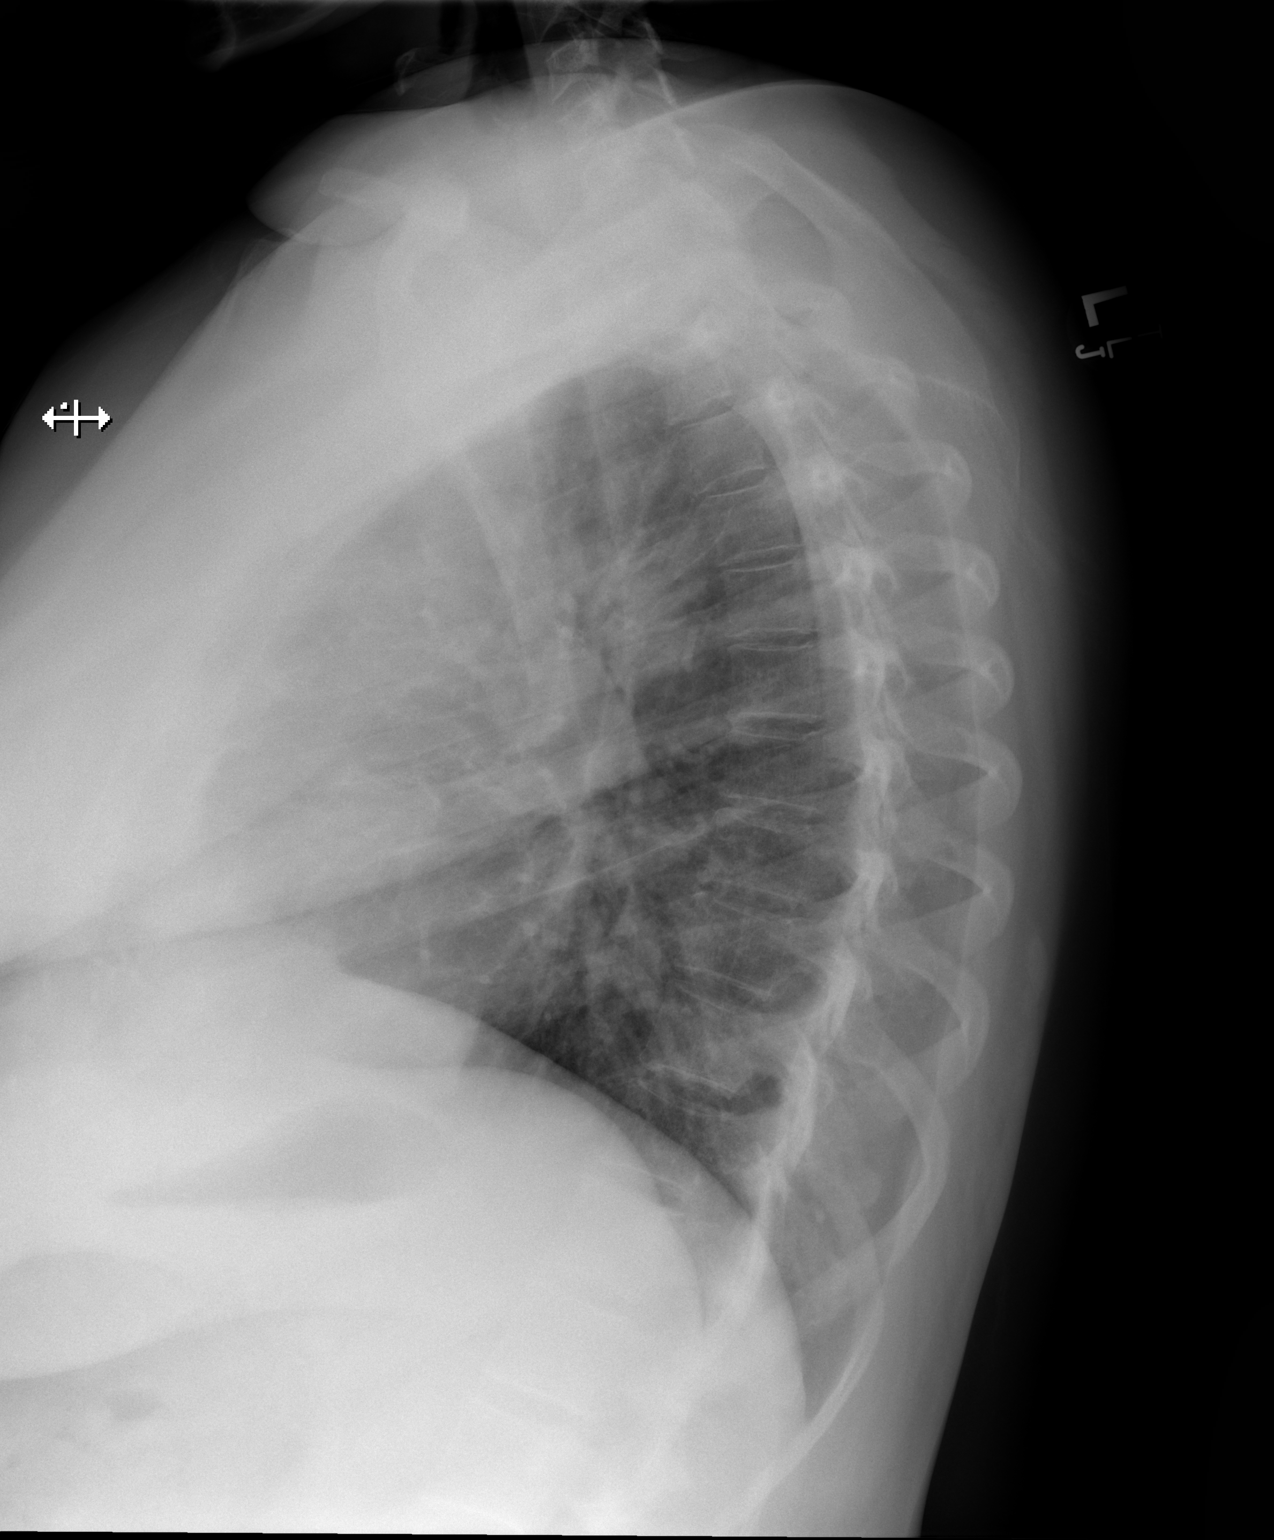

[2 of 2 positions shown; findings below may reference images not displayed]

FINDINGS: Lungs are clear.

Heart size and mediastinal contour normal.  5th

No effusion.

Visualized bones unremarkable.
IMPRESSION: No acute cardiopulmonary disease.

## 2019-06-11 MED ORDER — IOPAMIDOL (ISOVUE-300) INJECTION 61%
75.0000 mL | Freq: Once | INTRAVENOUS | Status: AC | PRN
  Administered 2019-06-11: 09:00:00 75 mL via INTRAVENOUS

## 2019-07-13 ENCOUNTER — Other Ambulatory Visit: Payer: Self-pay | Admitting: Otolaryngology

## 2019-07-13 DIAGNOSIS — R600 Localized edema: Secondary | ICD-10-CM

## 2019-07-20 ENCOUNTER — Other Ambulatory Visit: Payer: Managed Care, Other (non HMO)

## 2019-07-23 ENCOUNTER — Other Ambulatory Visit: Payer: Self-pay | Admitting: Otolaryngology

## 2019-07-23 ENCOUNTER — Ambulatory Visit
Admission: RE | Admit: 2019-07-23 | Discharge: 2019-07-23 | Disposition: A | Discharge status: Home / Self Care | Payer: Managed Care, Other (non HMO) | Source: Ambulatory Visit | Attending: Otolaryngology | Admitting: Otolaryngology

## 2019-07-23 DIAGNOSIS — R600 Localized edema: Secondary | ICD-10-CM

## 2019-07-23 IMAGING — US SOFT TISSUE ULTRASOUND HEAD/NECK
1 series · 14 of 25 positions shown · non-contrast
Comparison: Neck CT [DATE]

CLINICAL DATA: Swelling of right parotid gland.  Nondiagnostic FNA.

EXAM:
ULTRASOUND OF HEAD/NECK SOFT TISSUES
TECHNIQUE: Ultrasound examination of the head and neck soft tissues was
performed in the area of clinical concern.

[Series 1: soft tissue ultrasound head/neck · 0.08mm/px · 14 of 31 slices shown]
[im 1/31]
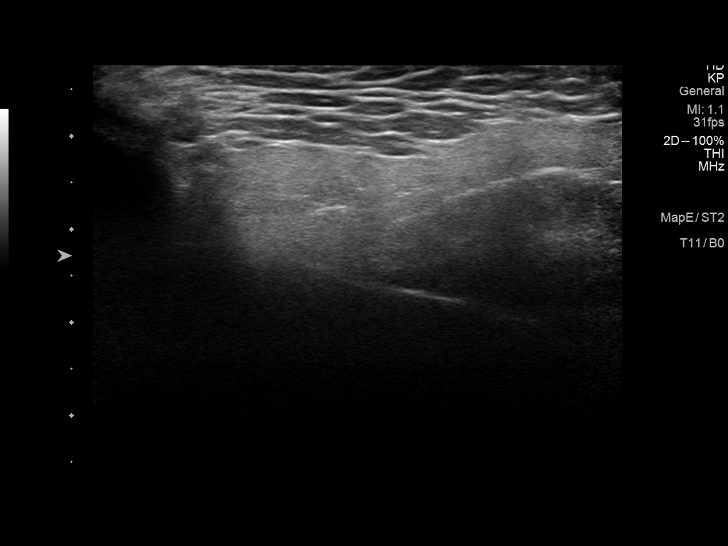
[im 3/31]
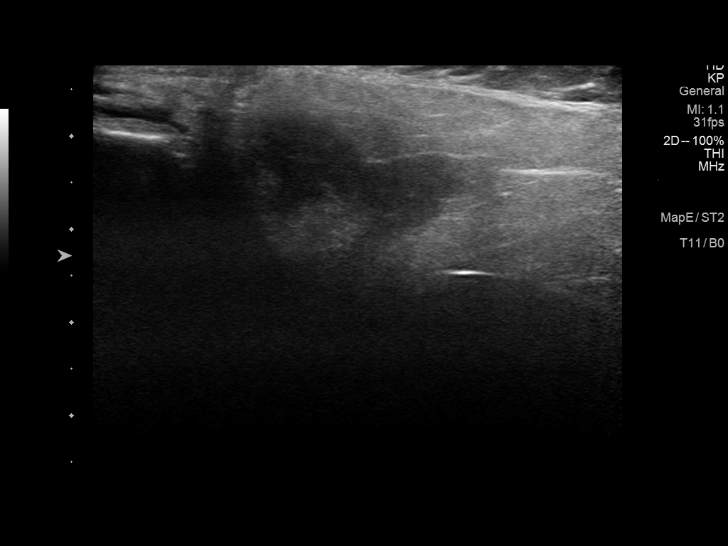
[im 6/31]
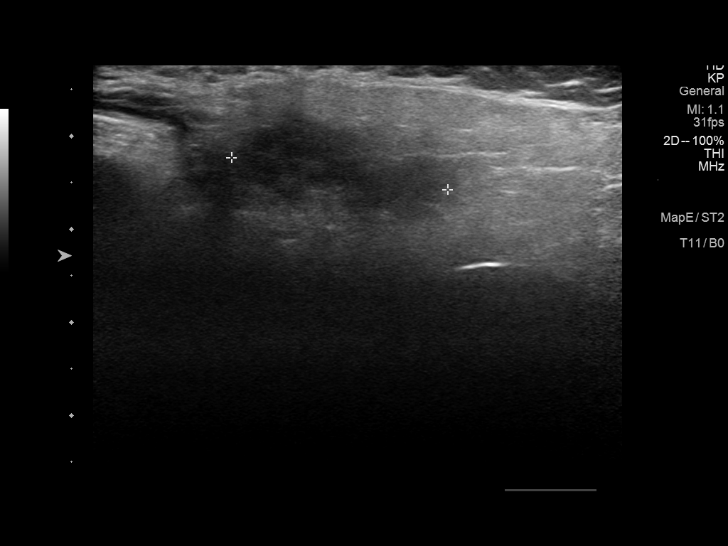
[im 8/31]
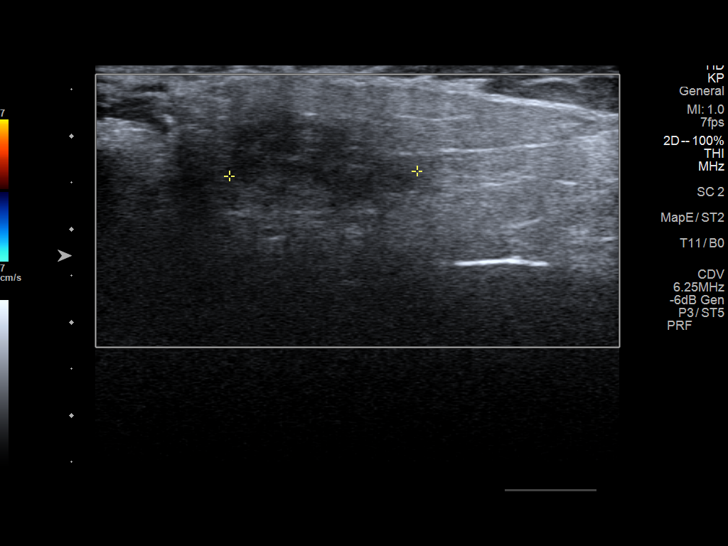
[im 11/31]
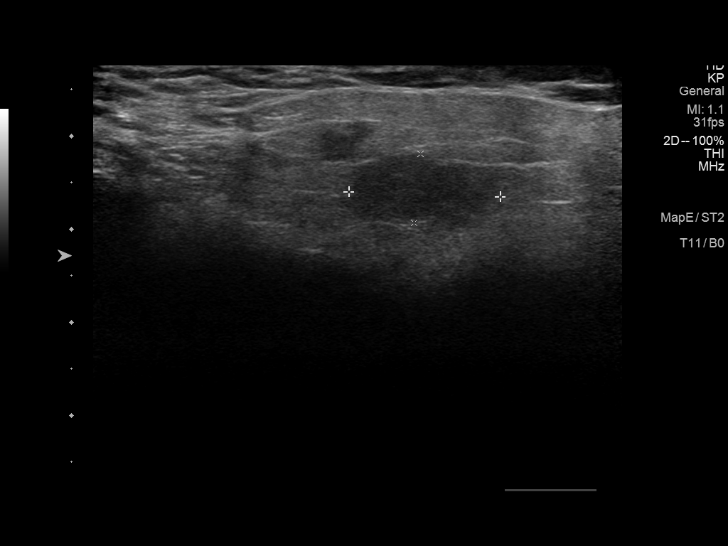
[im 12/31]
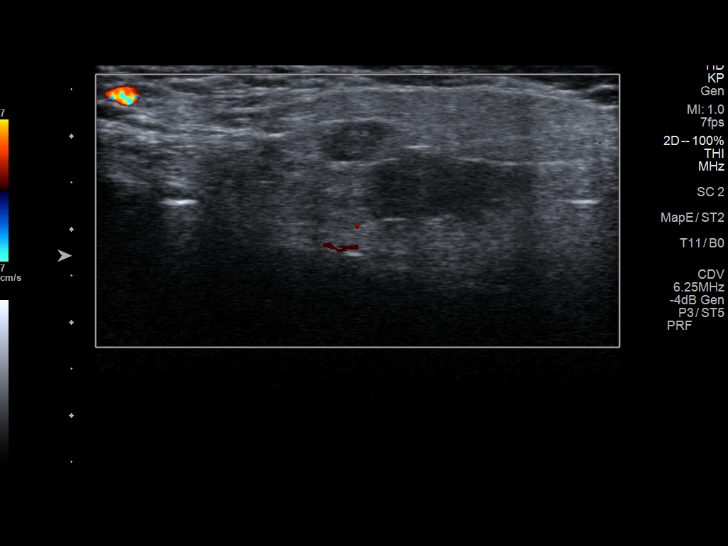
[im 14/31]
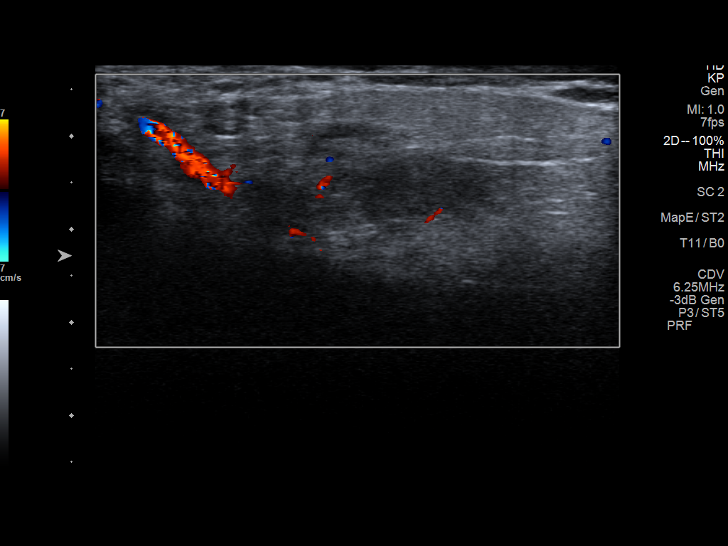
[im 17/31]
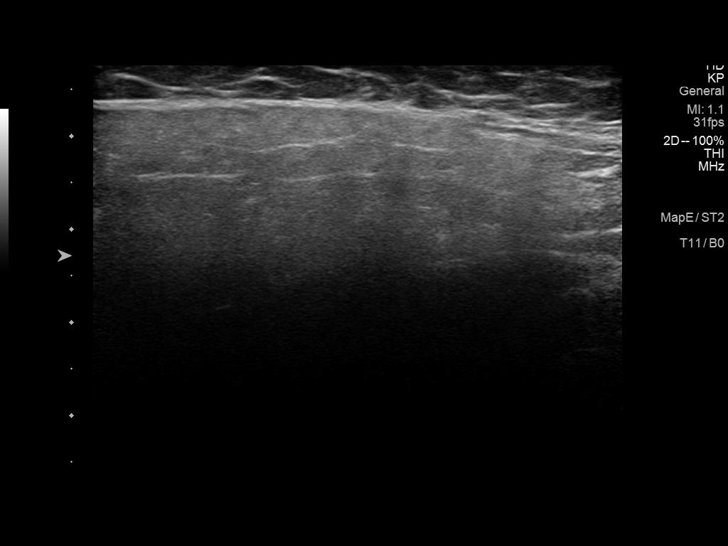
[im 19/31]
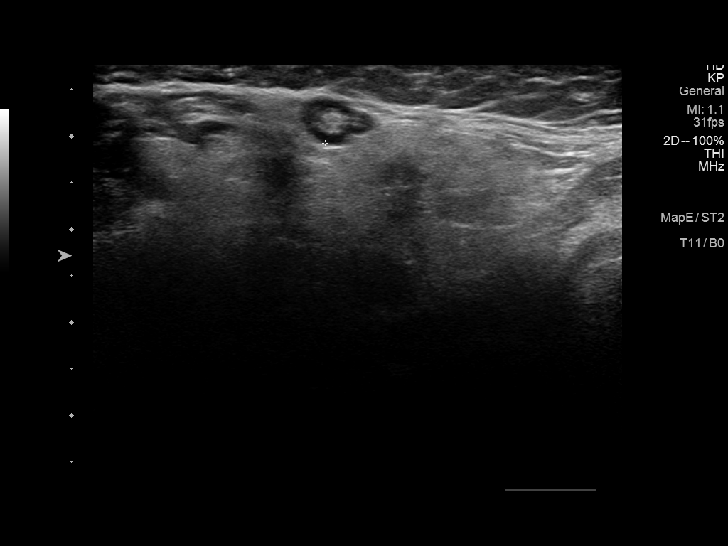
[im 21/31]
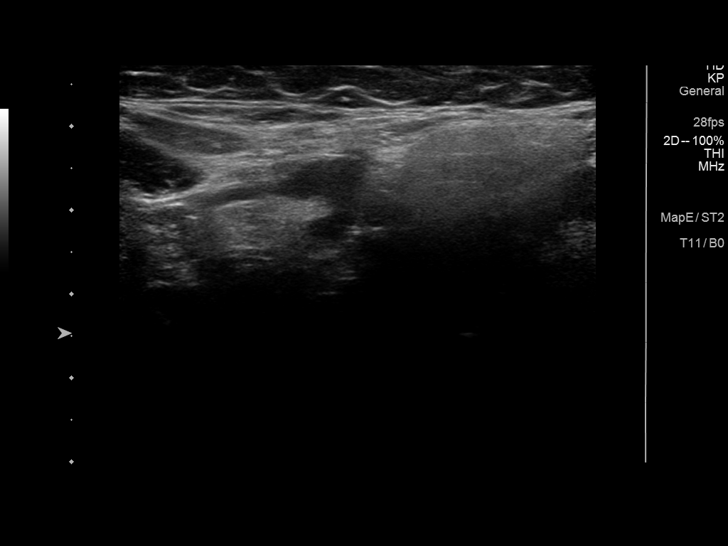
[im 23/31]
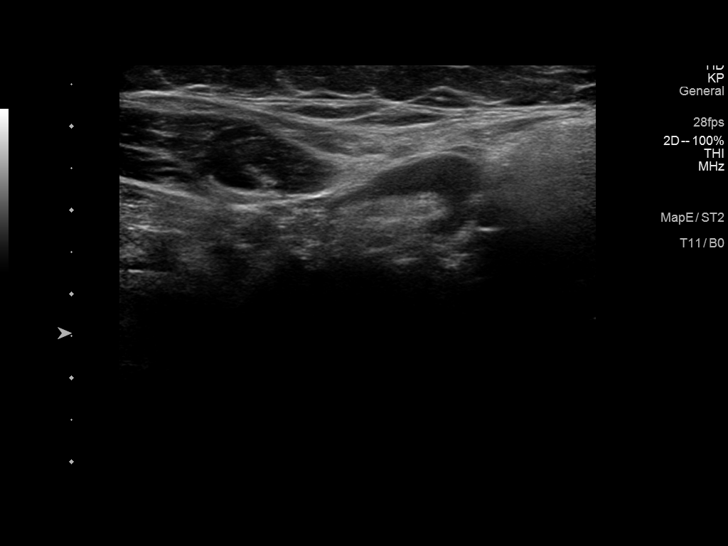
[im 26/31]
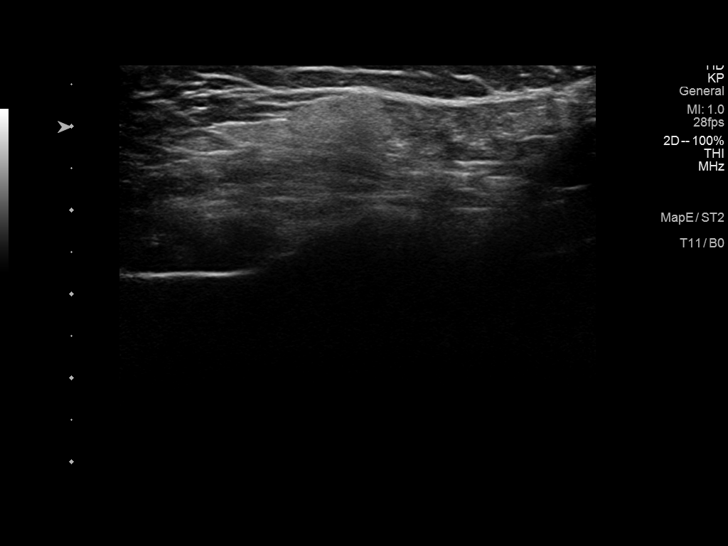
[im 28/31]
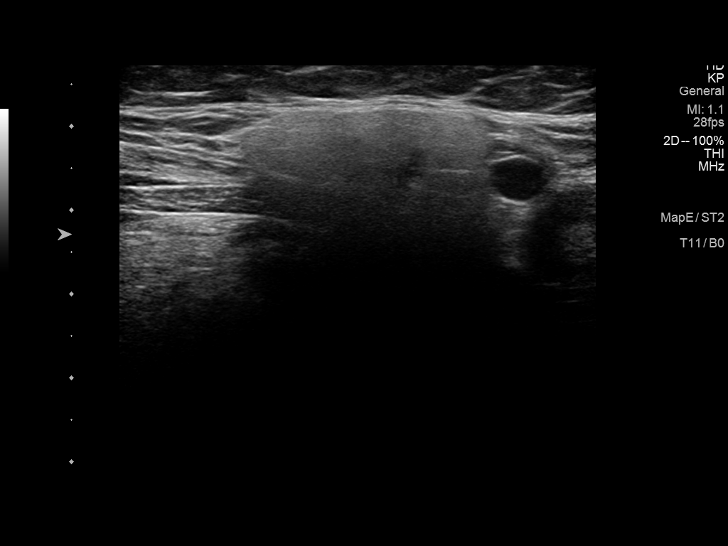
[im 31/31]
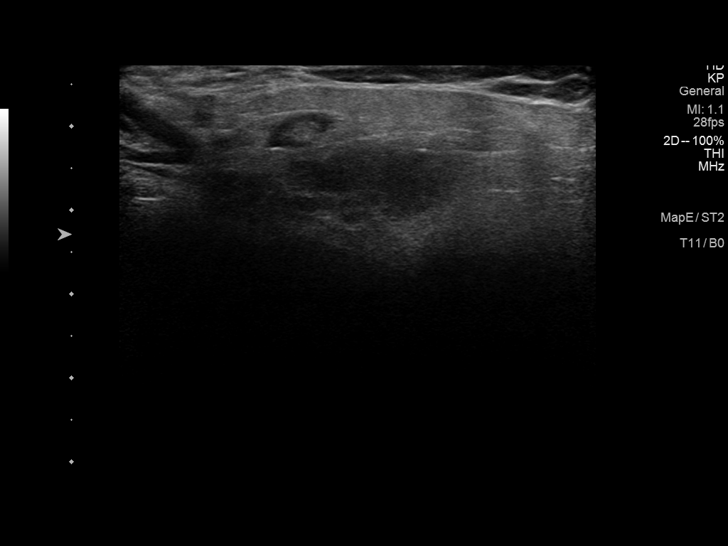

[14 of 25 positions shown; findings below may reference images not displayed]

FINDINGS: The right parotid gland appears diffusely larger than the left.
There is a hypoechoic mass in the right parotid gland measuring
2.0-2.3 cm in maximal dimension likely corresponding to the 2.4 cm
mass on prior CT. An adjacent normal appearing intraparotid lymph
node with prominent fatty hilum measures 4 mm in short axis.
Submandibular lymph nodes with fatty hila measure up to 10 mm in
short axis on the right and 8 mm on the left.
IMPRESSION: Persistent 2 cm right parotid mass without appreciable enlargement
from the [DATE] neck CT. This could reflect a pathologic lymph
node or primary neoplasm.

## 2019-07-27 ENCOUNTER — Other Ambulatory Visit: Payer: Managed Care, Other (non HMO)

## 2019-08-01 ENCOUNTER — Other Ambulatory Visit: Payer: Self-pay | Admitting: Otolaryngology

## 2019-08-01 ENCOUNTER — Other Ambulatory Visit (HOSPITAL_COMMUNITY): Payer: Self-pay | Admitting: Otolaryngology

## 2019-08-01 DIAGNOSIS — R600 Localized edema: Secondary | ICD-10-CM

## 2019-08-06 ENCOUNTER — Other Ambulatory Visit: Payer: Self-pay | Admitting: Physician Assistant

## 2019-08-07 ENCOUNTER — Ambulatory Visit (HOSPITAL_COMMUNITY)
Admission: RE | Admit: 2019-08-07 | Discharge: 2019-08-07 | Disposition: A | Discharge status: Home / Self Care | Payer: Managed Care, Other (non HMO) | Source: Ambulatory Visit | Attending: Otolaryngology | Admitting: Otolaryngology

## 2019-08-07 ENCOUNTER — Encounter (HOSPITAL_COMMUNITY): Payer: Self-pay

## 2019-08-07 ENCOUNTER — Other Ambulatory Visit: Payer: Self-pay

## 2019-08-07 DIAGNOSIS — Z882 Allergy status to sulfonamides status: Secondary | ICD-10-CM | POA: Insufficient documentation

## 2019-08-07 DIAGNOSIS — Z7984 Long term (current) use of oral hypoglycemic drugs: Secondary | ICD-10-CM | POA: Insufficient documentation

## 2019-08-07 DIAGNOSIS — Z79899 Other long term (current) drug therapy: Secondary | ICD-10-CM | POA: Insufficient documentation

## 2019-08-07 DIAGNOSIS — R600 Localized edema: Secondary | ICD-10-CM

## 2019-08-07 DIAGNOSIS — Z87891 Personal history of nicotine dependence: Secondary | ICD-10-CM | POA: Diagnosis not present

## 2019-08-07 DIAGNOSIS — Z880 Allergy status to penicillin: Secondary | ICD-10-CM | POA: Insufficient documentation

## 2019-08-07 DIAGNOSIS — K118 Other diseases of salivary glands: Secondary | ICD-10-CM | POA: Insufficient documentation

## 2019-08-07 DIAGNOSIS — E119 Type 2 diabetes mellitus without complications: Secondary | ICD-10-CM | POA: Insufficient documentation

## 2019-08-07 DIAGNOSIS — G4733 Obstructive sleep apnea (adult) (pediatric): Secondary | ICD-10-CM | POA: Insufficient documentation

## 2019-08-07 DIAGNOSIS — Z8249 Family history of ischemic heart disease and other diseases of the circulatory system: Secondary | ICD-10-CM | POA: Insufficient documentation

## 2019-08-07 LAB — CBC
HCT: 36.9 % (ref 36.0–46.0)
Hemoglobin: 11.7 g/dL — ABNORMAL LOW (ref 12.0–15.0)
MCH: 24.8 pg — ABNORMAL LOW (ref 26.0–34.0)
MCHC: 31.7 g/dL (ref 30.0–36.0)
MCV: 78.2 fL — ABNORMAL LOW (ref 80.0–100.0)
Platelets: 351 10*3/uL (ref 150–400)
RBC: 4.72 MIL/uL (ref 3.87–5.11)
RDW: 13.5 % (ref 11.5–15.5)
WBC: 10.7 10*3/uL — ABNORMAL HIGH (ref 4.0–10.5)
nRBC: 0 % (ref 0.0–0.2)

## 2019-08-07 LAB — PREGNANCY, URINE: Preg Test, Ur: NEGATIVE

## 2019-08-07 LAB — GLUCOSE, CAPILLARY: Glucose-Capillary: 119 mg/dL — ABNORMAL HIGH (ref 70–99)

## 2019-08-07 LAB — APTT: aPTT: 30 seconds (ref 24–36)

## 2019-08-07 LAB — PROTIME-INR
INR: 1 (ref 0.8–1.2)
Prothrombin Time: 13.5 seconds (ref 11.4–15.2)

## 2019-08-07 IMAGING — US US BIOPSY FNA W/ IMAGING
1 series · 9 of 9 positions shown · non-contrast
Comparison: none

CLINICAL DATA: Right parotid swelling. 2 cm right parotid mass
noted on ultrasound.

[Series 1: us biopsy fna w/ imaging · 9 acquisitions, 9 frames shown]
[im 1/9]
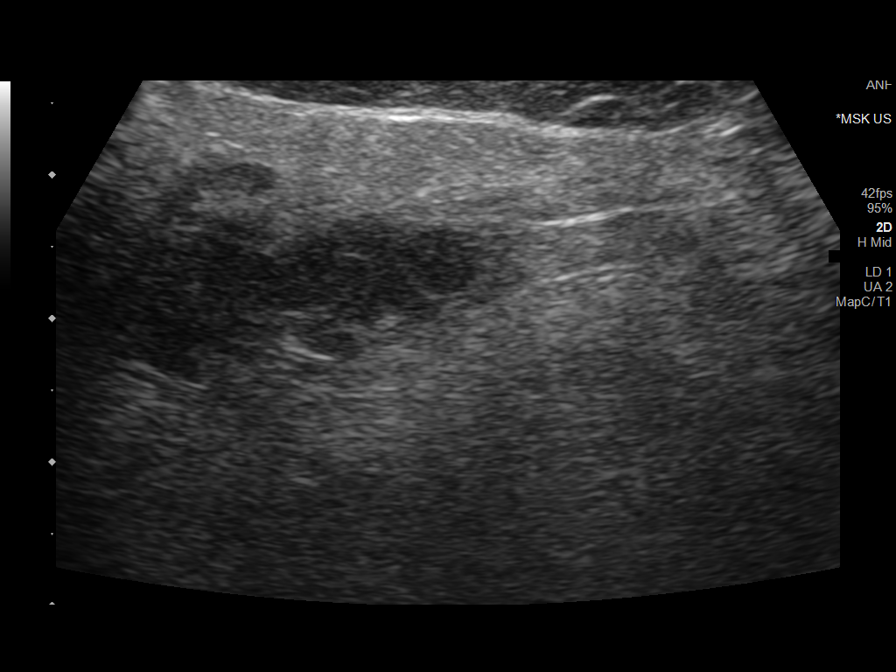
[im 2/9]
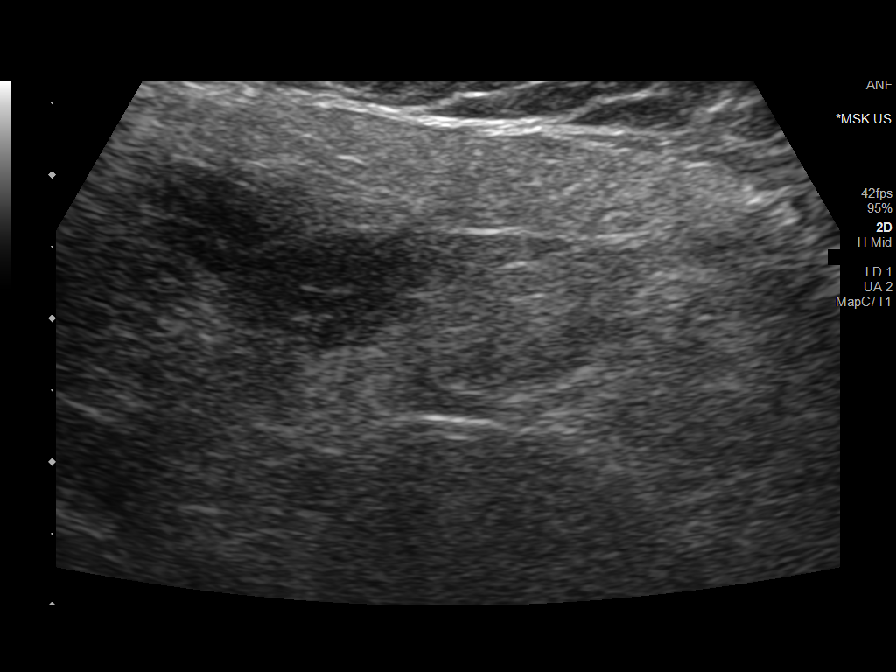
[im 3/9]
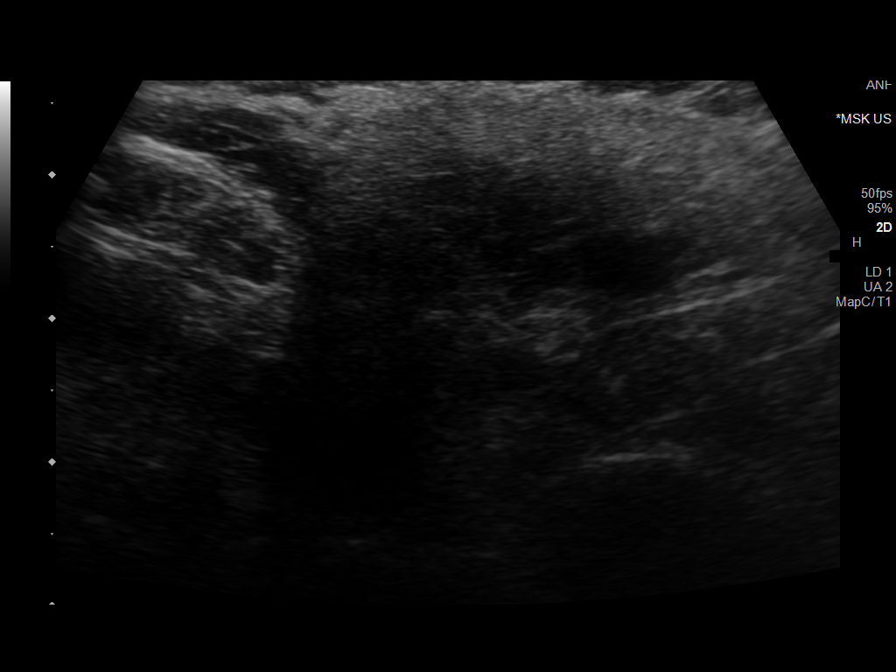
[im 4/9]
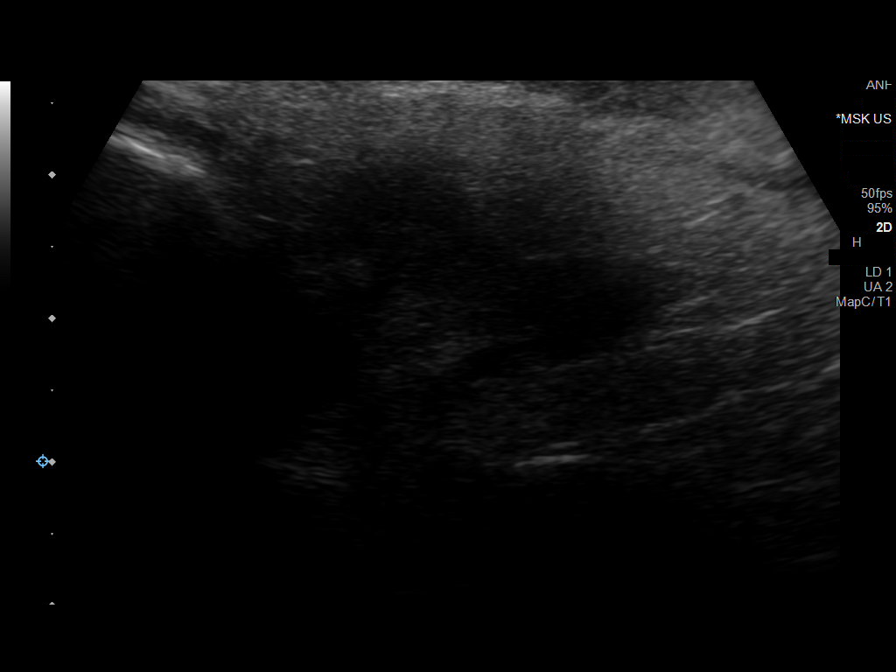
[im 5/9]
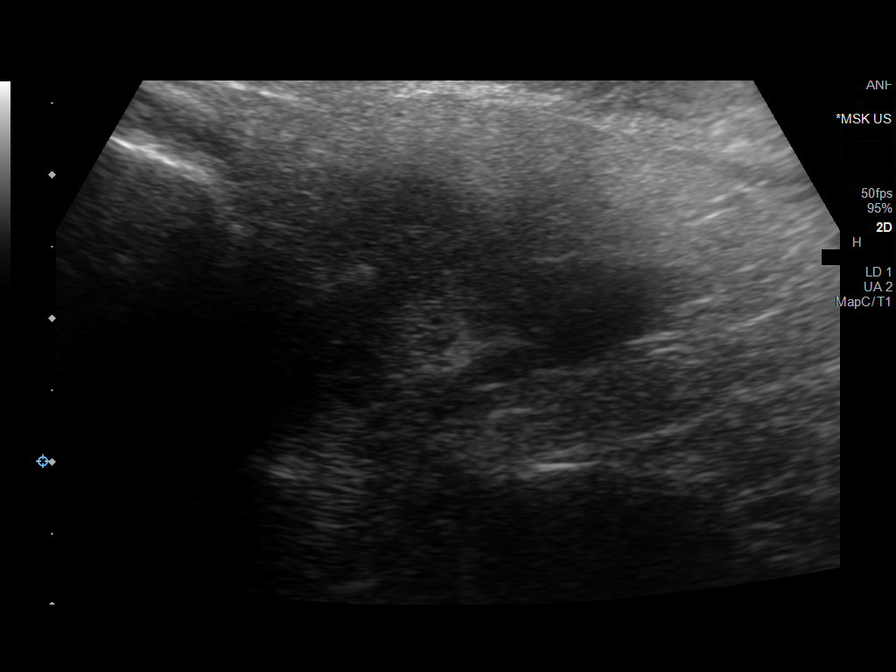
[im 6/9]
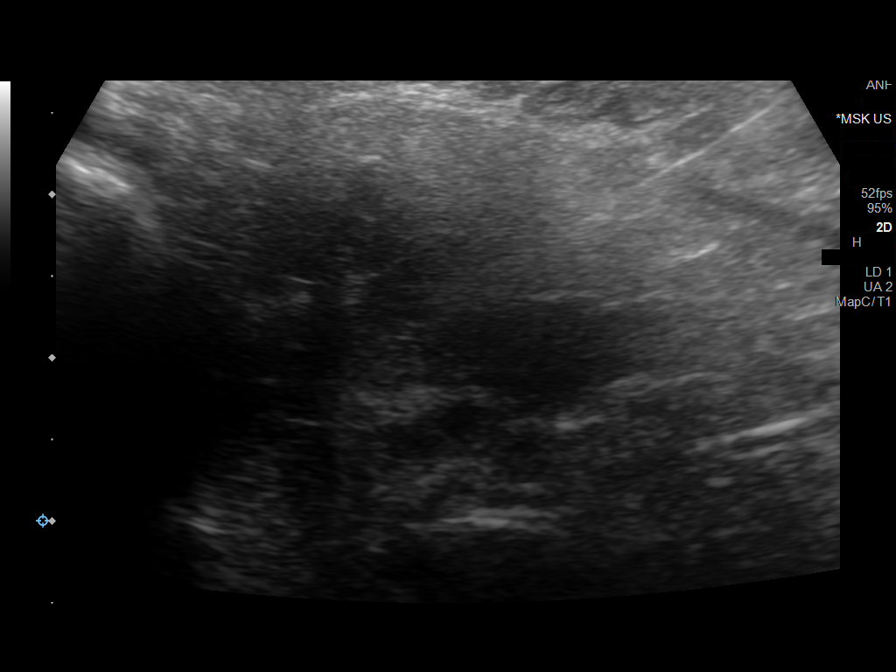
[im 7/9]
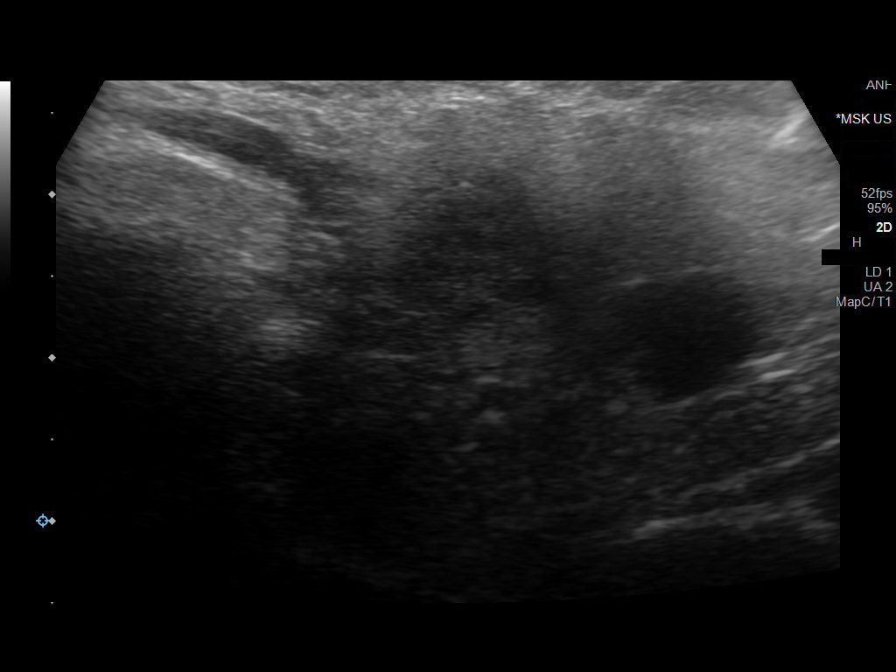
[im 8/9]
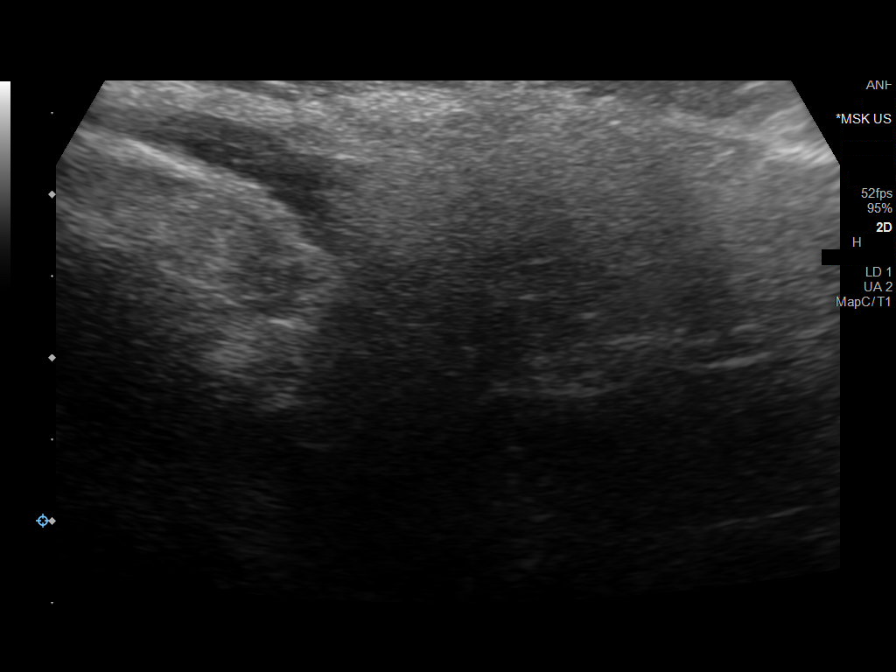
[im 9/9]
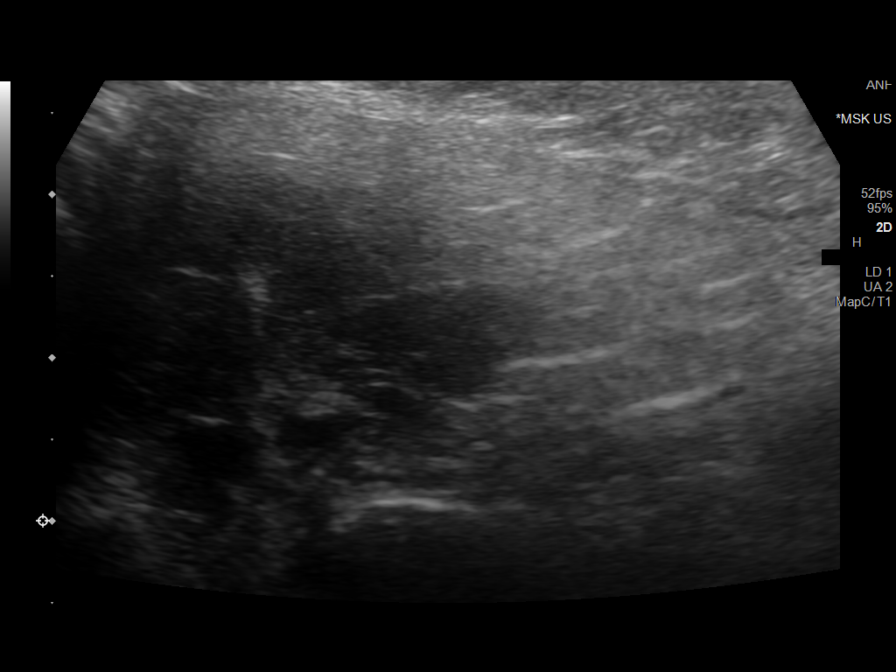

[9 of 9 positions shown; findings below may reference images not displayed]

EXAM:
ULTRASOUND GUIDED FNA BIOPSY OF RIGHT PAROTID LESION

MEDICATIONS:
Lidocaine 1% subcutaneous

PROCEDURE:
The procedure, risks, benefits, and alternatives were explained to
the patient. Questions regarding the procedure were encouraged and
answered. The patient understands and consents to the procedure.

Survey ultrasound of the right parotid performed. The hypoechoic
lesion was localized and an appropriate skin entry site was
determined and marked.

The operative field was prepped with chlorhexidine in a sterile
fashion, and a sterile drape was applied covering the operative
field. A sterile gown and sterile gloves were used for the
procedure. Local anesthesia was provided with 1% Lidocaine.

Under real-time ultrasound guidance, four #25 gauge FNA biopsy
samples were obtained, submitted to cytopathology. Postprocedure
scans show no hemorrhage or other apparent complication.

The patient tolerated the procedure well.

COMPLICATIONS:
None.
FINDINGS: Hypoechoic right parotid lesion localized. FNA biopsy samples
obtained as above.
IMPRESSION: 1. Technically successful FNA biopsy, right parotid lesion.

## 2019-08-07 MED ORDER — SODIUM CHLORIDE 0.9 % IV SOLN: INTRAVENOUS | Status: DC

## 2019-08-07 MED ORDER — HYDROCODONE-ACETAMINOPHEN 5-325 MG PO TABS: 1.0000 | ORAL_TABLET | ORAL | Status: DC | PRN

## 2019-08-07 MED ORDER — MIDAZOLAM HCL 2 MG/2ML IJ SOLN
INTRAMUSCULAR | Status: AC
  Filled 2019-08-07: qty 2

## 2019-08-07 MED ORDER — LIDOCAINE HCL (PF) 1 % IJ SOLN
INTRAMUSCULAR | Status: AC
  Filled 2019-08-07: qty 30

## 2019-08-07 MED ORDER — MIDAZOLAM HCL 2 MG/2ML IJ SOLN
INTRAMUSCULAR | Status: AC | PRN
  Administered 2019-08-07: 0.5 mg via INTRAVENOUS
  Administered 2019-08-07: 1 mg via INTRAVENOUS

## 2019-08-07 MED ORDER — FENTANYL CITRATE (PF) 100 MCG/2ML IJ SOLN
INTRAMUSCULAR | Status: AC
  Filled 2019-08-07: qty 2

## 2019-08-07 NOTE — H&P (Signed)
Chief Complaint: Patient was seen in consultation today for right parotid gland at the request of [facility]  Referring Physician(s): Wolicki,Karol  Supervising Physician: Arne Cleveland  Patient Status: [facility] - Out-pt  History of Present Illness: Denise Barnett is a 48 y.o. female   Pt has had swollen parotid gland now 2 months Was treated with antibiotics initially and relieved symptoms within 1 week or 2-- swelling returned and now even larger and painful. Non smoker (quit 10 yrs ago)  [place] 07/23/19: FINDINGS: The right parotid gland appears diffusely larger than the left. There is a hypoechoic mass in the right parotid gland measuring 2.0-2.3 cm in maximal dimension likely corresponding to the 2.4 cm mass on prior CT. An adjacent normal appearing intraparotid lymph node with prominent fatty hilum measures 4 mm in short axis. Submandibular lymph nodes with fatty hila measure up to 10 mm in short axis on the right and 8 mm on the left. IMPRESSION: Persistent 2 cm right parotid mass without appreciable enlargement from the 06/11/2019 neck CT. This could reflect a pathologic lymph node or primary neoplasm.  Now scheduled for biopsy of R parotid gland  Past Medical History:  Diagnosis Date   Bronchitis    Diabetes mellitus without complication ([place])    Type II   Heel spur, left    Incisional hernia    Lichen planus    OSA (obstructive sleep apnea) 12/25/2013   cpap- does not know settings    Pneumonia    hx of     Past Surgical History:  Procedure Laterality Date   BILATERAL SALPINGECTOMY Left 09/15/2017   Procedure: LEFT SALPINGECTOMY;  Surgeon: Everitt Amber, MD;  Location: [facility];  Service: Gynecology;  Laterality: Left;   FOREIGN BODY REMOVAL ABDOMINAL N/A 11/07/2018   Procedure: REMOVAL FOREIGN BODY ABDOMINAL;  Surgeon: Ralene Ok, MD;  Location: [place];  Service: General;  Laterality: N/A;   [place] N/A 05/29/2018   Procedure: LAPAROSCOPIC INCISIONAL HERNIA;  Surgeon: Ralene Ok, MD;  Location: [place];  Service: General;  Laterality: N/A;   INSERTION OF MESH N/A 05/29/2018   Procedure: INSERTION OF MESH;  Surgeon: Ralene Ok, MD;  Location: [place];  Service: General;  Laterality: N/A;   LAPAROSCOPY N/A 11/07/2018   Procedure: LAPAROSCOPY DIAGNOSTIC;  Surgeon: Ralene Ok, MD;  Location: [place];  Service: General;  Laterality: N/A;   LAPAROTOMY Bilateral 09/15/2017   Procedure: EXPLORATORY LAPAROTOMY;  Surgeon: Everitt Amber, MD;  Location: [facility];  Service: Gynecology;  Laterality: Bilateral;   none     OOPHORECTOMY Left 09/15/2017   Procedure: LEFT OOPHORECTOMY;  Surgeon: Everitt Amber, MD;  Location: [facility];  Service: Gynecology;  Laterality: Left;   VIDEO BRONCHOSCOPY Bilateral 04/18/2017   Procedure: VIDEO BRONCHOSCOPY WITHOUT FLUORO;  Surgeon: Rush Farmer, MD;  Location: [facility];  Service: Endoscopy;  Laterality: Bilateral;    Allergies: Bactrim [sulfamethoxazole-trimethoprim] and Penicillins  Medications: Prior to Admission medications   Medication Sig Start Date End Date Taking? Authorizing Provider  Apremilast 30 MG TABS Take 30 mg by mouth 2 (two) times daily.    Yes [provider]  celecoxib (CELEBREX) 200 MG capsule Take 200 mg by mouth daily.  04/05/18  Yes [provider]  clobetasol cream (TEMOVATE) [id number] % Apply 1 application topically daily as needed (psoriasis).    Yes [provider]  escitalopram (LEXAPRO) 20 MG tablet Take 20 mg by mouth daily.   Yes [provider]  metFORMIN (GLUCOPHAGE) 500  MG tablet Take 1,000 mg by mouth 2 (two) times daily with a meal.  10/18/15  Yes [provider]  Norgestimate-Ethinyl Estradiol Triphasic 0.18/0.215/0.25 MG-35 MCG tablet Take 1 tablet by mouth at bedtime.  09/23/13  Yes [provider]  valACYclovir (VALTREX) 500 MG tablet Take 500 mg by mouth 2 (two) times daily as  needed (cold sores).  04/19/18  Yes [provider]  traMADol (ULTRAM) 50 MG tablet Take 1 tablet (50 mg total) by mouth every 6 (six) hours as needed. Patient not taking: Reported on 08/03/2019 11/07/18 11/07/19  Ralene Ok, MD     Family History  Problem Relation Age of Onset   Sleep apnea Mother    Heart attack Brother     Social History   Socioeconomic History   Marital status: Single    Spouse name: Not on file   Number of children: 1   Years of education: 57   Highest education level: Not on file  Occupational History   Occupation: [employer]    Employer: [employer]  Social Designer, fashion/clothing strain: Not on file   Food insecurity    Worry: Not on file    Inability: Not on file   Transportation needs    Medical: Not on file    Non-medical: Not on file  Tobacco Use   Smoking status: Former Smoker    Packs/day: 0.75    Years: 20.00    Pack years: 15.00    Quit date: 2010    Years since quitting: 10.6   Smokeless tobacco: Never Used  Substance and Sexual Activity   Alcohol use: No   Drug use: No   Sexual activity: Yes  Lifestyle   Physical activity    Days per week: Not on file    Minutes per session: Not on file   Stress: Not on file  Relationships   Social connections    Talks on phone: Not on file    Gets together: Not on file    Attends religious service: Not on file    Active member of club or organization: Not on file    Attends meetings of clubs or organizations: Not on file    Relationship status: Not on file  Other Topics Concern   Not on file  Social History Narrative   Not on file     Review of Systems: A 12 point ROS discussed and pertinent positives are indicated in the HPI above.  All other systems are negative.  Review of Systems  Constitutional: Negative for activity change, fatigue and fever.  HENT: Negative for tinnitus and trouble swallowing.   Respiratory: Negative for cough,  choking and shortness of breath.   Cardiovascular: Negative for chest pain.  Gastrointestinal: Negative for abdominal pain.  Psychiatric/Behavioral: Negative for behavioral problems and confusion.    Vital Signs: BP (!) 152/89    Pulse 80    Temp 98.8 F (37.1 C) (Oral)    Resp 18    Ht 5\' 2"  (1.575 m)    Wt 208 lb (94.3 kg)    SpO2 98%    BMI 38.04 kg/m   Physical Exam Vitals signs reviewed.  Neck:     Musculoskeletal: Normal range of motion. Muscular tenderness present.     Comments: Rt swollen parotid Cardiovascular:     Rate and Rhythm: Normal rate and regular rhythm.     Heart sounds: Normal heart sounds.  Pulmonary:     Effort: Pulmonary  effort is normal.     Breath sounds: Normal breath sounds.  Abdominal:     Palpations: Abdomen is soft.  Musculoskeletal: Normal range of motion.  Skin:    General: Skin is warm and dry.  Neurological:     Mental Status: She is alert and oriented to person, place, and time.  Psychiatric:        Mood and Affect: Mood normal.        Behavior: Behavior normal.        Thought Content: Thought content normal.        Judgment: Judgment normal.     Imaging: US Soft Tissue Head & Neck (non-thyroid)  Result Date: 07/23/2019 CLINICAL DATA:  Swelling of right parotid gland.  Nondiagnostic FNA. EXAM: ULTRASOUND OF HEAD/NECK SOFT TISSUES TECHNIQUE: Ultrasound examination of the head and neck soft tissues was performed in the area of clinical concern. COMPARISON:  Neck CT 06/11/2019 FINDINGS: The right parotid gland appears diffusely larger than the left. There is a hypoechoic mass in the right parotid gland measuring 2.0-2.3 cm in maximal dimension likely corresponding to the 2.4 cm mass on prior CT. An adjacent normal appearing intraparotid lymph node with prominent fatty hilum measures 4 mm in short axis. Submandibular lymph nodes with fatty hila measure up to 10 mm in short axis on the right and 8 mm on the left. IMPRESSION: Persistent 2 cm right  parotid mass without appreciable enlargement from the 06/11/2019 neck CT. This could reflect a pathologic lymph node or primary neoplasm. Electronically Signed   By: Logan Bores M.D.   On: 07/23/2019 13:07    Labs:  CBC: Recent Labs    09/07/18 1138 09/20/18 2037 10/30/18 1021  WBC 12.0* 11.9* 10.5  HGB 12.0 11.6* 12.0  HCT 36.9 36.0 37.9  PLT 336 419* 355    COAGS: No results for input(s): INR, APTT in the last 8760 hours.  BMP: Recent Labs    09/07/18 1138 09/20/18 2037 10/30/18 1021  NA 139 141 135  K 4.5 4.1 4.1  CL 105 104 105  CO2 25 26 22   GLUCOSE 96 104* 136*  BUN 11 11 11   CALCIUM 9.9 9.8 8.9  CREATININE 0.66 0.65 0.57  GFRNONAA >60 >60 >60  GFRAA >60 >60 >60    LIVER FUNCTION TESTS: Recent Labs    09/07/18 1138 09/20/18 2037  BILITOT 0.5 0.5  AST 16 18  ALT 20 14  ALKPHOS 101 80  PROT 7.9 8.2*  ALBUMIN 3.8 4.0    TUMOR MARKERS: No results for input(s): AFPTM, CEA, CA199, CHROMGRNA in the last 8760 hours.  Assessment and Plan:  Right swollen and painful parotid gland Responded to antibiotic-- but recurred and painful Scheduled now for biopsy of same Risks and benefits of right parotid gland biopsy was discussed with the patient and/or patient's family including, but not limited to bleeding, infection, damage to adjacent structures or low yield requiring additional tests.  All of the questions were answered and there is agreement to proceed. Consent signed and in chart.   Thank you for this interesting consult.  I greatly enjoyed meeting Paolina MINI HORSTMEYER and look forward to participating in their care.  A copy of this report was sent to the requesting provider on this date.  Electronically Signed: Lavonia Drafts, PA-C 08/07/2019, 11:32 AM   I spent a total of  30 Minutes   in face to face in clinical consultation, greater than 50% of which was counseling/coordinating care for  right parotid gland bx

## 2019-08-07 NOTE — Discharge Instructions (Signed)
Needle Biopsy, Care After °This sheet gives you information about how to care for yourself after your procedure. Your health care provider may also give you more specific instructions. If you have problems or questions, contact your health care provider. °What can I expect after the procedure? °After the procedure, it is common to have soreness, bruising, or mild pain at the puncture site. This should go away in a few days. °Follow these instructions at home: °Needle insertion site care ° °· Wash your hands with soap and water before you change your bandage (dressing). If you cannot use soap and water, use hand sanitizer. °· Follow instructions from your health care provider about how to take care of your puncture site. This includes: °? When and how to change your dressing. °? When to remove your dressing. °· Check your puncture site every day for signs of infection. Check for: °? Redness, swelling, or pain. °? Fluid or blood. °? Pus or a bad smell. °? Warmth. °General instructions °· Return to your normal activities as told by your health care provider. Ask your health care provider what activities are safe for you. °· Do not take baths, swim, or use a hot tub until your health care provider approves. Ask your health care provider if you may take showers. You may only be allowed to take sponge baths. °· Take over-the-counter and prescription medicines only as told by your health care provider. °· Keep all follow-up visits as told by your health care provider. This is important. °Contact a health care provider if: °· You have a fever. °· You have redness, swelling, or pain at the puncture site that lasts longer than a few days. °· You have fluid, blood, or pus coming from your puncture site. °· Your puncture site feels warm to the touch. °Get help right away if: °· You have severe bleeding from the puncture site. °Summary °· After the procedure, it is common to have soreness, bruising, or mild pain at the puncture  site. This should go away in a few days. °· Check your puncture site every day for signs of infection, such as redness, swelling, or pain. °· Get help right away if you have severe bleeding from your puncture site. °This information is not intended to replace advice given to you by your health care provider. Make sure you discuss any questions you have with your health care provider. °Document Released: 04/15/2015 Document Revised: 02/10/2018 Document Reviewed: 12/12/2017 °Elsevier Patient Education © 2020 [employer] ° °

## 2019-08-07 NOTE — Procedures (Signed)
  Procedure: [place] FNA R parotid mass 25g x4 EBL:   minimal Complications:  none immediate  See full dictation in [facility].  Dillard Cannon MD Main # [phone] Pager  [phone]

## 2019-08-29 ENCOUNTER — Telehealth: Payer: Self-pay

## 2019-08-29 NOTE — Telephone Encounter (Signed)
I called pt and reminded her to bring her cpap to to her appt tomorrow. Pt verbalized understanding.

## 2019-08-30 ENCOUNTER — Other Ambulatory Visit: Payer: Self-pay

## 2019-08-30 ENCOUNTER — Encounter: Payer: Self-pay | Admitting: Neurology

## 2019-08-30 ENCOUNTER — Ambulatory Visit: Payer: Managed Care, Other (non HMO) | Admitting: Neurology

## 2019-08-30 VITALS — BP 141/87 | HR 82 | Ht 63.0 in | Wt 209.0 lb

## 2019-08-30 DIAGNOSIS — G4733 Obstructive sleep apnea (adult) (pediatric): Secondary | ICD-10-CM

## 2019-08-30 DIAGNOSIS — Z9989 Dependence on other enabling machines and devices: Secondary | ICD-10-CM

## 2019-08-30 NOTE — Progress Notes (Signed)
Subjective:    Patient ID: Denise Barnett is a 48 y.o. female.  HPI     Interim history:   Denise Barnett is a 48 year old right-handed woman with any underlying medical history of seasonal allergies, vertigo, recurrent sinusitis, and morbid obesity, who presents for followup consultation of her obstructive sleep apnea, on CPAP therapy. The patient is unaccompanied today and presents after over 2  years. I last saw her 02/09/2017, at which time she was compliant with her CPAP.  She felt stable.   Today, 08/30/2019: I reviewed her CPAP compliance data from 07/31/2019 through 08/29/2019 which is a total of 30 days, during which time she used her machine every night with percent use days greater than 4 hours at 100%, indicating superb compliance with an average usage of 8 hours and 4 minutes, residual AHI at goal at 0.7/h, leak on the low side with a 95th percentile at 7.4 L/min on a pressure of 8 cm with EPR of 3. She reports doing well with her CPAP, she does need new supplies.  She uses a nasal mask, sometimes she notices air leaking from her mouth but otherwise it is going well.  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  She started Lexapro.  She has also noticed some 3 to 4 months ago a swelling in the right parotid gland area and neck area.  She had an ultrasound, she had a CT, she had consultation with ENT and also a fine-needle aspiration, no evidence of a cancerous lesion but no full resolution as to the swelling either.  She is discussing next steps with her primary care physician.  She has had left foot pain and bone spur issues, had an injection with her podiatrist into her left heel and is wearing a boot.  She has started meloxicam 7.5 mg daily about a week ago.    The patient's allergies, current medications, family history, past medical history, past social history, past surgical  history and problem list were reviewed and updated as appropriate.    Previously (copied from previous notes for reference):    I saw her on 12/25/2015, at which time she reported doing well, she had lost some weight. She was compliant with CPAP therapy. She was still benefiting from treatment. She was supposed to start a new medication through dermatology for her lichen planus. She had a flareup in her eczema. She was no longer on methotrexate. She started seeing a new PCP.   I reviewed her CPAP compliance data from 01/10/2017 through 02/08/2017, which is a total of 30 days, during which time she used her CPAP every night with percent used days greater than 4 hours at 97%, indicating excellent compliance with an average usage of 8 hours and 23 minutes. Residual AHI low at 0.3 per hour, leak low with the 95th percentile at 4.9 L/m on pressure of 8 cm with EPR of 3.    Of note, she canceled an appointment for 10/17/2014. I saw her on 04/16/2014, at which time she reported doing well with CPAP. She felt better rested and no longer snored. She was fully compliant with treatment.    I reviewed her CPAP compliance data from 11/25/2015 through 12/24/2015, which is a total of 30 days during which time she used her machine every night with percent used days greater than 4 hours of 97%, indicating excellent compliance  with an average usage of 7 hours and 52 minutes, residual AHI low at 0.4 per hour, leak low with the 95th percentile at 4.1 L/m on a pressure of 8 cm with EPR of 3.   She canceled an appointment for 04/11/14. I first met her on 12/25/2013 at the request of her primary care provider, at which time the patient reported loud snoring, a family history of sleep apnea and complains of excessive daytime somnolence. I suggested she return for a sleep study. She had baseline sleep study on 01/04/2014, followed by a CPAP titration study on 02/05/2014. I went over all her test results in detail today. Her  baseline sleep study from 01/04/2014 showed a sleep efficiency of 81.6% with a prolonged sleep latency of 48 minutes and wake after sleep onset of 36 minutes with moderate sleep fragmentation noted. She had an elevated arousal index of 39.4 per hour. She had an elevation in light stage sleep, mildly decreased percentage of slow-wave sleep, and a mildly decreased percentage of REM sleep with a prolonged REM latency of 169 minutes. She had no significant periodic leg movements of sleep and no significant EKG changes. She had mild to moderate and sometimes loud snoring. She had a total AHI of 5.5 per hour, rising to 11.5 per hour in the supine position. Baseline oxygen saturation was 94% with a nadir of 88% in REM sleep. Based on her sleep related complaints I felt that she would be a candidate for treatment for sleep apnea and I felt the main 2 options would be CPAP versus an oral appliance. She elected to come back for a CPAP titration study which she had on 02/05/2014: Sleep efficiency was 74.8%. She had a prolonged sleep latency of 41 minutes and a wake after sleep onset of 65 minutes with moderate sleep fragmentation noted. She had an increased percentage of stage I sleep, a normal percentage of stage II sleep, a normal percentage of slow-wave sleep, and a reduced percentage of REM sleep with a prolonged REM latency. She had no significant periodic leg movements of sleep and no significant EKG changes. She was titrated on CPAP from 5-9 cm with elimination of her sleep disordered breathing with a pressure of 8 cm. She did indicate that she felt she slept better than usual. Based on the test results I placed her on CPAP therapy at home. I reviewed her compliance data from 02/23/2014 to 03/24/2014, which is a total of 30 days, during which time the patient used CPAP every day. The average usage for all days was 7 hours and 59 minutes. The percent used days greater than 4 hours was 100 %, indicating superb compliance.  The residual AHI was 0.9 per hour, indicating an appropriate treatment pressure of 8 cwp with EPR of 3. Air leak from the mask was low at 5.9 L per minute at the 95th percentile.   I reviewed her compliance data from 03/17/2014 through 04/15/2014 which is a total of 30 days during which time she uses CPAP every night, with an average usage of 8 hours and 51 minutes. Percent used days greater than 4 hours was 100%, indicating superb compliance with a residual AHI of 0.8 per hour and very low leak.  Her Past Medical History Is Significant For: Past Medical History:  Diagnosis Date  . Bronchitis   . Diabetes mellitus without complication ([place])    Type II  . Heel spur, left   . Incisional hernia   . Lichen planus   .  OSA (obstructive sleep apnea) 12/25/2013   cpap- does not know settings   . Pneumonia    hx of     Her Past Surgical History Is Significant For: Past Surgical History:  Procedure Laterality Date  . BILATERAL SALPINGECTOMY Left 09/15/2017   Procedure: LEFT SALPINGECTOMY;  Surgeon: Everitt Amber, MD;  Location: [facility];  Service: Gynecology;  Laterality: Left;  . FOREIGN BODY REMOVAL ABDOMINAL N/A 11/07/2018   Procedure: REMOVAL FOREIGN BODY ABDOMINAL;  Surgeon: Ralene Ok, MD;  Location: [place];  Service: General;  Laterality: N/A;  . INCISIONAL HERNIA REPAIR N/A 05/29/2018   Procedure: LAPAROSCOPIC INCISIONAL HERNIA;  Surgeon: Ralene Ok, MD;  Location: [place];  Service: General;  Laterality: N/A;  . INSERTION OF MESH N/A 05/29/2018   Procedure: INSERTION OF MESH;  Surgeon: Ralene Ok, MD;  Location: [place];  Service: General;  Laterality: N/A;  . LAPAROSCOPY N/A 11/07/2018   Procedure: LAPAROSCOPY DIAGNOSTIC;  Surgeon: Ralene Ok, MD;  Location: [place];  Service: General;  Laterality: N/A;  . LAPAROTOMY Bilateral 09/15/2017   Procedure: EXPLORATORY LAPAROTOMY;  Surgeon: Everitt Amber, MD;  Location: [facility];  Service: Gynecology;  Laterality: Bilateral;  . none     . OOPHORECTOMY Left 09/15/2017   Procedure: LEFT OOPHORECTOMY;  Surgeon: Everitt Amber, MD;  Location: [facility];  Service: Gynecology;  Laterality: Left;  [name] VIDEO BRONCHOSCOPY Bilateral 04/18/2017   Procedure: VIDEO BRONCHOSCOPY WITHOUT FLUORO;  Surgeon: Rush Farmer, MD;  Location: [facility];  Service: Endoscopy;  Laterality: Bilateral;    Her Family History Is Significant For: Family History  Problem Relation Age of Onset  . Sleep apnea Mother   . Heart attack Brother     Her Social History Is Significant For: Social History   Socioeconomic History  . Marital status: Single    Spouse name: Not on file  . Number of children: 1  . Years of education: 77  . Highest education level: Not on file  Occupational History  . Occupation: Scientific laboratory technician: [employer]  Social Needs  . Financial resource strain: Not on file  . Food insecurity    Worry: Not on file    Inability: Not on file  . Transportation needs    Medical: Not on file    Non-medical: Not on file  Tobacco Use  . Smoking status: Former Smoker    Packs/day: 0.75    Years: 20.00    Pack years: 15.00    Quit date: 2010    Years since quitting: 10.7  . Smokeless tobacco: Never Used  Substance and Sexual Activity  . Alcohol use: No  . Drug use: No  . Sexual activity: Yes  Lifestyle  . Physical activity    Days per week: Not on file    Minutes per session: Not on file  . Stress: Not on file  Relationships  . Social Herbalist on phone: Not on file    Gets together: Not on file    Attends religious service: Not on file    Active member of club or organization: Not on file    Attends meetings of clubs or organizations: Not on file    Relationship status: Not on file  Other Topics Concern  . Not on file  Social History Narrative  . Not on file    Her Allergies Are:  Allergies  Allergen Reactions  . Bactrim [Sulfamethoxazole-Trimethoprim] Rash  . Penicillins Rash    Has  patient had a PCN reaction causing immediate rash, facial/tongue/throat swelling, SOB or lightheadedness with hypotension: No Has patient had a PCN reaction causing severe rash involving mucus membranes or skin necrosis: Unknown Has patient had a PCN reaction that required hospitalization: No Has patient had a PCN reaction occurring within the last 10 years: No If all of the above answers are "NO", then may proceed with Cephalosporin use.   :   Her Current Medications Are:  Outpatient Encounter Medications as of 08/30/2019  Medication Sig  . Apremilast 30 MG TABS Take 30 mg by mouth 2 (two) times daily.   . clobetasol cream (TEMOVATE) 9.17 % Apply 1 application topically daily as needed (psoriasis).   [name] escitalopram (LEXAPRO) 20 MG tablet Take 20 mg by mouth daily.  . metFORMIN (GLUCOPHAGE) 500 MG tablet Take 1,000 mg by mouth 2 (two) times daily with a meal.   . Norgestimate-Ethinyl Estradiol Triphasic 0.18/0.215/0.25 MG-35 MCG tablet Take 1 tablet by mouth at bedtime.   . valACYclovir (VALTREX) 500 MG tablet Take 500 mg by mouth 2 (two) times daily as needed (cold sores).   . [DISCONTINUED] celecoxib (CELEBREX) 200 MG capsule Take 200 mg by mouth daily.   . [DISCONTINUED] traMADol (ULTRAM) 50 MG tablet Take 1 tablet (50 mg total) by mouth every 6 (six) hours as needed. (Patient not taking: Reported on 08/03/2019)   No facility-administered encounter medications on file as of 08/30/2019.   :  Review of Systems:  Out of a complete 14 point review of systems, all are reviewed and negative with the exception of these symptoms as listed below: Review of Systems  Neurological:       Pt presents today to discuss her cpap. Pt needs an updated cpap supply order.    Objective:  Neurological Exam  Physical Exam Physical Examination:   Vitals:   08/30/19 1137  BP: (!) 141/87  Pulse: 82    General Examination: The patient is a very pleasant 48 y.o. female in no acute distress. She appears  well-developed and well-nourished and well groomed.   HEENT: Normocephalic, atraumatic, pupils are equal, round and reactive to light. She is wearing corrective eyeglasses. Extraocular tracking is good without limitation to gaze excursion or nystagmus noted. Normal smooth pursuit is noted. Hearing is grossly intact. Face is symmetric with normal facial animation and normal facial sensation. Speech is clear with no dysarthria noted. There is no hypophonia. There is no lip, neck/head, jaw or voice tremor. Neck is supple with full range of passive and active motion. There are no carotid bruits on auscultation. Oropharynx exam reveals: mild mouth dryness, adequate dental hygiene and mild airway crowding. Mallampati is class II. Tongue protrudes centrally and palate elevates symmetrically. Tonsils are 1+.   Chest: Clear to auscultation without wheezing, rhonchi or crackles noted.  Heart: S1+S2+0, regular and normal without murmurs, rubs or gallops noted.   Abdomen: Soft, non-tender and non-distended with normal bowel sounds appreciated on auscultation.  Extremities: There is no pitting edema in the distal lower extremities bilaterally. Pedal pulses are intact.  Skin: Warm and dry with telltale changes to her hands and forearms from eczema. She is scratching.   Musculoskeletal: exam reveals Left foot and ankle brace.   Neurologically:  Mental status: The patient is awake, alert and oriented in all 4 spheres. Her immediate and remote memory, attention, language skills and fund of knowledge are appropriate. There is no evidence of aphasia, agnosia, apraxia or anomia. Speech is clear with normal prosody and  enunciation. Thought process is linear. Mood is normal and affect is normal.  Cranial nerves II - XII are as described above under HEENT exam.  Motor exam: Normal bulk, strength and tone is noted. There is no tremor. Fine motor skills and coordination: intact.  Cerebellar testing: No dysmetria  or intention tremor. There is no truncal or gait ataxia.  Sensory exam: intact to light touch in the upper and lower extremities.  Gait, station and balance: She stands easily. No veering to one side is noted. No leaning to one side is noted. Posture is age-appropriate and stance is narrow based. Gait shows normal stride length and normal pace.   Assessment and plan:  In summary, Denise Barnett is a very pleasant 48 year old female with an underlying medical history of allergies, eczema, vertigo, lichen planus, psoriasis, obesity, recurrent sinusitis, who presents for follow-up consultation of her OSA, well established on CPAP therapy at 8 cm of water pressure since 2015. She is compliant with treatment and we reviewed the most recent compliance data. She is commended for her treatment adherence. She needs supplies and I placed an order for this. She is encouraged to continue to try to lose weight and continue with her CPAP at the current setting. She has benefited from treatment.  Of note, she had sleep study testing in 2015. I suggested a one-year checkup routinely, she can see one of our nurse practitioners at the time. I answered all her questions today and she was in agreement.  I spent 20 minutes in total face-to-face time with the patient, more than 50% of which was spent in counseling and coordination of care, reviewing test results, reviewing medication and discussing or reviewing the diagnosis of OSA, its prognosis and treatment options. Pertinent laboratory and imaging test results that were available during this visit with the patient were reviewed by me and considered in my medical decision making (see chart for details).

## 2019-08-30 NOTE — Patient Instructions (Signed)

## 2019-10-04 ENCOUNTER — Other Ambulatory Visit: Payer: Self-pay

## 2019-10-04 ENCOUNTER — Ambulatory Visit (INDEPENDENT_AMBULATORY_CARE_PROVIDER_SITE_OTHER): Payer: Managed Care, Other (non HMO) | Admitting: Otolaryngology

## 2019-10-04 DIAGNOSIS — D37030 Neoplasm of uncertain behavior of the parotid salivary glands: Secondary | ICD-10-CM

## 2019-11-07 ENCOUNTER — Other Ambulatory Visit: Payer: Self-pay | Admitting: Otolaryngology

## 2019-12-28 NOTE — Progress Notes (Signed)
[id number] - [place], [place] - [address] AT [place] [address] Phone: [phone] Fax: [phone]      Your procedure is scheduled on Wednesday Jan 02, 2020.   Report to [facility] Main Entrance "A" at [place].M., and check in at the Admitting office.  Call this number if you have problems the morning of surgery:  [phone]   Call [phone] if you have any questions prior to your surgery date Monday-Friday 8am-4pm    Remember:  Do not eat or drink after midnight the night before your surgery    Take these medicines the morning of surgery with A SIP OF WATER:  Atorvastatin (Lipitor)   Cyclobenzaprine (Flexeril) - as needed   Escitalopram (Lexapro)  Valacyclovir (Valtrex) - as needed   Norgestimate-Ethinyl Estradiol Triphasic 0.18/0.215/0.25  WHAT DO I DO ABOUT MY DIABETES MEDICATION?   [name] Do not take oral diabetes medicines (pills) the morning of surgery.  . THE NIGHT BEFORE SURGERY, take Metformin as usual.    . **DO NOT take any oral diabetic medication MORNING OF SURGERY**    . The day of surgery, do not take other diabetes injectables, including Byetta (exenatide), Bydureon (exenatide ER), Victoza (liraglutide), or Trulicity (dulaglutide).  . If your CBG is greater than 220 mg/dL, you may take  of your sliding scale (correction) dose of insulin.   HOW TO MANAGE YOUR DIABETES BEFORE AND AFTER SURGERY  Why is it important to control my blood sugar before and after surgery? . Improving blood sugar levels before and after surgery helps healing and can limit problems. . A way of improving blood sugar control is eating a healthy diet by: o  Eating less sugar and carbohydrates o  Increasing activity/exercise o  Talking with your doctor about reaching your blood sugar goals . High blood sugars (greater than 180 mg/dL) can raise your risk of infections and slow your recovery, so you will need  to focus on controlling your diabetes during the weeks before surgery. . Make sure that the doctor who takes care of your diabetes knows about your planned surgery including the date and location.  How do I manage my blood sugar before surgery? . Check your blood sugar at least 4 times a day, starting 2 days before surgery, to make sure that the level is not too high or low. . Check your blood sugar the morning of your surgery when you wake up and every 2 hours until you get to the Short Stay unit. o If your blood sugar is less than 70 mg/dL, you will need to treat for low blood sugar: - Do not take insulin. - Treat a low blood sugar (less than 70 mg/dL) with  cup of clear juice (cranberry or apple), 4 glucose tablets, OR glucose gel. - Recheck blood sugar in 15 minutes after treatment (to make sure it is greater than 70 mg/dL). If your blood sugar is not greater than 70 mg/dL on recheck, call [phone] for further instructions. . Report your blood sugar to the short stay nurse when you get to Short Stay.  . If you are admitted to the hospital after surgery: o Your blood sugar will be checked by the staff and you will probably be given insulin after surgery (instead of oral diabetes medicines) to make sure you have good blood sugar levels. o The goal for blood sugar control after surgery is 80-180 mg/dL.   7 days  prior to surgery STOP taking any Aspirin (unless otherwise instructed by your surgeon), Aleve, Naproxen, Ibuprofen, Motrin, Advil, Goody's, BC's, all herbal medications, fish oil, and all vitamins.    The Morning of Surgery  Do not wear jewelry, make-up or nail polish.  Do not wear lotions, powders, perfumes, or deodorant  Do not shave 48 hours prior to surgery.   Do not bring valuables to the hospital.  [facility] is not responsible for any belongings or valuables.  If you are a smoker, DO NOT Smoke 24 hours prior to surgery  If you wear a CPAP at night please bring your  mask the morning of surgery   Remember that you must have someone to transport you home after your surgery, and remain with you for 24 hours if you are discharged the same day.   Please bring cases for contacts, glasses, hearing aids, dentures or bridgework because it cannot be worn into surgery.    Leave your suitcase in the car.  After surgery it may be brought to your room.  For patients admitted to the hospital, discharge time will be determined by your treatment team.  Patients discharged the day of surgery will not be allowed to drive home.    Special instructions:   [facility]- Preparing For Surgery  Before surgery, you can play an important role. Because skin is not sterile, your skin needs to be as free of germs as possible. You can reduce the number of germs on your skin by washing with CHG (chlorahexidine gluconate) Soap before surgery.  CHG is an antiseptic cleaner which kills germs and bonds with the skin to continue killing germs even after washing.    Oral Hygiene is also important to reduce your risk of infection.  Remember - BRUSH YOUR TEETH THE MORNING OF SURGERY WITH YOUR REGULAR TOOTHPASTE  Please do not use if you have an allergy to CHG or antibacterial soaps. If your skin becomes reddened/irritated stop using the CHG.  Do not shave (including legs and underarms) for at least 48 hours prior to first CHG shower. It is OK to shave your face.  Please follow these instructions carefully.   1. Shower the NIGHT BEFORE SURGERY and the MORNING OF SURGERY with CHG Soap.   2. If you chose to wash your hair, wash your hair first as usual with your normal shampoo.  3. After you shampoo, rinse your hair and body thoroughly to remove the shampoo.  4. Use CHG as you would any other liquid soap. You can apply CHG directly to the skin and wash gently with a scrungie or a clean washcloth.   5. Apply the CHG Soap to your body ONLY FROM THE NECK DOWN.  Do not use on open wounds  or open sores. Avoid contact with your eyes, ears, mouth and genitals (private parts). Wash Face and genitals (private parts)  with your normal soap.   6. Wash thoroughly, paying special attention to the area where your surgery will be performed.  7. Thoroughly rinse your body with warm water from the neck down.  8. DO NOT shower/wash with your normal soap after using and rinsing off the CHG Soap.  9. Pat yourself dry with a CLEAN TOWEL.  10. Wear CLEAN PAJAMAS to bed the night before surgery, wear comfortable clothes the morning of surgery  11. Place CLEAN SHEETS on your bed the night of your first shower and DO NOT SLEEP WITH PETS.    Day of Surgery:  Please shower the morning of surgery with the CHG soap Do not apply any deodorants/lotions. Please wear clean clothes to the hospital/surgery center.   Remember to brush your teeth WITH YOUR REGULAR TOOTHPASTE.   Please read over the following fact sheets that you were given.

## 2019-12-31 ENCOUNTER — Other Ambulatory Visit (HOSPITAL_COMMUNITY)
Admission: RE | Admit: 2019-12-31 | Discharge: 2019-12-31 | Disposition: A | Discharge status: Home / Self Care | Payer: Managed Care, Other (non HMO) | Source: Ambulatory Visit | Attending: Otolaryngology | Admitting: Otolaryngology

## 2019-12-31 ENCOUNTER — Encounter (HOSPITAL_COMMUNITY)
Admission: RE | Admit: 2019-12-31 | Discharge: 2019-12-31 | Disposition: A | Discharge status: Home / Self Care | Payer: Managed Care, Other (non HMO) | Source: Ambulatory Visit | Attending: Otolaryngology | Admitting: Otolaryngology

## 2019-12-31 ENCOUNTER — Other Ambulatory Visit: Payer: Self-pay

## 2019-12-31 ENCOUNTER — Encounter (HOSPITAL_COMMUNITY): Payer: Self-pay

## 2019-12-31 DIAGNOSIS — R9431 Abnormal electrocardiogram [ECG] [EKG]: Secondary | ICD-10-CM | POA: Diagnosis not present

## 2019-12-31 DIAGNOSIS — E119 Type 2 diabetes mellitus without complications: Secondary | ICD-10-CM | POA: Insufficient documentation

## 2019-12-31 DIAGNOSIS — Z01818 Encounter for other preprocedural examination: Secondary | ICD-10-CM | POA: Insufficient documentation

## 2019-12-31 HISTORY — DX: Anxiety disorder, unspecified: F41.9

## 2019-12-31 LAB — CBC
HCT: 38.3 % (ref 36.0–46.0)
Hemoglobin: 11.7 g/dL — ABNORMAL LOW (ref 12.0–15.0)
MCH: 24.4 pg — ABNORMAL LOW (ref 26.0–34.0)
MCHC: 30.5 g/dL (ref 30.0–36.0)
MCV: 80 fL (ref 80.0–100.0)
Platelets: 353 10*3/uL (ref 150–400)
RBC: 4.79 MIL/uL (ref 3.87–5.11)
RDW: 13.5 % (ref 11.5–15.5)
WBC: 12.2 10*3/uL — ABNORMAL HIGH (ref 4.0–10.5)
nRBC: 0 % (ref 0.0–0.2)

## 2019-12-31 LAB — BASIC METABOLIC PANEL
Anion gap: 13 (ref 5–15)
BUN: 8 mg/dL (ref 6–20)
CO2: 20 mmol/L — ABNORMAL LOW (ref 22–32)
Calcium: 9.1 mg/dL (ref 8.9–10.3)
Chloride: 104 mmol/L (ref 98–111)
Creatinine, Ser: 0.68 mg/dL (ref 0.44–1.00)
GFR calc Af Amer: >60 mL/min (ref >60)
GFR calc non Af Amer: >60 mL/min (ref >60)
Glucose, Bld: 128 mg/dL — ABNORMAL HIGH (ref 70–99)
Potassium: 4.2 mmol/L (ref 3.5–5.1)
Sodium: 137 mmol/L (ref 135–145)

## 2019-12-31 LAB — GLUCOSE, CAPILLARY: Glucose-Capillary: 113 mg/dL — ABNORMAL HIGH (ref 70–99)

## 2019-12-31 LAB — SARS CORONAVIRUS 2 (TAT 6-24 HRS): SARS Coronavirus 2: NEGATIVE

## 2019-12-31 LAB — HEMOGLOBIN A1C
Hgb A1c MFr Bld: 6.6 % — ABNORMAL HIGH (ref 4.8–5.6)
Mean Plasma Glucose: 142.72 mg/dL

## 2019-12-31 NOTE — Progress Notes (Signed)
PCP - Dr Dorthy Cooler    Cardiologist - na   st x-ray - 6/20 EKG - today Stress Test - na ECHO - 5/18 Cardiac Cath - na  Sleep Study - yes CPAP -yes  Fasting Blood Sugar - 113 Checks Blood Sugar __2___ times a day  Bl: Aspirin Instructions:  stop     COVID TEST-  today Anesthesia review: ekg  Patient denies shortness of breath, fever, cough and chest pain at PAT appointment   All instructions explained to the patient, with a verbal understanding of the material. Patient agrees to go over the instructions while at home for a better understanding. Patient also instructed to self quarantine after being tested for COVID-19. The opportunity to ask questions was provided.

## 2020-01-01 NOTE — Anesthesia Preprocedure Evaluation (Addendum)
Anesthesia Evaluation  Patient identified by MRN, date of birth, ID band Patient awake    Reviewed: Allergy & Precautions, NPO status , Patient's Chart, lab work & pertinent test results  History of Anesthesia Complications Negative for: history of anesthetic complications  Airway Mallampati: III  TM Distance: >3 FB Neck ROM: Full    Dental  (+) Dental Advisory Given, Teeth Intact   Pulmonary sleep apnea and Continuous Positive Airway Pressure Ventilation , former smoker,    Pulmonary exam normal        Cardiovascular negative cardio ROS Normal cardiovascular exam     Neuro/Psych PSYCHIATRIC DISORDERS Anxiety negative neurological ROS     GI/Hepatic negative GI ROS, Neg liver ROS,   Endo/Other  diabetes, Type 2, Oral Hypoglycemic Agents Obesity Right parotid mass   Renal/GU negative Renal ROS     Musculoskeletal negative musculoskeletal ROS (+)   Abdominal   Peds  Hematology  (+) anemia ,   Anesthesia Other Findings Covid neg 1/18  Reproductive/Obstetrics                            Anesthesia Physical Anesthesia Plan  ASA: II  Anesthesia Plan: General   Post-op Pain Management:    Induction: Intravenous  PONV Risk Score and Plan: 3 and Treatment may vary due to age or medical condition, Ondansetron, Dexamethasone and Midazolam  Airway Management Planned: Oral ETT  Additional Equipment: None  Intra-op Plan:   Post-operative Plan: Extubation in OR  Informed Consent: I have reviewed the patients History and Physical, chart, labs and discussed the procedure including the risks, benefits and alternatives for the proposed anesthesia with the patient or authorized representative who has indicated his/her understanding and acceptance.     Dental advisory given  Plan Discussed with: CRNA and Anesthesiologist  Anesthesia Plan Comments:        Anesthesia Quick  Evaluation

## 2020-01-02 ENCOUNTER — Ambulatory Visit (HOSPITAL_COMMUNITY): Payer: Managed Care, Other (non HMO) | Admitting: Physician Assistant

## 2020-01-02 ENCOUNTER — Ambulatory Visit (HOSPITAL_COMMUNITY)
Admission: RE | Admit: 2020-01-02 | Discharge: 2020-01-03 | Disposition: A | Discharge status: Home / Self Care | Payer: Managed Care, Other (non HMO) | Attending: Otolaryngology | Admitting: Otolaryngology

## 2020-01-02 ENCOUNTER — Other Ambulatory Visit: Payer: Self-pay

## 2020-01-02 ENCOUNTER — Encounter (HOSPITAL_COMMUNITY): Payer: Self-pay | Admitting: Otolaryngology

## 2020-01-02 ENCOUNTER — Encounter (HOSPITAL_COMMUNITY)
Admission: RE | Disposition: A | Discharge status: Home / Self Care | Payer: Self-pay | Source: Home / Self Care | Attending: Otolaryngology

## 2020-01-02 DIAGNOSIS — Z87891 Personal history of nicotine dependence: Secondary | ICD-10-CM | POA: Insufficient documentation

## 2020-01-02 DIAGNOSIS — K1123 Chronic sialoadenitis: Secondary | ICD-10-CM | POA: Diagnosis not present

## 2020-01-02 DIAGNOSIS — Z6837 Body mass index (BMI) 37.0-37.9, adult: Secondary | ICD-10-CM | POA: Diagnosis not present

## 2020-01-02 DIAGNOSIS — G4730 Sleep apnea, unspecified: Secondary | ICD-10-CM | POA: Insufficient documentation

## 2020-01-02 DIAGNOSIS — E669 Obesity, unspecified: Secondary | ICD-10-CM | POA: Diagnosis not present

## 2020-01-02 DIAGNOSIS — E119 Type 2 diabetes mellitus without complications: Secondary | ICD-10-CM | POA: Diagnosis not present

## 2020-01-02 DIAGNOSIS — Z7984 Long term (current) use of oral hypoglycemic drugs: Secondary | ICD-10-CM | POA: Insufficient documentation

## 2020-01-02 DIAGNOSIS — Z9049 Acquired absence of other specified parts of digestive tract: Secondary | ICD-10-CM

## 2020-01-02 HISTORY — PX: PAROTIDECTOMY: SHX2163

## 2020-01-02 LAB — GLUCOSE, CAPILLARY
Glucose-Capillary: 146 mg/dL — ABNORMAL HIGH (ref 70–99)
Glucose-Capillary: 155 mg/dL — ABNORMAL HIGH (ref 70–99)

## 2020-01-02 LAB — POCT PREGNANCY, URINE: Preg Test, Ur: NEGATIVE

## 2020-01-02 SURGERY — EXCISION, PAROTID GLAND
Anesthesia: General | Site: Face | Laterality: Right

## 2020-01-02 MED ORDER — SUCCINYLCHOLINE CHLORIDE 200 MG/10ML IV SOSY
PREFILLED_SYRINGE | INTRAVENOUS | Status: AC
  Filled 2020-01-02: qty 10

## 2020-01-02 MED ORDER — ONDANSETRON HCL 4 MG/2ML IJ SOLN
INTRAMUSCULAR | Status: DC | PRN
  Administered 2020-01-02: 4 mg via INTRAVENOUS

## 2020-01-02 MED ORDER — OXYCODONE HCL 5 MG PO TABS: 5.0000 mg | ORAL_TABLET | Freq: Once | ORAL | Status: DC | PRN

## 2020-01-02 MED ORDER — NORGESTIM-ETH ESTRAD TRIPHASIC 0.18/0.215/0.25 MG-35 MCG PO TABS: 1.0000 | ORAL_TABLET | Freq: Every day | ORAL | Status: DC

## 2020-01-02 MED ORDER — PROPOFOL 10 MG/ML IV BOLUS
INTRAVENOUS | Status: AC
  Filled 2020-01-02: qty 20

## 2020-01-02 MED ORDER — DEXAMETHASONE SODIUM PHOSPHATE 10 MG/ML IJ SOLN
INTRAMUSCULAR | Status: AC
  Filled 2020-01-02: qty 1

## 2020-01-02 MED ORDER — MORPHINE SULFATE (PF) 2 MG/ML IV SOLN
2.0000 mg | INTRAVENOUS | Status: DC | PRN
  Administered 2020-01-02: 2 mg via INTRAVENOUS
  Filled 2020-01-02: qty 1

## 2020-01-02 MED ORDER — OXYCODONE-ACETAMINOPHEN 5-325 MG PO TABS
1.0000 | ORAL_TABLET | ORAL | Status: DC | PRN
  Administered 2020-01-02 – 2020-01-03 (×3): 2 via ORAL
  Filled 2020-01-02 (×3): qty 2

## 2020-01-02 MED ORDER — FENTANYL CITRATE (PF) 100 MCG/2ML IJ SOLN
INTRAMUSCULAR | Status: DC | PRN
  Administered 2020-01-02 (×4): 50 ug via INTRAVENOUS
  Administered 2020-01-02: 100 ug via INTRAVENOUS
  Administered 2020-01-02: 50 ug via INTRAVENOUS

## 2020-01-02 MED ORDER — ONDANSETRON HCL 4 MG/2ML IJ SOLN: 4.0000 mg | INTRAMUSCULAR | Status: DC | PRN

## 2020-01-02 MED ORDER — OXYCODONE HCL 5 MG/5ML PO SOLN: 5.0000 mg | Freq: Once | ORAL | Status: DC | PRN

## 2020-01-02 MED ORDER — LIDOCAINE 2% (20 MG/ML) 5 ML SYRINGE
INTRAMUSCULAR | Status: DC | PRN
  Administered 2020-01-02: 80 mg via INTRAVENOUS

## 2020-01-02 MED ORDER — PROPOFOL 10 MG/ML IV BOLUS
INTRAVENOUS | Status: DC | PRN
  Administered 2020-01-02: 200 mg via INTRAVENOUS
  Administered 2020-01-02 (×2): 100 mg via INTRAVENOUS

## 2020-01-02 MED ORDER — APREMILAST 30 MG PO TABS: 30.0000 mg | ORAL_TABLET | Freq: Two times a day (BID) | ORAL | Status: DC

## 2020-01-02 MED ORDER — ESCITALOPRAM OXALATE 20 MG PO TABS
20.0000 mg | ORAL_TABLET | Freq: Every day | ORAL | Status: DC
  Administered 2020-01-03: 11:00:00 20 mg via ORAL
  Filled 2020-01-02: qty 1

## 2020-01-02 MED ORDER — 0.9 % SODIUM CHLORIDE (POUR BTL) OPTIME
TOPICAL | Status: DC | PRN
  Administered 2020-01-02: 1000 mL

## 2020-01-02 MED ORDER — MIDAZOLAM HCL 2 MG/2ML IJ SOLN
INTRAMUSCULAR | Status: AC
  Filled 2020-01-02: qty 2

## 2020-01-02 MED ORDER — FENTANYL CITRATE (PF) 250 MCG/5ML IJ SOLN
INTRAMUSCULAR | Status: AC
  Filled 2020-01-02: qty 5

## 2020-01-02 MED ORDER — METFORMIN HCL 500 MG PO TABS
1000.0000 mg | ORAL_TABLET | Freq: Two times a day (BID) | ORAL | Status: DC
  Administered 2020-01-02 – 2020-01-03 (×2): 1000 mg via ORAL
  Filled 2020-01-02 (×2): qty 2

## 2020-01-02 MED ORDER — LIDOCAINE-EPINEPHRINE 2 %-1:100000 IJ SOLN
INTRAMUSCULAR | Status: AC
  Filled 2020-01-02: qty 1

## 2020-01-02 MED ORDER — PROMETHAZINE HCL 25 MG/ML IJ SOLN: 6.2500 mg | INTRAMUSCULAR | Status: DC | PRN

## 2020-01-02 MED ORDER — KCL IN DEXTROSE-NACL 20-5-0.45 MEQ/L-%-% IV SOLN
INTRAVENOUS | Status: DC
  Filled 2020-01-02: qty 1000

## 2020-01-02 MED ORDER — CLINDAMYCIN PHOSPHATE 900 MG/50ML IV SOLN
900.0000 mg | INTRAVENOUS | Status: AC
  Administered 2020-01-02: 900 mg via INTRAVENOUS
  Filled 2020-01-02: qty 50

## 2020-01-02 MED ORDER — ALBUTEROL SULFATE HFA 108 (90 BASE) MCG/ACT IN AERS
INHALATION_SPRAY | RESPIRATORY_TRACT | Status: DC | PRN
  Administered 2020-01-02: 3 via RESPIRATORY_TRACT

## 2020-01-02 MED ORDER — MIDAZOLAM HCL 5 MG/5ML IJ SOLN
INTRAMUSCULAR | Status: DC | PRN
  Administered 2020-01-02: 2 mg via INTRAVENOUS

## 2020-01-02 MED ORDER — PROPOFOL 10 MG/ML IV BOLUS
INTRAVENOUS | Status: AC
  Filled 2020-01-02: qty 40

## 2020-01-02 MED ORDER — LIDOCAINE-EPINEPHRINE 2 %-1:100000 IJ SOLN
INTRAMUSCULAR | Status: DC | PRN
  Administered 2020-01-02: 4 mL via INTRADERMAL

## 2020-01-02 MED ORDER — DEXAMETHASONE SODIUM PHOSPHATE 10 MG/ML IJ SOLN
INTRAMUSCULAR | Status: DC | PRN
  Administered 2020-01-02: 5 mg via INTRAVENOUS

## 2020-01-02 MED ORDER — ONDANSETRON HCL 4 MG/2ML IJ SOLN
INTRAMUSCULAR | Status: AC
  Filled 2020-01-02: qty 2

## 2020-01-02 MED ORDER — LIDOCAINE 2% (20 MG/ML) 5 ML SYRINGE
INTRAMUSCULAR | Status: AC
  Filled 2020-01-02: qty 5

## 2020-01-02 MED ORDER — LACTATED RINGERS IV SOLN: INTRAVENOUS | Status: DC | PRN

## 2020-01-02 MED ORDER — PROPOFOL 500 MG/50ML IV EMUL
INTRAVENOUS | Status: DC | PRN
  Administered 2020-01-02: 25 ug/kg/min via INTRAVENOUS

## 2020-01-02 MED ORDER — ONDANSETRON HCL 4 MG PO TABS
4.0000 mg | ORAL_TABLET | ORAL | Status: DC | PRN
  Administered 2020-01-02: 15:00:00 4 mg via ORAL
  Filled 2020-01-02: qty 1

## 2020-01-02 MED ORDER — PHENYLEPHRINE HCL (PRESSORS) 10 MG/ML IV SOLN
INTRAVENOUS | Status: DC | PRN
  Administered 2020-01-02 (×3): 80 ug via INTRAVENOUS

## 2020-01-02 MED ORDER — ALBUTEROL SULFATE HFA 108 (90 BASE) MCG/ACT IN AERS
INHALATION_SPRAY | RESPIRATORY_TRACT | Status: AC
  Filled 2020-01-02: qty 6.7

## 2020-01-02 MED ORDER — CYCLOBENZAPRINE HCL 5 MG PO TABS: 5.0000 mg | ORAL_TABLET | Freq: Every day | ORAL | Status: DC | PRN

## 2020-01-02 MED ORDER — FENTANYL CITRATE (PF) 100 MCG/2ML IJ SOLN: 25.0000 ug | INTRAMUSCULAR | Status: DC | PRN

## 2020-01-02 MED ORDER — ATORVASTATIN CALCIUM 10 MG PO TABS
10.0000 mg | ORAL_TABLET | Freq: Every day | ORAL | Status: DC
  Administered 2020-01-03: 11:00:00 10 mg via ORAL
  Filled 2020-01-02: qty 1

## 2020-01-02 MED ORDER — CLINDAMYCIN HCL 300 MG PO CAPS: 300.0000 mg | ORAL_CAPSULE | Freq: Three times a day (TID) | ORAL | 0 refills | Status: AC

## 2020-01-02 MED ORDER — OXYCODONE-ACETAMINOPHEN 5-325 MG PO TABS: 1.0000 | ORAL_TABLET | ORAL | 0 refills | Status: DC | PRN

## 2020-01-02 MED ORDER — PROPOFOL 1000 MG/100ML IV EMUL
INTRAVENOUS | Status: AC
  Filled 2020-01-02: qty 100

## 2020-01-02 MED ORDER — SUCCINYLCHOLINE CHLORIDE 20 MG/ML IJ SOLN
INTRAMUSCULAR | Status: DC | PRN
  Administered 2020-01-02: 130 mg via INTRAVENOUS

## 2020-01-02 SURGICAL SUPPLY — 54 items
ADH SKN CLS APL DERMABOND .7 (GAUZE/BANDAGES/DRESSINGS) ×1
ATTRACTOMAT 16X20 MAGNETIC DRP (DRAPES) ×3 IMPLANT
BALL CTTN LRG ABS STRL LF (GAUZE/BANDAGES/DRESSINGS) ×1
BLADE SURG 15 STRL LF DISP TIS (BLADE) ×1 IMPLANT
BLADE SURG 15 STRL SS (BLADE) ×3
CANISTER SUCT 3000ML PPV (MISCELLANEOUS) ×3 IMPLANT
CLEANER TIP ELECTROSURG 2X2 (MISCELLANEOUS) ×2 IMPLANT
CONT SPEC 4OZ CLIKSEAL STRL BL (MISCELLANEOUS) ×3 IMPLANT
CORD BIPOLAR FORCEPS 12FT (ELECTRODE) ×3 IMPLANT
COTTONBALL LRG STERILE PKG (GAUZE/BANDAGES/DRESSINGS) ×3 IMPLANT
COVER SURGICAL LIGHT HANDLE (MISCELLANEOUS) ×3 IMPLANT
COVER WAND RF STERILE (DRAPES) ×1 IMPLANT
DERMABOND ADVANCED (GAUZE/BANDAGES/DRESSINGS) ×2
DERMABOND ADVANCED .7 DNX12 (GAUZE/BANDAGES/DRESSINGS) ×1 IMPLANT
DRAIN CHANNEL 10F 3/8 F FF (DRAIN) ×3 IMPLANT
DRAPE HALF SHEET 40X57 (DRAPES) IMPLANT
DRAPE SURG 17X23 STRL (DRAPES) ×3 IMPLANT
DRSG TEGADERM 2-3/8X2-3/4 SM (GAUZE/BANDAGES/DRESSINGS) ×2 IMPLANT
ELECT COATED BLADE 2.86 ST (ELECTRODE) ×3 IMPLANT
ELECT REM PT RETURN 9FT ADLT (ELECTROSURGICAL) ×3
ELECTRODE REM PT RTRN 9FT ADLT (ELECTROSURGICAL) ×1 IMPLANT
EVACUATOR SILICONE 100CC (DRAIN) ×3 IMPLANT
FORCEPS BIPOLAR SPETZLER 8 1.0 (NEUROSURGERY SUPPLIES) ×2 IMPLANT
GAUZE 4X4 16PLY RFD (DISPOSABLE) ×2 IMPLANT
GLOVE BIO SURGEON STRL SZ 6.5 (GLOVE) ×2 IMPLANT
GLOVE BIO SURGEON STRL SZ7 (GLOVE) ×2 IMPLANT
GLOVE BIO SURGEONS STRL SZ 6.5 (GLOVE) ×1
GLOVE BIOGEL PI IND STRL 6.5 (GLOVE) IMPLANT
GLOVE BIOGEL PI INDICATOR 6.5 (GLOVE) ×2
GLOVE ECLIPSE 6.5 STRL STRAW (GLOVE) ×2 IMPLANT
GLOVE ECLIPSE 7.5 STRL STRAW (GLOVE) ×3 IMPLANT
GOWN STRL REUS W/ TWL LRG LVL3 (GOWN DISPOSABLE) ×3 IMPLANT
GOWN STRL REUS W/TWL LRG LVL3 (GOWN DISPOSABLE) ×9
KIT BASIN OR (CUSTOM PROCEDURE TRAY) ×3 IMPLANT
KIT TURNOVER KIT B (KITS) ×3 IMPLANT
MARKER PEN SURG W/LABELS BLK (STERILIZATION PRODUCTS) ×2 IMPLANT
NDL HYPO 25GX1X1/2 BEV (NEEDLE) ×1 IMPLANT
NEEDLE HYPO 25GX1X1/2 BEV (NEEDLE) ×3 IMPLANT
NS IRRIG 1000ML POUR BTL (IV SOLUTION) ×3 IMPLANT
PAD ARMBOARD 7.5X6 YLW CONV (MISCELLANEOUS) ×3 IMPLANT
PENCIL BUTTON HOLSTER BLD 10FT (ELECTRODE) ×2 IMPLANT
POSITIONER HEAD DONUT 9IN (MISCELLANEOUS) ×3 IMPLANT
SHEARS HARMONIC 9CM CVD (BLADE) ×3 IMPLANT
SPONGE INTESTINAL PEANUT (DISPOSABLE) ×3 IMPLANT
SUT CHROMIC 4 0 PS 2 18 (SUTURE) IMPLANT
SUT PROLENE 5 0 P 3 (SUTURE) ×3 IMPLANT
SUT SILK 2 0 PERMA HAND 18 BK (SUTURE) IMPLANT
SUT SILK 3 0 (SUTURE) ×3
SUT SILK 3 0 SH CR/8 (SUTURE) ×3 IMPLANT
SUT SILK 3-0 18XBRD TIE 12 (SUTURE) ×1 IMPLANT
SUT VIC AB 4-0 RB1 27 (SUTURE) ×3
SUT VIC AB 4-0 RB1 27X BRD (SUTURE) ×1 IMPLANT
TOWEL GREEN STERILE FF (TOWEL DISPOSABLE) ×3 IMPLANT
TRAY ENT MC OR (CUSTOM PROCEDURE TRAY) ×3 IMPLANT

## 2020-01-02 NOTE — Anesthesia Procedure Notes (Signed)
Procedure Name: Intubation Date/Time: 01/02/2020 8:40 AM Performed by: Scheryl Darter, CRNA Pre-anesthesia Checklist: Patient identified, Emergency Drugs available, Suction available and Patient being monitored Patient Re-evaluated:Patient Re-evaluated prior to induction Oxygen Delivery Method: Circle System Utilized Preoxygenation: Pre-oxygenation with 100% oxygen Induction Type: IV induction Ventilation: Mask ventilation without difficulty Grade View: Grade I Tube type: Oral Tube size: 7.0 mm Number of attempts: 1 Airway Equipment and Method: Stylet and Oral airway Placement Confirmation: ETT inserted through vocal cords under direct vision,  positive ETCO2 and breath sounds checked- equal and bilateral Secured at: 22 cm Tube secured with: Tape Dental Injury: Teeth and Oropharynx as per pre-operative assessment

## 2020-01-02 NOTE — Progress Notes (Signed)
Placed patient on CPAP for the night via auto-mode.  

## 2020-01-02 NOTE — Op Note (Signed)
DATE OF PROCEDURE:  01/02/2020                              OPERATIVE REPORT  SURGEON:  Leta Baptist, MD  PREOPERATIVE DIAGNOSES: 1. Right parotid mass.  POSTOPERATIVE DIAGNOSES: 1. Right parotid mass.  PROCEDURE PERFORMED:  Right lateral parotidectomy with facial nerve dissection and preservation.  ANESTHESIA:  General endotracheal tube anesthesia.  COMPLICATIONS:  None.  ESTIMATED BLOOD LOSS:  50 ml.  INDICATION FOR PROCEDURE:  Denise Barnett is a 49 y.o. female with a 2 cm right parotid mass. Subsequent biopsies were inconclusive, suggestive of a granulomatous process. The patient was complaining of discomfort and heaviness along the right side of her face. Based on the above findings, the patient would like to undergo the above stated procedure.  The risks, benefits, alternatives, and details of the procedure were discussed with the patient.  Questions were invited and answered.  Informed consent was obtained.  DESCRIPTION:  The patient was taken to the operating room and placed supine on the operating table.  General endotracheal tube anesthesia was administered by the anesthesiologist.  The patient was positioned and prepped and draped in a standard fashion for right parotidectomy surgery.  Facial nerve monitoring electrodes were placed.  The facial nerve monitoring system was functional throughout the case.  1% lidocaine with 1-100,000 epinephrine was infiltrated at the planned site of incision.  A standard facelift incision was made on the right side.  The incision was carried down to the level of the SMAS layer. The SMAS flap was elevated in the standard fashion.  Careful dissection was carried out anterior to the right auricular cartilage.  The dissection was carried down to the level of the facial nerve.  The main trunk of the facial nerve and the superior and inferior branches were all dissected free from the parotid tissue.  All branches were noted to be functional throughout  the case.  The branches of the facial nerve was then dissected free from the parotid mass.  The superficial lobe of the gland was removed and sent to the pathology department for permanent histologic identification.  The surgical site was copiously irrigated.  A #10 JP drain was placed.  The incision was closed in layers with 4-0 Vicryl, 5-0 Prolene, and Dermabond.  The care of the patient was turned over to the anesthesiologist.  The patient was awakened from anesthesia without difficulty.  The patient was extubated and transferred to the recovery room in good condition.  OPERATIVE FINDINGS:  Right parotid mass.  SPECIMEN: Right parotid mass.  FOLLOWUP CARE:  The patient will be observed overnight.  The patient will follow up in my office in approximately 1 week. Denise Barnett 01/02/2020 12:19 PM

## 2020-01-02 NOTE — Transfer of Care (Signed)
Immediate Anesthesia Transfer of Care Note  Patient: STAR PRIMER  Procedure(s) Performed: RIGHT PAROTIDECTOMY (Right Face)  Patient Location: PACU  Anesthesia Type:General  Level of Consciousness: awake, alert , oriented and sedated  Airway & Oxygen Therapy: Patient Spontanous Breathing and Patient connected to face mask oxygen  Post-op Assessment: Report given to RN, Post -op Vital signs reviewed and stable and Patient moving all extremities  Post vital signs: Reviewed and stable  Last Vitals:  Vitals Value Taken Time  BP 141/79 01/02/20 1146  Temp    Pulse 115 01/02/20 1147  Resp 17 01/02/20 1147  SpO2 93 % 01/02/20 1147  Vitals shown include unvalidated device data.  Last Pain:  Vitals:   01/02/20 0722  TempSrc:   PainSc: 0-No pain         Complications: No apparent anesthesia complications

## 2020-01-02 NOTE — Anesthesia Postprocedure Evaluation (Signed)
Anesthesia Post Note  Patient: Denise Barnett  Procedure(s) Performed: RIGHT PAROTIDECTOMY (Right Face)     Patient location during evaluation: PACU Anesthesia Type: General Level of consciousness: awake and alert Pain management: pain level controlled Vital Signs Assessment: post-procedure vital signs reviewed and stable Respiratory status: spontaneous breathing, nonlabored ventilation, respiratory function stable and patient connected to nasal cannula oxygen Cardiovascular status: blood pressure returned to baseline, stable and tachycardic Postop Assessment: no apparent nausea or vomiting Anesthetic complications: no    Last Vitals:  Vitals:   01/02/20 1246 01/02/20 1314  BP: 132/64 131/75  Pulse: 100 98  Resp: 10 17  Temp:  37.2 C  SpO2: 90% 94%    Last Pain:  Vitals:   01/02/20 1314  TempSrc: Oral  PainSc:                  Audry Pili

## 2020-01-02 NOTE — H&P (Signed)
Cc: Right parotid mass  HPI: The patient is a 49 y/o female who presents today for evaluation of a right parotid mass. The patient is seen in consultation requested by [facility]. The patient first noticed swelling of her right parotid gland 5 months ago. She was treated by her PCP with antibiotics when some improvement but the swelling soon returned. The patient was seen at [facility] with an FNA performed. According to the patient, the specimen was inconclusive. The patient then underwent an ultrasound guided FNA which showed a 2 cm right parotid mass. Pathology was consistent with a granulomatous process. The patient is having discomfort and heaviness along the right side of her face. She also underwent a work for an autoimmune disease with no abnormal findings noted.   The patient's review of systems (constitutional, eyes, ENT, cardiovascular, respiratory, GI, musculoskeletal, skin, neurologic, psychiatric, endocrine, hematologic, allergic) is noted in the ROS questionnaire.  It is reviewed with the patient.   Family health history: Hearing loss, diabetes, heart disease.  Major events: None.  Ongoing medical problems: Diabetes, sleep apnea CPAP.  Social history: The patient is married. She denies the use of tobacco, alcohol or illegal drugs.   Exam: General: Communicates without difficulty, well nourished, no acute distress. Head: Normocephalic, no evidence injury, no tenderness, facial buttresses intact without stepoff. Eyes: PERRL, EOMI. No scleral icterus, conjunctivae clear. Neuro: CN II exam reveals vision grossly intact.  No nystagmus at any point of gaze. Ears: Auricles well formed without lesions.  Ear canals are intact without mass or lesion.  No erythema or edema is appreciated.  The TMs are intact without fluid. Nose: External evaluation reveals normal support and skin without lesions.  Dorsum is intact.  Anterior rhinoscopy reveals healthy pink mucosa over anterior aspect  of inferior turbinates and intact septum.  No purulence noted. Oral:  Oral cavity and oropharynx are intact, symmetric, without erythema or edema.  Mucosa is moist without lesions. Neck: Full range of motion without pain.  There is no significant lymphadenopathy.  No masses palpable.  Thyroid bed within normal limits to palpation. Submandibular glands equal bilaterally without mass. Palpable right parotid mass noted. Trachea is midline. Neuro:  CN 2-12 grossly intact. Gait normal. Vestibular: No nystagmus at any point of gaze. The cerebellar examination is unremarkable.   Assessment The patient has a right 2 cm parotid mass.   Plan  1. The patient's complex history, physical exam and CT findings are reviewed with the patient.  2. In light of her persistent symptoms, recommend right parotidectomy to remove the mass. The risks, benefits, alternatives, and details of the procedure are extensively reviewed with the patient. Questions are invited and answered. 3. The patient is interested in proceeding with the procedure.  We will schedule the procedure in accordance with the family schedule.

## 2020-01-02 NOTE — Discharge Instructions (Signed)
Parotidectomy, Care After This sheet gives you information about how to care for yourself after your procedure. Your health care provider may also give you more specific instructions. If you have problems or questions, contact your health care provider. What can I expect after the procedure? After the procedure, it is common to have:  Pain and mild swelling at the incision site.  Numbness along the incision.  Numbness in part or all of your ear.  Mild jaw discomfort on the surgical side when you are eating or chewing. This may last up to 2-4 weeks. Follow these instructions at home: Medicines   Take over-the-counter and prescription medicines only as told by your health care provider.  If you were prescribed an antibiotic medicine, take it as told by your health care provider. Do not stop taking the antibiotic even if you start to feel better.  Ask your health care provider if the medicine prescribed to you: ? Requires you to avoid driving or using heavy machinery. ? Can cause constipation. You may need to take actions to prevent or treat constipation, such as:  Drink enough fluid to keep your urine pale yellow.  Take over-the-counter or prescription medicines.  Eat foods that are high in fiber, such as beans, whole grains, and fresh fruits and vegetables.  Limit foods that are high in fat and processed sugars, such as fried or sweet foods. Incision care   Follow instructions from your health care provider about how to take care of your incision. Make sure you: ? Wash your hands with soap and water before and after you change your bandage (dressing). If soap and water are not available, use hand sanitizer. ? Change your dressing as told by your health care provider. ? Leave stitches (sutures), skin glue, or adhesive strips in place. These skin closures may need to be in place for 2 weeks or longer. If adhesive strip edges start to loosen and curl up, you may trim the loose edges.  Do not remove adhesive strips completely unless your health care provider tells you to do that.  Check your incision area every day for signs of infection. Check for: ? More redness, swelling, or pain. ? Fluid or blood. ? Warmth. ? Pus or a bad smell.  Follow your health care provider's instructions about cleaning and maintaining the drain that was placed near your incision. Eating and drinking  Follow instructions from your health care provider about eating or drinking restrictions.  If your mouth or jaw is sore, try eating soft foods until you feel better. Activity  Return to your normal activities as told by your health care provider. Ask your health care provider what activities are safe for you.  Rest as told by your health care provider.  Avoid sitting for a long time without moving. Get up to take short walks every 1-2 hours. This is important to improve blood flow and breathing. Ask for help if you feel weak or unsteady.  Do not lift anything that is heavier than 10 lb (4.5 kg), or the limit that you are told, until your health care provider says that it is safe. General instructions  Keep your head raised (elevated) when you lie down during the first few weeks after surgery. This will help prevent increased swelling.  Keep all follow-up visits as told by your health care provider. This is important. Contact a health care provider if:  You have pain that does not get better with medicine.  You have more redness,  swelling, or pain around your incision.  You have fluid or blood coming from your incision.  Your incision feels warm to the touch.  You have pus or a bad smell coming from your incision.  You vomit or feel nauseous.  You have a fever. Get help right away if:  You have more pain, swelling, or redness that suddenly gets worse at the incision site.  You have increasing numbness or weakness in your face.  You have severe pain. Summary  After the  procedure, it is common to have mild jaw discomfort on the surgical side when you are eating or chewing. This may last up to 2-4 weeks.  Follow instructions from your health care provider about how to take care of your incision.  If your mouth or jaw is sore, try eating soft foods until you feel better.  Return to your normal activities as told by your health care provider. Ask your health care provider what activities are safe for you. This information is not intended to replace advice given to you by your health care provider. Make sure you discuss any questions you have with your health care provider. Document Revised: 09/18/2018 Document Reviewed: 09/20/2018 Elsevier Patient Education  [place].

## 2020-01-03 DIAGNOSIS — K1123 Chronic sialoadenitis: Secondary | ICD-10-CM | POA: Diagnosis not present

## 2020-01-03 NOTE — Discharge Summary (Signed)
Physician Discharge Summary  Patient ID: Denise Barnett MRN: [id number] DOB/AGE: 05-18-1971 49 y.o.  Admit date: 01/02/2020 Discharge date: 01/03/2020  Admission Diagnoses: Right parotid mass  Discharge Diagnoses: Right parotid mass Active Problems:   H/O parotidectomy   Discharged Condition: good  Hospital Course: Pt had an uneventful overnight stay. Pt tolerated po well. No bleeding. No stridor. Facial nerve intact.  Consults: None  Significant Diagnostic Studies: None  Treatments: surgery: Right parotidectomy  Discharge Exam: Blood pressure 115/61, pulse 75, temperature 97.6 F (36.4 C), resp. rate 16, height 5\' 3"  (1.6 m), weight 94.8 kg, SpO2 97 %. Incision/Wound:c/d/i Facial function is normal.  Disposition: Discharge disposition: 01-Home or Self Care       Discharge Instructions    Activity as tolerated - No restrictions   Complete by: As directed    Diet general   Complete by: As directed      Allergies as of 01/03/2020      Reactions   Bactrim [sulfamethoxazole-trimethoprim] Rash   Penicillins Rash   Has patient had a PCN reaction causing immediate rash, facial/tongue/throat swelling, SOB or lightheadedness with hypotension: No Has patient had a PCN reaction causing severe rash involving mucus membranes or skin necrosis: Unknown Has patient had a PCN reaction that required hospitalization: No Has patient had a PCN reaction occurring within the last 10 years: No If all of the above answers are "NO", then may proceed with Cephalosporin use.      Medication List    TAKE these medications   Apremilast 30 MG Tabs Take 30 mg by mouth 2 (two) times daily.   atorvastatin 10 MG tablet Commonly known as: LIPITOR Take 10 mg by mouth daily.   clindamycin 300 MG capsule Commonly known as: CLEOCIN Take 1 capsule (300 mg total) by mouth 3 (three) times daily for 3 days.   clobetasol cream 0.05 % Commonly known as: TEMOVATE Apply 1 application topically  daily as needed (psoriasis).   cyclobenzaprine 10 MG tablet Commonly known as: FLEXERIL Take 5-10 mg by mouth daily as needed for muscle spasms.   escitalopram 20 MG tablet Commonly known as: LEXAPRO Take 20 mg by mouth daily.   metFORMIN 1000 MG tablet Commonly known as: GLUCOPHAGE Take 1,000 mg by mouth 2 (two) times daily with a meal.   Norgestimate-Ethinyl Estradiol Triphasic 0.18/0.215/0.25 MG-35 MCG tablet Take 1 tablet by mouth at bedtime.   oxyCODONE-acetaminophen 5-325 MG tablet Commonly known as: Percocet Take 1 tablet by mouth every 4 (four) hours as needed for severe pain.   valACYclovir 500 MG tablet Commonly known as: VALTREX Take 500 mg by mouth 2 (two) times daily as needed (cold sores).      Follow-up Information    Leta Baptist, MD On 01/09/2020.   Specialty: Otolaryngology Why: at [employer] information: [address] [place] [address] [phone]           Signed: Burley Saver 01/03/2020, 10:58 AM

## 2020-01-03 NOTE — Progress Notes (Signed)
Pt discharged home in stable condition 

## 2020-01-04 ENCOUNTER — Other Ambulatory Visit: Payer: Self-pay

## 2020-01-04 LAB — SURGICAL PATHOLOGY

## 2020-09-01 ENCOUNTER — Ambulatory Visit: Payer: Managed Care, Other (non HMO) | Admitting: Adult Health

## 2020-12-18 ENCOUNTER — Other Ambulatory Visit: Payer: Self-pay

## 2020-12-18 ENCOUNTER — Ambulatory Visit: Payer: Managed Care, Other (non HMO) | Admitting: Adult Health

## 2020-12-18 ENCOUNTER — Encounter: Payer: Self-pay | Admitting: Adult Health

## 2020-12-18 VITALS — BP 127/77 | HR 78 | Ht 63.0 in | Wt 195.0 lb

## 2020-12-18 DIAGNOSIS — Z9989 Dependence on other enabling machines and devices: Secondary | ICD-10-CM | POA: Diagnosis not present

## 2020-12-18 DIAGNOSIS — G4733 Obstructive sleep apnea (adult) (pediatric): Secondary | ICD-10-CM

## 2020-12-18 NOTE — Progress Notes (Addendum)
PATIENT: Denise Barnett DOB: 1971/10/14  REASON FOR VISIT: follow up HISTORY FROM: patient  HISTORY OF PRESENT ILLNESS: Today 12/18/20:  Ms. Penagos is a 50 year old female with a history of obstructive sleep apnea on CPAP.  Her download indicates that she use her machine nightly for compliance of 100%.  She is a machine greater than 4 hours each night.  On average she uses her machine 8 hours and 4 minutes.  Her residual AHI is 0.7 on 8 cm of water with EPR 3.  Leak in the 95th percentile is 7.4 L/min.  She reports that the CPAP works well for her however her husband still hears her snore and sometimes she puffs her cheeks when sleeping.  She currently uses the nasal pillows.  She returns today for an evaluation.  HISTORY (copied from Dr. Teofilo Pod note) 08/30/2019: I reviewed her CPAP compliance data from 07/31/2019 through 08/29/2019 which is a total of 30 days, during which time she used her machine every night with percent use days greater than 4 hours at 100%, indicating superb compliance with an average usage of 8 hours and 4 minutes, residual AHI at goal at 0.7/h, leak on the low side with a 95th percentile at 7.4 L/min on a pressure of 8 cm with EPR of 3. She reports doing well with her CPAP, she does need new supplies.  She uses a nasal mask, sometimes she notices air leaking from her mouth but otherwise it is going well.  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  She started Lexapro.  She has also noticed some 3 to 4 months ago a swelling in the right parotid gland area and neck area.  She had an ultrasound, she had a CT, she had consultation with ENT and also a fine-needle aspiration, no evidence of a cancerous lesion but no full resolution as to the swelling either.  She is discussing next steps with her primary care physician.  She has had left foot pain and bone spur issues,  had an injection with her podiatrist into her left heel and is wearing a boot.  She has started meloxicam 7.5 mg daily about a week ago.    REVIEW OF SYSTEMS: Out of a complete 14 system review of symptoms, the patient complains only of the following symptoms, and all other reviewed systems are negative.  ESS 10  ALLERGIES: Allergies  Allergen Reactions  . Bactrim [Sulfamethoxazole-Trimethoprim] Rash  . Penicillins Rash    Has patient had a PCN reaction causing immediate rash, facial/tongue/throat swelling, SOB or lightheadedness with hypotension: No Has patient had a PCN reaction causing severe rash involving mucus membranes or skin necrosis: Unknown Has patient had a PCN reaction that required hospitalization: No Has patient had a PCN reaction occurring within the last 10 years: No If all of the above answers are "NO", then may proceed with Cephalosporin use.     HOME MEDICATIONS: Outpatient Medications Prior to Visit  Medication Sig Dispense Refill  . Apremilast 30 MG TABS Take 30 mg by mouth 2 (two) times daily.     [name] atorvastatin (LIPITOR) 10 MG tablet Take 10 mg by mouth daily.    . clobetasol cream (TEMOVATE) 0.05 % Apply 1 application topically daily as needed (psoriasis).     . cyclobenzaprine (FLEXERIL) 10 MG tablet Take 5-10 mg by mouth daily as needed for muscle  spasms.     [name] escitalopram (LEXAPRO) 20 MG tablet Take 20 mg by mouth daily.    . metFORMIN (GLUCOPHAGE) 1000 MG tablet Take 1,000 mg by mouth 2 (two) times daily with a meal.   0  . Norgestimate-Ethinyl Estradiol Triphasic 0.18/0.215/0.25 MG-35 MCG tablet Take 1 tablet by mouth at bedtime.     . valACYclovir (VALTREX) 500 MG tablet Take 500 mg by mouth 2 (two) times daily as needed (cold sores).   2  . oxyCODONE-acetaminophen (PERCOCET) 5-325 MG tablet Take 1 tablet by mouth every 4 (four) hours as needed for severe pain. (Patient not taking: Reported on 12/18/2020) 20 tablet 0   No facility-administered  medications prior to visit.    PAST MEDICAL HISTORY: Past Medical History:  Diagnosis Date  . Anxiety   . Bronchitis   . Diabetes mellitus without complication ([place])    Type II  . Heel spur, left   . Incisional hernia   . Lichen planus   . OSA (obstructive sleep apnea) 12/25/2013   cpap- does not know settings   . Pneumonia    hx of     PAST SURGICAL HISTORY: Past Surgical History:  Procedure Laterality Date  . BILATERAL SALPINGECTOMY Left 09/15/2017   Procedure: LEFT SALPINGECTOMY;  Surgeon: Everitt Amber, MD;  [facility];  Service: Gynecology;  Laterality: Left;  . FOREIGN BODY REMOVAL ABDOMINAL N/A 11/07/2018   Procedure: REMOVAL FOREIGN BODY ABDOMINAL;  Surgeon: Ralene Ok, MD;  Location: [place];  Service: General;  Laterality: N/A;  . INCISIONAL HERNIA REPAIR N/A 05/29/2018   Procedure: LAPAROSCOPIC INCISIONAL HERNIA;  Surgeon: Ralene Ok, MD;  Location: [place];  Service: General;  Laterality: N/A;  . INSERTION OF MESH N/A 05/29/2018   Procedure: INSERTION OF MESH;  Surgeon: Ralene Ok, MD;  Location: [place];  Service: General;  Laterality: N/A;  . LAPAROSCOPY N/A 11/07/2018   Procedure: LAPAROSCOPY DIAGNOSTIC;  Surgeon: Ralene Ok, MD;  Location: [place];  Service: General;  Laterality: N/A;  . LAPAROTOMY Bilateral 09/15/2017   Procedure: EXPLORATORY LAPAROTOMY;  Surgeon: Everitt Amber, MD;  [facility];  Service: Gynecology;  Laterality: Bilateral;  . none    . OOPHORECTOMY Left 09/15/2017   Procedure: LEFT OOPHORECTOMY;  Surgeon: Everitt Amber, MD;  [facility];  Service: Gynecology;  Laterality: Left;  . PAROTIDECTOMY Right 01/02/2020   Procedure: RIGHT PAROTIDECTOMY;  Surgeon: Leta Baptist, MD;  Location: [place];  Service: ENT;  Laterality: Right;  [name] VIDEO BRONCHOSCOPY Bilateral 04/18/2017   Procedure: VIDEO BRONCHOSCOPY WITHOUT FLUORO;  Surgeon: Rush Farmer, MD;  Location: [facility];  Service: Endoscopy;  Laterality: Bilateral;    FAMILY  HISTORY: Family History  Problem Relation Age of Onset  . Sleep apnea Mother   . Heart attack Brother     SOCIAL HISTORY: Social History   Socioeconomic History  . Marital status: Married    Spouse name: Not on file  . Number of children: 1  . Years of education: 16  . Highest education level: Not on file  Occupational History  . Occupation: Scientific laboratory technician: [employer]  Tobacco Use  . Smoking status: Former Smoker    Packs/day: 0.75    Years: 20.00    Pack years: 15.00    Quit date: 2010    Years since quitting: 12.0  . Smokeless tobacco: Never Used  Vaping Use  . Vaping Use: Never used  Substance and Sexual Activity  . Alcohol use:  No  . Drug use: No  . Sexual activity: Yes  Other Topics Concern  . Not on file  Social History Narrative   Lives at home with husband and son   Right handed   Caffeine: 2 cups/day   Social Determinants of Health   Financial Resource Strain: Not on file  Food Insecurity: Not on file  Transportation Needs: Not on file  Physical Activity: Not on file  Stress: Not on file  Social Connections: Not on file  Intimate Partner Violence: Not on file      PHYSICAL EXAM  Vitals:   12/18/20 0900  BP: 127/77  Pulse: 78  Weight: 195 lb (88.5 kg)  Height: 5\' 3"  (1.6 m)   Body mass index is 34.54 kg/m.  Generalized: Well developed, in no acute distress  Chest: Lungs clear to auscultation bilaterally  Neurological examination  Mentation: Alert oriented to time, place, history taking. Follows all commands speech and language fluent Cranial nerve II-XII: Extraocular movements were full, visual field were full on confrontational test Head turning and shoulder shrug  were normal and symmetric. Motor: The motor testing reveals 5 over 5 strength of all 4 extremities. Good symmetric motor tone is noted throughout.  Sensory: Sensory testing is intact to soft touch on all 4 extremities. No evidence of extinction is noted.   Gait and station: Gait is normal.    DIAGNOSTIC DATA (LABS, IMAGING, TESTING) - I reviewed patient records, labs, notes, testing and imaging myself where available.  Lab Results  Component Value Date   WBC 12.2 (H) 12/31/2019   HGB 11.7 (L) 12/31/2019   HCT 38.3 12/31/2019   MCV 80.0 12/31/2019   PLT 353 12/31/2019      Component Value Date/Time   NA 137 12/31/2019 1027   K 4.2 12/31/2019 1027   CL 104 12/31/2019 1027   CO2 20 (L) 12/31/2019 1027   GLUCOSE 128 (H) 12/31/2019 1027   BUN 8 12/31/2019 1027   CREATININE 0.68 12/31/2019 1027   CREATININE 0.66 09/07/2018 1138   CALCIUM 9.1 12/31/2019 1027   PROT 8.2 (H) 09/20/2018 2037   ALBUMIN 4.0 09/20/2018 2037   AST 18 09/20/2018 2037   AST 16 09/07/2018 1138   ALT 14 09/20/2018 2037   ALT 20 09/07/2018 1138   ALKPHOS 80 09/20/2018 2037   BILITOT 0.5 09/20/2018 2037   BILITOT 0.5 09/07/2018 1138   GFRNONAA >60 12/31/2019 1027   GFRNONAA >60 09/07/2018 1138   GFRAA >60 12/31/2019 1027   GFRAA >60 09/07/2018 1138      ASSESSMENT AND PLAN 50 y.o. year old female  has a past medical history of Anxiety, Bronchitis, Diabetes mellitus without complication ([place]), Heel spur, left, Incisional hernia, Lichen planus, OSA (obstructive sleep apnea) (12/25/2013), and Pneumonia. here with:  1. OSA on CPAP  - CPAP compliance excellent - Good treatment of AHI  -Mask refitting patient will need to try mask that covers her mouth.  Pressure may need to be increased in the future - Encourage patient to use CPAP nightly and > 4 hours each night - F/U in 1 year or sooner if needed   I spent 20 minutes of face-to-face and non-face-to-face time with patient.  This included previsit chart review, lab review, study review, order entry, electronic health record documentation, patient education.  Ward Givens, MSN, NP-C 12/18/2020, 9:04 AM [facility] [address] [place], [place] [address] [phone]  I  reviewed the above note and documentation by the  Nurse Practitioner and agree with the history, exam, assessment and plan as outlined above. I was available for consultation. Star Age, MD, PhD [facility])

## 2020-12-18 NOTE — Patient Instructions (Signed)
Continue using CPAP nightly and greater than 4 hours each night Mask refitting ordered If your symptoms worsen or you develop new symptoms please let us know.   

## 2020-12-22 NOTE — Progress Notes (Signed)
community message sent to [facility] for orders

## 2021-02-16 ENCOUNTER — Telehealth: Payer: Self-pay | Admitting: Hematology

## 2021-02-16 NOTE — Telephone Encounter (Signed)
Scheduled appointments per 03/04 schedule message. Contacted patient, patient is aware.

## 2021-03-08 NOTE — Progress Notes (Signed)
HEMATOLOGY/ONCOLOGY CLINIC NOTE  Date of Service: 03/09/2021  Patient Care Team: Lujean Amel, MD as PCP - General (Family Medicine)  CHIEF COMPLAINTS/PURPOSE OF CONSULTATION:  IDA  HISTORY OF PRESENTING ILLNESS:   Denise Barnett is a wonderful 50 y.o. female who has been referred to [place] by Dr. Lujean Amel  for evaluation and management of Leukocytosis. The pt reports that she is doing well overall.   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 She was first diagnosed with psoriasis when she was 50 years old. She also took Plaquenil for about 9 years in the past. She initially began medication a few years ago when she developed lichen planus. She took steroids as well for a week or two at this time.    The pt reports that she has been told she has had elevated white blood cells for many years and that it has not progressed. She is currently taking topical steroids.   The pt notes that she had hernia surgery in June and endorses some occasional abdominal pain when at work. The pt notes that her wound continues to drain intermittently.   She also had a bug bite in May/June and was prescribed antibiotics and steroids, her subsequent WBC count at that time was higher than her baseline at 16.9k.   The pt notes that she has had diarrhea, intermittently urgently, and several times a day. She also notes intermittent depression which does not concern her very much at this time.   Most recent lab results (08/01/18) of CBC w/diff and CMP is as follows: all values are WNL except for WBC at 11.7k, HGB at 11.9, HCT at 36.6, MCV at 78.3, MCH at 25.5, ANC at 8.0k, Monocytes abs at 900, Glucose at 112, Creatinine at 0.55, Sodium at 135.  On review of systems, pt reports occasional joint issues, diarrhea, stable weight, some  depression, occasional abdominal pains, and denies fevers, chills, night sweats, unexpected weight loss, frequent infections, and any other symptoms.   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 Laparotomy with left ovarian cystectomy, oophorectomy left. She denies thyroid problems.  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   INTERVAL HISTORY  Denise Barnett is a wonderful 50 y.o. female who is is here today for evaluation and management of lDA. The patient's last visit with [place] was on 09/07/2018. The pt reports that she is doing well overall.  The pt reports that she had a colonoscopy, capsule endoscopy, and endoscopy that all came back negative. The pt is unsure as to why she has been iron deficient. The pt reports that two of her three stool tests came back with some blood in it. The pt was started on ferrous sulfate 325 mg daily, but had to stop due to the surgeries. The pt had the colonoscopy and endoscopy in December 2021 and the Capsule recently last month. The pt was tolerating the iron well. The pt notes she is still on the Apremilast and this is  controlling her psoriasis well.  Lab results today 03/09/2021 of CBC w/diff and CMP is as follows: all values are WNL except for WBC of 12.2K, Monocytes Abs of 1.2K, Eosinophils Abs of 0.8K. CMP pending. 03/09/2021 Iron pending. 03/09/2021 Ferritin pending.  On review of systems, pt reports mild fatigue and denies back pain, abdominal pain, bloody/black stools, leg swelling, and any other symptoms.  MEDICAL HISTORY:  Past Medical History:  Diagnosis Date  . Anxiety   .  Bronchitis   . Diabetes mellitus without complication ([place])    Type II  . Heel spur, left   . Incisional hernia   . Lichen planus   . OSA (obstructive sleep apnea) 12/25/2013   cpap- does not know settings   . Pneumonia    hx of     SURGICAL HISTORY: Past Surgical History:  Procedure Laterality Date  . BILATERAL SALPINGECTOMY Left 09/15/2017   Procedure: LEFT SALPINGECTOMY;  Surgeon: Everitt Amber, MD;  [facility];  Service: Gynecology;  Laterality: Left;  . FOREIGN BODY REMOVAL ABDOMINAL N/A 11/07/2018   Procedure: REMOVAL FOREIGN BODY ABDOMINAL;  Surgeon: Ralene Ok, MD;  Location: [place];  Service: General;  Laterality: N/A;  . INCISIONAL HERNIA REPAIR N/A 05/29/2018   Procedure: LAPAROSCOPIC INCISIONAL HERNIA;  Surgeon: Ralene Ok, MD;  Location: [place];  Service: General;  Laterality: N/A;  . INSERTION OF MESH N/A 05/29/2018   Procedure: INSERTION OF MESH;  Surgeon: Ralene Ok, MD;  Location: [place];  Service: General;  Laterality: N/A;  . LAPAROSCOPY N/A 11/07/2018   Procedure: LAPAROSCOPY DIAGNOSTIC;  Surgeon: Ralene Ok, MD;  Location: [place];  Service: General;  Laterality: N/A;  . LAPAROTOMY Bilateral 09/15/2017   Procedure: EXPLORATORY LAPAROTOMY;  Surgeon: Everitt Amber, MD;  [facility];  Service: Gynecology;  Laterality: Bilateral;  . none    . OOPHORECTOMY Left 09/15/2017   Procedure: LEFT OOPHORECTOMY;  Surgeon: Everitt Amber, MD;  [facility];  Service: Gynecology;  Laterality: Left;  . PAROTIDECTOMY Right 01/02/2020   Procedure: RIGHT PAROTIDECTOMY;  Surgeon: Leta Baptist, MD;  Location: [place];  Service: ENT;  Laterality: Right;  [name] VIDEO BRONCHOSCOPY Bilateral 04/18/2017   Procedure: VIDEO BRONCHOSCOPY WITHOUT FLUORO;  Surgeon: Rush Farmer, MD;  Location: [facility];  Service: Endoscopy;  Laterality: Bilateral;    SOCIAL HISTORY: Social History   Socioeconomic History  . Marital status: Married    Spouse name: Not on file  . Number of  children: 1  . Years of education: 7  . Highest education level: Not on file  Occupational History  . Occupation: Scientific laboratory technician: [employer]  Tobacco Use  . Smoking status: Former Smoker    Packs/day: 0.75    Years: 20.00    Pack years: 15.00    Quit date: 2010    Years since quitting: 12.2  . Smokeless tobacco: Never Used  Vaping Use  . Vaping Use: Never used  Substance and Sexual Activity  . Alcohol use: No  . Drug use: No  . Sexual activity: Yes  Other Topics Concern  . Not on file  Social History Narrative   Lives at home with husband and son   Right handed   Caffeine: 2 cups/day   Social Determinants of Health   Financial Resource Strain: Not on file  Food Insecurity: Not on file  Transportation Needs: Not on file  Physical Activity: Not on file  Stress: Not on file  Social Connections: Not on  file  Intimate Partner Violence: Not on file    FAMILY HISTORY: Family History  Problem Relation Age of Onset  . Sleep apnea Mother   . Heart attack Brother     ALLERGIES:  is allergic to bactrim [sulfamethoxazole-trimethoprim] and penicillins.  MEDICATIONS:  Current Outpatient Medications  Medication Sig Dispense Refill  . Apremilast 30 MG TABS Take 30 mg by mouth 2 (two) times daily.     [name] atorvastatin (LIPITOR) 10 MG tablet Take 10 mg by mouth daily.    . clobetasol cream (TEMOVATE) 3.15 % Apply 1 application topically daily as needed (psoriasis).     . cyclobenzaprine (FLEXERIL) 10 MG tablet Take 5-10 mg by mouth daily as needed for muscle spasms.     [name] escitalopram (LEXAPRO) 20 MG tablet Take 20 mg by mouth daily.    . metFORMIN (GLUCOPHAGE) 1000 MG tablet Take 1,000 mg by mouth 2 (two) times daily with a meal.   0  . Norgestimate-Ethinyl Estradiol Triphasic 0.18/0.215/0.25 MG-35 MCG tablet Take 1 tablet by mouth at bedtime.     . valACYclovir (VALTREX) 500 MG tablet Take 500 mg by mouth 2 (two) times daily as needed (cold sores).   2   No  current facility-administered medications for this visit.    REVIEW OF SYSTEMS:   10 Point review of Systems was done is negative except as noted above.  PHYSICAL EXAMINATION: . Vitals:   03/09/21 1431  BP: 118/67  Pulse: 75  Resp: 17  Temp: (!) 97.5 F (36.4 C)  SpO2: 100%   Filed Weights   03/09/21 1431  Weight: 202 lb 14.4 oz (92 kg)   .Body mass index is 35.94 kg/m.  GENERAL:alert, in no acute distress and comfortable SKIN: no acute rashes, no significant lesions EYES: conjunctiva are pink and non-injected, sclera anicteric OROPHARYNX: MMM, no exudates, no oropharyngeal erythema or ulceration NECK: supple, no JVD LYMPH:  no palpable lymphadenopathy in the cervical, axillary or inguinal regions LUNGS: clear to auscultation b/l with normal respiratory effort HEART: regular rate & rhythm ABDOMEN:  normoactive bowel sounds , non tender, not distended. Extremity: no pedal edema PSYCH: alert & oriented x 3 with fluent speech NEURO: no focal motor/sensory deficits  LABORATORY DATA:  I have reviewed the data as listed  . CBC Latest Ref Rng & Units 03/09/2021 12/31/2019 08/07/2019  WBC 4.0 - 10.5 K/uL 12.2(H) 12.2(H) 10.7(H)  Hemoglobin 12.0 - 15.0 g/dL 12.4 11.7(L) 11.7(L)  Hematocrit 36.0 - 46.0 % 37.3 38.3 36.9  Platelets 150 - 400 K/uL 359 353 351    . CBC    Component Value Date/Time   WBC 12.2 (H) 03/09/2021 1417   WBC 12.2 (H) 12/31/2019 1027   RBC 4.62 03/09/2021 1417   HGB 12.4 03/09/2021 1417   HCT 37.3 03/09/2021 1417   PLT 359 03/09/2021 1417   MCV 80.7 03/09/2021 1417   MCH 26.8 03/09/2021 1417   MCHC 33.2 03/09/2021 1417   RDW 14.1 03/09/2021 1417   LYMPHSABS 2.7 03/09/2021 1417   MONOABS 1.2 (H) 03/09/2021 1417   EOSABS 0.8 (H) 03/09/2021 1417   BASOSABS 0.1 03/09/2021 1417   . CMP Latest Ref Rng & Units 03/09/2021 12/31/2019 10/30/2018  Glucose 70 - 99 mg/dL 123(H) 128(H) 136(H)  BUN 6 - 20 mg/dL 12 8 11   Creatinine 0.44 - 1.00 mg/dL 0.68  0.68 0.57  Sodium 135 - 145 mmol/L 137 137 135  Potassium 3.5 - 5.1 mmol/L 3.9 4.2 4.1  Chloride 98 - 111  mmol/L 104 104 105  CO2 22 - 32 mmol/L 20(L) 20(L) 22  Calcium 8.9 - 10.3 mg/dL 8.7(L) 9.1 8.9  Total Protein 6.5 - 8.1 g/dL 7.7 - -  Total Bilirubin 0.3 - 1.2 mg/dL 0.5 - -  Alkaline Phos 38 - 126 U/L 93 - -  AST 15 - 41 U/L 17 - -  ALT 0 - 44 U/L 17 - -   . Lab Results  Component Value Date   IRON 46 03/09/2021   TIBC 529 (H) 03/09/2021   IRONPCTSAT 9 (L) 03/09/2021   (Iron and TIBC)  Lab Results  Component Value Date   FERRITIN 9 (L) 03/09/2021     08/01/18 CBC w/diff:    RADIOGRAPHIC STUDIES: I have personally reviewed the radiological images as listed and agreed with the findings in the report. No results found.  ASSESSMENT & PLAN:  50 y.o. female with  1. Leukocytosis - likely reactive  Neutrophilia and mild eosinophilia Likely from psoariasis related inflammation, steroid use and surgery. unliley to represent MPN  2. Diarrhea and depression while on [place] -Recommend that the PCP and dermatologist continue to watch for infections while pt is on [place] that PCP and dermatologist consider pt's diarrhea and depression concerns as she is currently taking Otezla and monitor for medication management. Patient was encourage to discuss this concern with PCP and dermatologist.  PLAN:  -Discussed patient's most recent labs from today, 03/09/2021; WBC stable, other counts normal.  -Advised pt her WBC have remained stable for three years.  -Advised pt that she is not anemic at this time and her Hgb has been increasing. -Recommended pt continue oral iron once daily. Can increase to BID if tolerate it well. -Advised pt we can get IV iron if deficiency is severe or cannot tolerate oral iron. -Will see back as needed. Pt is aware to get labs rechecked with PCP in 3 months.  . No orders of the defined types were placed in this encounter.   FOLLOW  UP: RTC with Dr Irene Limbo as needed   All of the patients questions were answered with apparent satisfaction. The patient knows to call the clinic with any problems, questions or concerns.  The total time spent in the appointment was 20 minutes and more than 50% was on counseling and direct patient cares.    Sullivan Lone MD [place] AAHIVMS [facility] Hematology/Oncology Physician [facility]  (Office):       [phone] (Work cell):  [phone] (Fax):           [phone]  03/09/2021 2:39 PM  I, Reinaldo Raddle, am acting as scribe for Dr. Sullivan Lone, MD.    .I have reviewed the above documentation for accuracy and completeness, and I agree with the above. Brunetta Genera MD

## 2021-03-09 ENCOUNTER — Inpatient Hospital Stay (HOSPITAL_BASED_OUTPATIENT_CLINIC_OR_DEPARTMENT_OTHER): Payer: Managed Care, Other (non HMO) | Admitting: Hematology

## 2021-03-09 ENCOUNTER — Other Ambulatory Visit: Payer: Self-pay

## 2021-03-09 ENCOUNTER — Telehealth: Payer: Self-pay | Admitting: Hematology

## 2021-03-09 ENCOUNTER — Inpatient Hospital Stay: Payer: Managed Care, Other (non HMO) | Attending: Hematology

## 2021-03-09 ENCOUNTER — Other Ambulatory Visit: Payer: Self-pay | Admitting: *Deleted

## 2021-03-09 VITALS — BP 118/67 | HR 75 | Temp 97.5°F | Resp 17 | Ht 63.0 in | Wt 202.9 lb

## 2021-03-09 DIAGNOSIS — F32A Depression, unspecified: Secondary | ICD-10-CM | POA: Diagnosis not present

## 2021-03-09 DIAGNOSIS — R197 Diarrhea, unspecified: Secondary | ICD-10-CM | POA: Insufficient documentation

## 2021-03-09 DIAGNOSIS — E611 Iron deficiency: Secondary | ICD-10-CM

## 2021-03-09 DIAGNOSIS — D509 Iron deficiency anemia, unspecified: Secondary | ICD-10-CM | POA: Diagnosis not present

## 2021-03-09 DIAGNOSIS — D72829 Elevated white blood cell count, unspecified: Secondary | ICD-10-CM | POA: Diagnosis not present

## 2021-03-09 DIAGNOSIS — D649 Anemia, unspecified: Secondary | ICD-10-CM

## 2021-03-09 DIAGNOSIS — Z79899 Other long term (current) drug therapy: Secondary | ICD-10-CM | POA: Diagnosis not present

## 2021-03-09 LAB — CBC WITH DIFFERENTIAL (CANCER CENTER ONLY)
Abs Immature Granulocytes: 0.07 10*3/uL (ref 0.00–0.07)
Basophils Absolute: 0.1 10*3/uL (ref 0.0–0.1)
Basophils Relative: 1 %
Eosinophils Absolute: 0.8 10*3/uL — ABNORMAL HIGH (ref 0.0–0.5)
Eosinophils Relative: 7 %
HCT: 37.3 % (ref 36.0–46.0)
Hemoglobin: 12.4 g/dL (ref 12.0–15.0)
Immature Granulocytes: 1 %
Lymphocytes Relative: 22 %
Lymphs Abs: 2.7 10*3/uL (ref 0.7–4.0)
MCH: 26.8 pg (ref 26.0–34.0)
MCHC: 33.2 g/dL (ref 30.0–36.0)
MCV: 80.7 fL (ref 80.0–100.0)
Monocytes Absolute: 1.2 10*3/uL — ABNORMAL HIGH (ref 0.1–1.0)
Monocytes Relative: 10 %
Neutro Abs: 7.4 10*3/uL (ref 1.7–7.7)
Neutrophils Relative %: 59 %
Platelet Count: 359 10*3/uL (ref 150–400)
RBC: 4.62 MIL/uL (ref 3.87–5.11)
RDW: 14.1 % (ref 11.5–15.5)
WBC Count: 12.2 10*3/uL — ABNORMAL HIGH (ref 4.0–10.5)
nRBC: 0 % (ref 0.0–0.2)

## 2021-03-09 LAB — CMP (CANCER CENTER ONLY)
ALT: 17 U/L (ref 0–44)
AST: 17 U/L (ref 15–41)
Albumin: 3.6 g/dL (ref 3.5–5.0)
Alkaline Phosphatase: 93 U/L (ref 38–126)
Anion gap: 13 (ref 5–15)
BUN: 12 mg/dL (ref 6–20)
CO2: 20 mmol/L — ABNORMAL LOW (ref 22–32)
Calcium: 8.7 mg/dL — ABNORMAL LOW (ref 8.9–10.3)
Chloride: 104 mmol/L (ref 98–111)
Creatinine: 0.68 mg/dL (ref 0.44–1.00)
GFR, Estimated: >60 mL/min (ref >60)
Glucose, Bld: 123 mg/dL — ABNORMAL HIGH (ref 70–99)
Potassium: 3.9 mmol/L (ref 3.5–5.1)
Sodium: 137 mmol/L (ref 135–145)
Total Bilirubin: 0.5 mg/dL (ref 0.3–1.2)
Total Protein: 7.7 g/dL (ref 6.5–8.1)

## 2021-03-09 LAB — IRON AND TIBC
Iron: 46 ug/dL (ref 41–142)
Saturation Ratios: 9 % — ABNORMAL LOW (ref 21–57)
TIBC: 529 ug/dL — ABNORMAL HIGH (ref 236–444)
UIBC: 482 ug/dL — ABNORMAL HIGH (ref 120–384)

## 2021-03-09 LAB — SAMPLE TO BLOOD BANK

## 2021-03-09 LAB — FERRITIN: Ferritin: 9 ng/mL — ABNORMAL LOW (ref 11–307)

## 2021-03-09 NOTE — Telephone Encounter (Signed)
Checked out appointment. No LOS notes needing to be scheduled. No changes made. 

## 2021-03-16 ENCOUNTER — Encounter: Payer: Self-pay | Admitting: Hematology

## 2021-03-18 ENCOUNTER — Other Ambulatory Visit: Payer: Self-pay | Admitting: Hematology

## 2021-03-18 DIAGNOSIS — D508 Other iron deficiency anemias: Secondary | ICD-10-CM

## 2021-03-18 DIAGNOSIS — D509 Iron deficiency anemia, unspecified: Secondary | ICD-10-CM | POA: Insufficient documentation

## 2021-05-12 ENCOUNTER — Ambulatory Visit (INDEPENDENT_AMBULATORY_CARE_PROVIDER_SITE_OTHER): Payer: Managed Care, Other (non HMO)

## 2021-05-12 ENCOUNTER — Encounter: Payer: Self-pay | Admitting: Podiatry

## 2021-05-12 ENCOUNTER — Ambulatory Visit: Payer: Managed Care, Other (non HMO) | Admitting: Podiatry

## 2021-05-12 ENCOUNTER — Other Ambulatory Visit: Payer: Self-pay

## 2021-05-12 DIAGNOSIS — M19071 Primary osteoarthritis, right ankle and foot: Secondary | ICD-10-CM | POA: Diagnosis not present

## 2021-05-12 DIAGNOSIS — L409 Psoriasis, unspecified: Secondary | ICD-10-CM | POA: Diagnosis not present

## 2021-05-12 DIAGNOSIS — M722 Plantar fascial fibromatosis: Secondary | ICD-10-CM

## 2021-05-12 DIAGNOSIS — M19079 Primary osteoarthritis, unspecified ankle and foot: Secondary | ICD-10-CM

## 2021-05-12 MED ORDER — BETAMETHASONE SOD PHOS & ACET 6 (3-3) MG/ML IJ SUSP
6.0000 mg | Freq: Once | INTRAMUSCULAR | Status: AC
  Administered 2021-05-12: 6 mg

## 2021-05-12 NOTE — Progress Notes (Signed)
  Subjective:  Patient ID: Denise Barnett, female    DOB: November 15, 1971,  MRN: [id number]  Chief Complaint  Patient presents with  . Foot Pain    Bilateral foot pain located at the top of both feet. Left foot pain worse than right. Sharp shooting pains radiating to lower leg. Pt states she has been seeing Dr. Gershon Mussel for her foot pain but he only gives injections for pain and no alternatives to eliminate the pain. Last injectio given 03/2021.    50 y.o. female presents with the above complaint. History confirmed with patient. Also endorses history of psoriasis. On Otezla.   Never had an injection to the top of the foot.  Objective:  Physical Exam: warm, good capillary refill, no trophic changes or ulcerative lesions, normal DP and PT pulses and normal sensory exam. Xerosis without active psoriasis plaques bilat. Left Foot: POP midfoot with prominent osteophytes  Right Foot: POP midfoot with prominent osteophytes  No images are attached to the encounter.  Radiographs: X-ray of both feet: midfoot degnerative changes. Posterior and plantar heel spurring. No acute fractures noted. Assessment:   1. Plantar fasciitis, bilateral   2. Arthritis of midfoot   3. Psoriasis    Plan:  Patient was evaluated and treated and all questions answered.  Arthritis -XR reviewed with patient -Injection delivered to the painful joint -Discussed lacing techniques to avoid dorsal midfoot pressure -Consider surgical excision of spurring if issues persist.  Procedure: Joint Injection Location: Bilateral 2nd TMT joint Skin Prep: Alcohol. Injectate: 0.5 cc 1% lidocaine plain, 0.5 cc betamethasone acetate-betamethasone sodium phosphate Disposition: Patient tolerated procedure well. Injection site dressed with a band-aid.   Return in about 4 weeks (around 06/09/2021) for Arthritis.

## 2021-06-12 ENCOUNTER — Other Ambulatory Visit: Payer: Self-pay

## 2021-06-12 ENCOUNTER — Ambulatory Visit: Payer: Managed Care, Other (non HMO) | Admitting: Podiatry

## 2021-06-12 DIAGNOSIS — M19079 Primary osteoarthritis, unspecified ankle and foot: Secondary | ICD-10-CM | POA: Diagnosis not present

## 2021-06-12 DIAGNOSIS — L603 Nail dystrophy: Secondary | ICD-10-CM

## 2021-06-12 DIAGNOSIS — M722 Plantar fascial fibromatosis: Secondary | ICD-10-CM

## 2021-06-12 NOTE — Progress Notes (Signed)
  Subjective:  Patient ID: Denise Barnett, female    DOB: 15-Aug-1971,  MRN: [id number]  Chief Complaint  Patient presents with   Foot Pain    Pt states she is doing well today, injections very helpful.   50 y.o. female presents with the above complaint. History confirmed with patient. Also endorses history of psoriasis. On Otezla.   Never had an injection to the top of the foot.  Objective:  Physical Exam: warm, good capillary refill, no trophic changes or ulcerative lesions, normal DP and PT pulses and normal sensory exam. Xerosis without active psoriasis plaques bilat. Left Foot: No POP midfoot with prominent osteophytes  Right Foot: No POP midfoot with prominent osteophytes  right 2nd toenail dystrophic increased height of the nails. Assessment:   1. Plantar fasciitis, bilateral   2. Arthritis of midfoot   3. Nail dystrophy     Plan:  Patient was evaluated and treated and all questions answered.  Arthritis -Improving. NO injection today. F/u should pain persist  Nail dystrophy right -Debrided. Does not appear to be fungal. No other medications indicated.   Return if symptoms worsen or fail to improve.

## 2021-06-17 ENCOUNTER — Telehealth: Payer: Self-pay | Admitting: Adult Health

## 2021-06-17 DIAGNOSIS — Z9989 Dependence on other enabling machines and devices: Secondary | ICD-10-CM

## 2021-06-17 NOTE — Telephone Encounter (Signed)
Pt called stating she needs to start the process of getting a new CPAP machine, says she has had hers for 5 years and she is getting a message on the machine about the motor. Pt also states that she cannot find the sim card to the CPAP machine. Requesting a call back. Please advise.

## 2021-06-17 NOTE — Telephone Encounter (Signed)
Since has been greater than 5 years since her original sleep study we will repeat a home sleep test.  Pending those results we will then order her a new machine.  I have already placed the order for home sleep test

## 2021-06-17 NOTE — Telephone Encounter (Signed)
Patient was last seen January 2022 for CPAP compliance visit.  Ordered mask re-fit. Pending f/u 12/22/21.

## 2021-06-17 NOTE — Telephone Encounter (Signed)
Spoke with patient and advised that since it has been over 5 years since her original sleep study, we do need to repeat a home sleep test and pending those results we can order her new machine.  Patient aware this has already been ordered and she will await a call from the sleep team.  I did ask her to give [place] a call if she does not hear from our office in 2 weeks.  She verbalized understanding/agreement to plan and appreciation for the call back.

## 2021-07-02 NOTE — Telephone Encounter (Signed)
I called pt and advised via vm we got her message and that we would be in touch as soon as the Sleep lab has obtained authorization along with appt date to offer. Pt advised to cb if she had any questions.

## 2021-07-02 NOTE — Telephone Encounter (Signed)
Pt states she was told if she did not hear anything from [place] within 2 weeks to give [place] a call back. Please call pt back.

## 2021-07-21 ENCOUNTER — Other Ambulatory Visit: Payer: Self-pay

## 2021-07-21 ENCOUNTER — Ambulatory Visit: Payer: Managed Care, Other (non HMO) | Admitting: Podiatry

## 2021-07-21 DIAGNOSIS — M19079 Primary osteoarthritis, unspecified ankle and foot: Secondary | ICD-10-CM

## 2021-07-21 DIAGNOSIS — M19072 Primary osteoarthritis, left ankle and foot: Secondary | ICD-10-CM

## 2021-07-21 DIAGNOSIS — M722 Plantar fascial fibromatosis: Secondary | ICD-10-CM

## 2021-07-21 MED ORDER — BETAMETHASONE SOD PHOS & ACET 6 (3-3) MG/ML IJ SUSP
9.0000 mg | Freq: Once | INTRAMUSCULAR | Status: AC
  Administered 2021-07-21: 9 mg

## 2021-07-21 NOTE — Progress Notes (Signed)
  Subjective:  Patient ID: Denise Barnett, female    DOB: 1971-06-05,  MRN: [id number]  Chief Complaint  Patient presents with   Plantar Fasciitis    Follow up left foot pain pt states pain has increased. Left over right. Associated with light edema in bilateral ankles.    50 y.o. female presents with the above complaint. History confirmed with patient. States the pain came back left but the injections helped. Objective:  Physical Exam: warm, good capillary refill, no trophic changes or ulcerative lesions, normal DP and PT pulses and normal sensory exam. Xerosis without active psoriasis plaques bilat. Left Foot:  POP midfoot with prominent osteophytes, medial calc tuber Right Foot: No POP midfoot with prominent osteophytes  right 2nd toenail dystrophic increased height of the nails. Assessment:   1. Plantar fasciitis, bilateral   2. Arthritis of midfoot    Plan:  Patient was evaluated and treated and all questions answered.  Arthritis, Plantar fasciitis -Repeat injections as above.  Procedure: Joint Injection Location: Left dorsal 2nd TMT joint Skin Prep: Alcohol. Injectate: 0.5 cc 1% lidocaine plain, 0.5 cc betamethasone acetate-betamethasone sodium phosphate Disposition: Patient tolerated procedure well. Injection site dressed with a band-aid.  Procedure: Injection Tendon/Ligament Consent: Verbal consent obtained. Location: Left plantar fascia at the glabrous junction; medial approach. Skin Prep: Alcohol. Injectate: 1 cc 0.5% marcaine plain, 1 cc betamethasone acetate-betamethasone sodium phosphate Disposition: Patient tolerated procedure well. Injection site dressed with a band-aid.  No follow-ups on file.

## 2021-08-10 ENCOUNTER — Other Ambulatory Visit (HOSPITAL_BASED_OUTPATIENT_CLINIC_OR_DEPARTMENT_OTHER): Payer: Self-pay | Admitting: Obstetrics

## 2021-08-10 ENCOUNTER — Ambulatory Visit (INDEPENDENT_AMBULATORY_CARE_PROVIDER_SITE_OTHER): Payer: Managed Care, Other (non HMO) | Admitting: Neurology

## 2021-08-10 DIAGNOSIS — Z1231 Encounter for screening mammogram for malignant neoplasm of breast: Secondary | ICD-10-CM

## 2021-08-10 DIAGNOSIS — G4733 Obstructive sleep apnea (adult) (pediatric): Secondary | ICD-10-CM

## 2021-08-12 ENCOUNTER — Ambulatory Visit (INDEPENDENT_AMBULATORY_CARE_PROVIDER_SITE_OTHER): Payer: Managed Care, Other (non HMO)

## 2021-08-12 ENCOUNTER — Other Ambulatory Visit: Payer: Self-pay

## 2021-08-12 DIAGNOSIS — Z1231 Encounter for screening mammogram for malignant neoplasm of breast: Secondary | ICD-10-CM | POA: Diagnosis not present

## 2021-08-12 IMAGING — MG MM DIGITAL SCREENING BILAT W/ TOMO AND CAD
8 series · 8 of 24 positions shown · non-contrast
Comparison: Previous exam(s).

CLINICAL DATA: Screening.

EXAM:
DIGITAL SCREENING BILATERAL MAMMOGRAM WITH TOMOSYNTHESIS AND CAD
TECHNIQUE: Bilateral screening digital craniocaudal and mediolateral oblique
mammograms were obtained. Bilateral screening digital breast
tomosynthesis was performed. The images were evaluated with
computer-aided detection.

[R CC synth-2D]
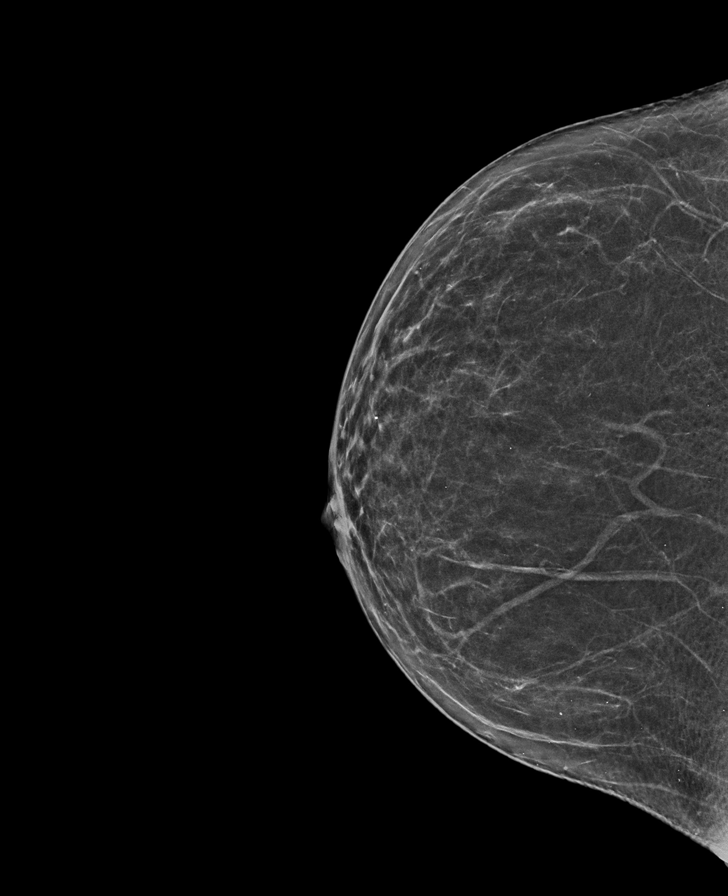

[L CC synth-2D]
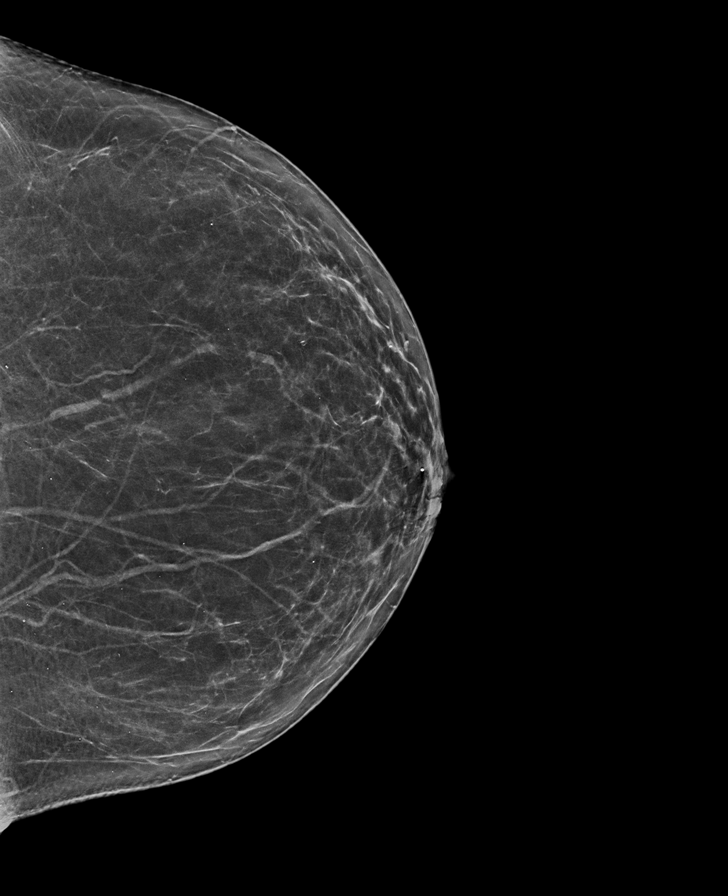

[R MLO synth-2D]
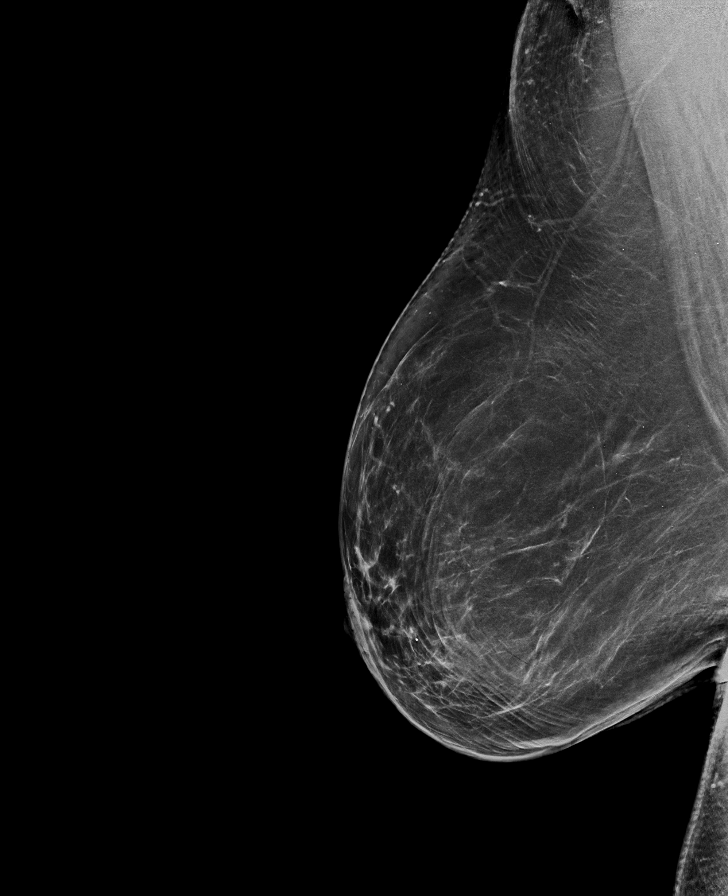

[L MLO synth-2D]
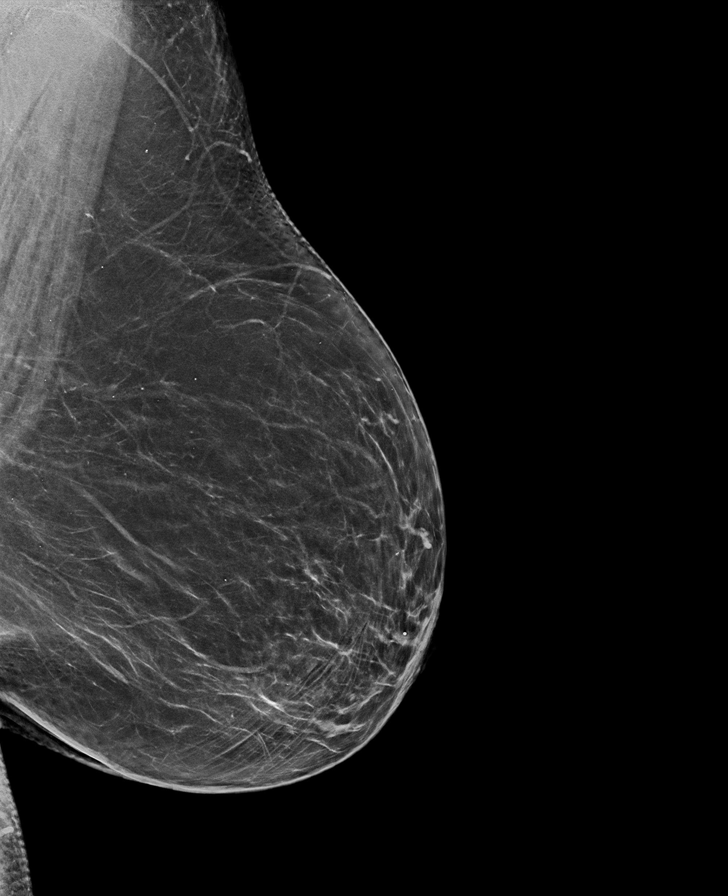

[R MLO tomo · tomo slice 45/88.0]
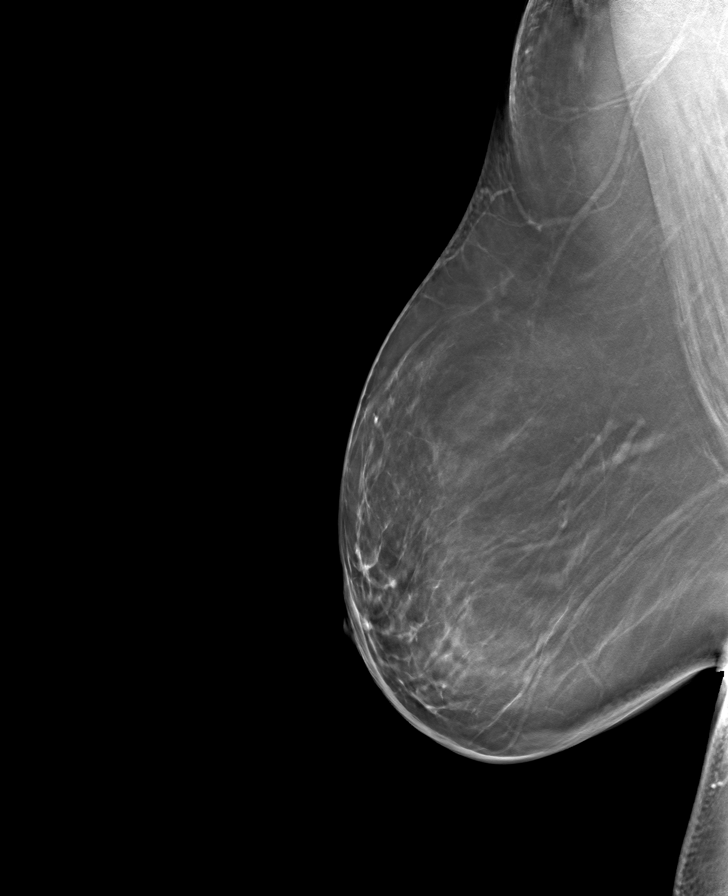

[L CC tomo · tomo slice 35/68.0]
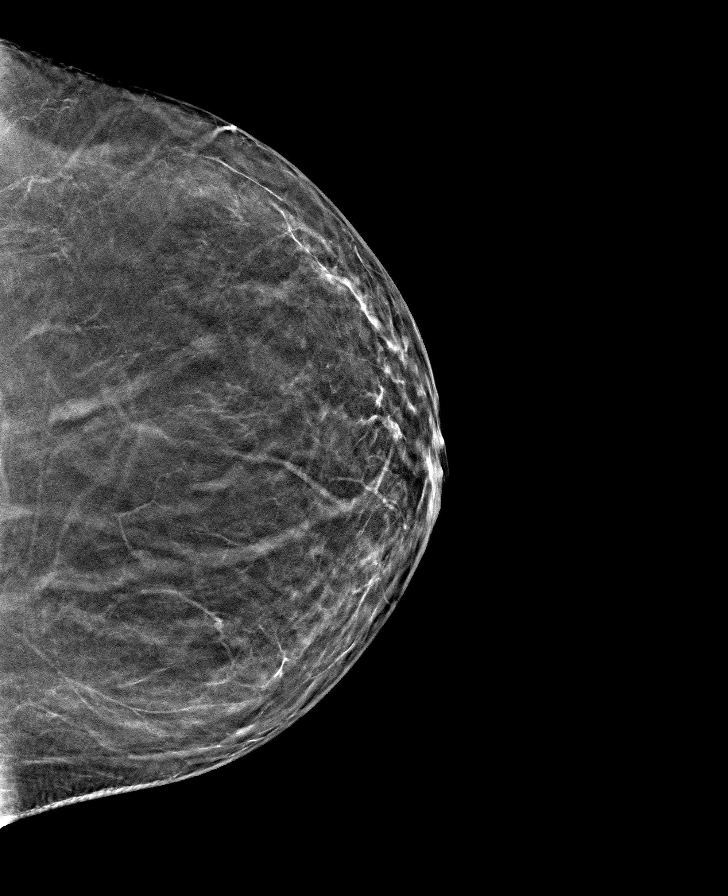

[L MLO tomo · tomo slice 41/82.0]
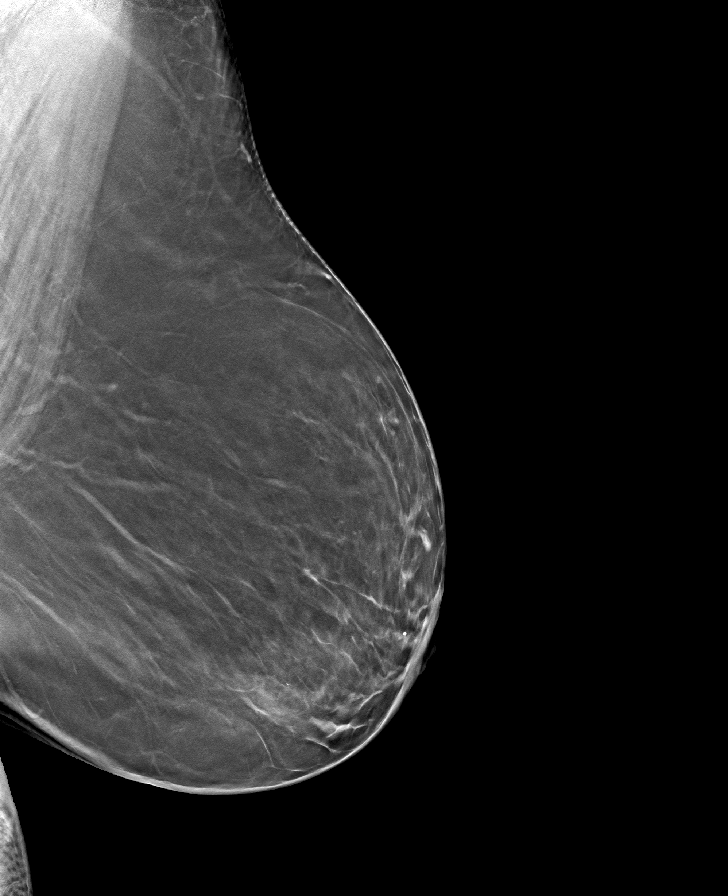

[R CC tomo · tomo slice 31/62.0]
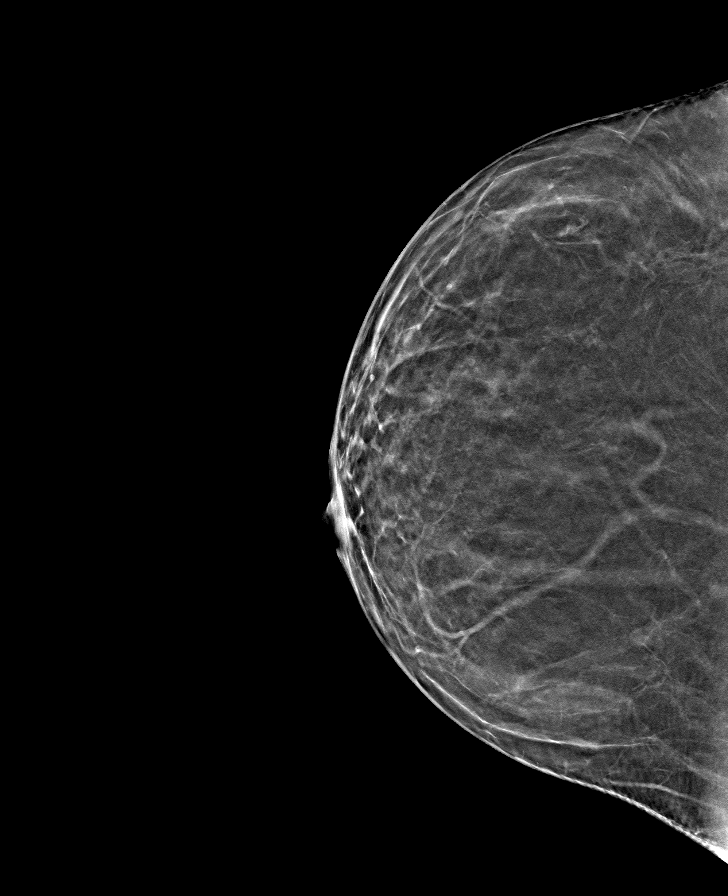

[8 of 24 positions shown; findings below may reference images not displayed]

ACR Breast Density Category b: There are scattered areas of
fibroglandular density.
FINDINGS: There are no findings suspicious for malignancy.
IMPRESSION: No mammographic evidence of malignancy. A result letter of this
screening mammogram will be mailed directly to the patient.

RECOMMENDATION:
Screening mammogram in one year. (Code:[BY])

BI-RADS CATEGORY  1: Negative.

## 2021-08-20 ENCOUNTER — Telehealth: Payer: Self-pay | Admitting: Neurology

## 2021-08-20 DIAGNOSIS — G4733 Obstructive sleep apnea (adult) (pediatric): Secondary | ICD-10-CM

## 2021-08-20 NOTE — Procedures (Signed)
     [facility]  HOME SLEEP TEST (Watch PAT) REPORT  STUDY DATE: 08/10/2021  DOB: 1970/12/23  MRN: [id number]  ORDERING CLINICIAN: Star Age, MD, PhD   REFERRING CLINICIAN: Ward Givens, NP  CLINICAL INFORMATION/HISTORY: 50 year old female with an underlying medical history of diabetes, lichen planus, history of pneumonia, bronchitis, anxiety, and obesity, who presents for reevaluation of her obstructive sleep apnea.  She has been on CPAP therapy.  She qualifies for new equipment.  Epworth sleepiness score: 10/24.  BMI: 34.8 kg/m  FINDINGS:   Sleep Summary:   Total Recording Time (hours, min): 9 hours, 15 minutes  Total Sleep Time (hours, min):  4 hours, 47 minutes   Percent REM (%):    31.9%   Respiratory Indices:   Calculated pAHI (per hour):  25.3/hour         REM pAHI:    28.2/hour       NREM pAHI: 23.9/hour  Oxygen Saturation Statistics:    Oxygen Saturation (%) Mean: 93%   Minimum oxygen saturation (%):                 85%   O2 Saturation Range (%): 85-98%    O2 Saturation (minutes) <=88%: 1.2 min  Pulse Rate Statistics:   Pulse Mean (bpm):    91/min    Pulse Range (69-133/min)   IMPRESSION: OSA (obstructive sleep apnea)   RECOMMENDATION:  This home sleep test demonstrates moderate obstructive sleep apnea with a total AHI of 25.3/hour and O2 nadir of 85%.  Snoring was detected, and the moderate to moderate range.  Ongoing treatment with positive airway pressure is recommended. The patient will be advised to proceed with an autoPAP titration/trial at home for now. A full night titration study may be considered to optimize treatment settings, if needed down the road. Please note that untreated obstructive sleep apnea may carry additional perioperative morbidity. Patients with significant obstructive sleep apnea should receive perioperative PAP therapy and the surgeons and particularly the anesthesiologist should be informed of the  diagnosis and the severity of the sleep disordered breathing. The patient should be cautioned not to drive, work at heights, or operate dangerous or heavy equipment when tired or sleepy. Review and reiteration of good sleep hygiene measures should be pursued with any patient. Other causes of the patient's symptoms, including circadian rhythm disturbances, an underlying mood disorder, medication effect and/or an underlying medical problem cannot be ruled out based on this test. Clinical correlation is recommended. The patient and her referring provider will be notified of the test results. The patient will be seen in follow up in sleep clinic at [facility].  I certify that I have reviewed the raw data recording prior to the issuance of this report in accordance with the standards of the American Academy of Sleep Medicine (AASM).  INTERPRETING PHYSICIAN:   Star Age, MD, PhD  Board Certified in Neurology and Sleep Medicine  [facility] [address], [place], [place] [address] [phone]

## 2021-08-20 NOTE — Telephone Encounter (Signed)
Patient had a sleep apnea follow-up visit in January 2022 with you.  She qualified for new equipment and had a home sleep test on 08/10/2021.  Please advise patient that she has moderate obstructive sleep apnea based on her most recent home sleep test results and should qualify for new equipment.  Please write for an AutoPap machine with a range of 6 to 12 cm and mask of choice.  You can also discuss with her ongoing use of CPAP with a new machine, you can set her pressure at 8 cm which has been her current CPAP pressure.  Please arrange for a follow-up within 31 to 89 days with 1 of [place].

## 2021-08-20 NOTE — Progress Notes (Signed)
See procedure note.

## 2021-08-24 NOTE — Telephone Encounter (Signed)
Please discuss results with the patient.  If she wants a new machine order can be placed.  She can either stay on 8 cm of water which is where she is at now or we can switch her to AutoSet 6 to 12 cm of water.  This is up to the patient.  Thanks

## 2021-08-24 NOTE — Telephone Encounter (Signed)
Spoke with patient and discussed the sleep study results and recommendations of either switching to AutoPap 6-12 cm of water or staying at CPAP 8 cm of water.  The patient's questions were answered.  She wanted to know what Megan NP felt would be best and Megan recommended patient try the auto PAP.  Patient is in agreement.  She is aware the order will be sent over to [facility].  She will await a new machine pending insurance approval.  She also understands an appointment will be needed for insurance compliance 31 to 89 days after she starts her new machine.  Patient is comfortable excepting responsibility of calling to schedule the appointment as soon as she gets her new machine as she is not sure when she will receive it.

## 2021-08-25 NOTE — Telephone Encounter (Signed)
Secure message sent to [facility] re: new machine order.

## 2021-08-25 NOTE — Telephone Encounter (Signed)
Order placed

## 2021-08-25 NOTE — Addendum Note (Signed)
Addended by: Trudie Buckler on: 08/25/2021 09:11 AM   Modules accepted: Orders

## 2021-08-26 NOTE — Telephone Encounter (Signed)
I will place an order for CPAP of 8 cm.  She has previously done well on CPAP therapy.

## 2021-08-26 NOTE — Addendum Note (Signed)
Addended by: Star Age on: 08/26/2021 04:14 PM   Modules accepted: Orders

## 2021-09-11 ENCOUNTER — Ambulatory Visit: Payer: Managed Care, Other (non HMO) | Admitting: Podiatry

## 2021-10-20 ENCOUNTER — Other Ambulatory Visit: Payer: Self-pay

## 2021-10-20 ENCOUNTER — Ambulatory Visit: Payer: Managed Care, Other (non HMO) | Admitting: Podiatry

## 2021-10-20 DIAGNOSIS — M722 Plantar fascial fibromatosis: Secondary | ICD-10-CM

## 2021-10-20 DIAGNOSIS — M19079 Primary osteoarthritis, unspecified ankle and foot: Secondary | ICD-10-CM

## 2021-10-20 MED ORDER — BETAMETHASONE SOD PHOS & ACET 6 (3-3) MG/ML IJ SUSP
12.0000 mg | Freq: Once | INTRAMUSCULAR | Status: AC
Start: 2021-10-20 — End: 2021-10-20
  Administered 2021-10-20: 12 mg

## 2021-10-20 NOTE — Progress Notes (Signed)
  Subjective:  Patient ID: Denise Barnett, female    DOB: Jul 22, 1971,  MRN: [id number]  Chief Complaint  Patient presents with   Foot Pain    Bilateral foot pain plantar fasciitis. Right foot is worse. Pt is requesting injection for right foot   50 y.o. female presents with the above complaint. History confirmed with patient. States the pain came back left but the injections helped. Objective:  Physical Exam: warm, good capillary refill, no trophic changes or ulcerative lesions, normal DP and PT pulses and normal sensory exam. Xerosis without active psoriasis plaques bilat. Left Foot:  POP midfoot with prominent osteophytes, no POP medial calc tuber Right Foot: POP midfoot with prominent osteophytes  right 2nd toenail dystrophic increased height of the nails. No POP medial calc tuber Assessment:   1. Plantar fasciitis, bilateral   2. Arthritis of midfoot    Plan:  Patient was evaluated and treated and all questions answered.  Arthritis, Plantar fasciitis -Repeat injections as below. -Should pain persist consider MRI heel to evaluate the PF. May benefit from EPF, midfoot exostectomy  Procedure: Injection Tendon/Ligament Consent: Verbal consent obtained. Location: Right plantar fascia at the glabrous junction; medial approach. Skin Prep: Alcohol. Injectate: 1 cc 0.5% marcaine plain, 1 cc betamethasone acetate-betamethasone sodium phosphate Disposition: Patient tolerated procedure well. Injection site dressed with a band-aid.   Procedure: Joint Injection Location: Bilateral dorsal 2nd TMT joint Skin Prep: Alcohol. Injectate: 0.5 cc 1% lidocaine plain, 0.5 cc betamethasone acetate-betamethasone sodium phosphate Disposition: Patient tolerated procedure well. Injection site dressed with a band-aid.    Return in about 6 weeks (around 12/01/2021) for Plantar fasciitis.

## 2021-11-09 ENCOUNTER — Encounter: Payer: Self-pay | Admitting: Hematology

## 2021-11-10 ENCOUNTER — Telehealth: Payer: Self-pay

## 2021-11-10 NOTE — Telephone Encounter (Signed)
Contacted pt regarding her MyChart message (rash). Encouraged pt to contact her Dermatologist that anemia did not normally cause pt's to have a rash. Pt verbalized understanding.

## 2021-11-23 ENCOUNTER — Encounter: Payer: Self-pay | Admitting: Neurology

## 2021-11-25 NOTE — Telephone Encounter (Signed)
Denyse Amass, RN got it!      Previous Messages   ----- Message -----  From: Brandon Melnick, RN  Sent: 11/23/2021   3:34 PM EST  To: Ocie Bob, *  Subject: autopap                                         Good afternoon,   New order 08-25-2021 for auto pap [place], [place]  Female, 50 y.o., 10-Sep-1971  Last BMI:  35.9  MRN:  [id number]  Want AUTOPAP .  Thanks   Winnie

## 2021-12-01 ENCOUNTER — Other Ambulatory Visit: Payer: Self-pay

## 2021-12-01 ENCOUNTER — Ambulatory Visit: Payer: Managed Care, Other (non HMO) | Admitting: Podiatry

## 2021-12-01 ENCOUNTER — Encounter: Payer: Self-pay | Admitting: Podiatry

## 2021-12-01 DIAGNOSIS — K1120 Sialoadenitis, unspecified: Secondary | ICD-10-CM | POA: Insufficient documentation

## 2021-12-01 DIAGNOSIS — D72829 Elevated white blood cell count, unspecified: Secondary | ICD-10-CM | POA: Insufficient documentation

## 2021-12-01 DIAGNOSIS — M722 Plantar fascial fibromatosis: Secondary | ICD-10-CM | POA: Diagnosis not present

## 2021-12-01 DIAGNOSIS — R252 Cramp and spasm: Secondary | ICD-10-CM | POA: Insufficient documentation

## 2021-12-01 DIAGNOSIS — M19079 Primary osteoarthritis, unspecified ankle and foot: Secondary | ICD-10-CM | POA: Diagnosis not present

## 2021-12-01 DIAGNOSIS — F418 Other specified anxiety disorders: Secondary | ICD-10-CM | POA: Insufficient documentation

## 2021-12-01 DIAGNOSIS — E7800 Pure hypercholesterolemia, unspecified: Secondary | ICD-10-CM | POA: Insufficient documentation

## 2021-12-01 DIAGNOSIS — R195 Other fecal abnormalities: Secondary | ICD-10-CM | POA: Insufficient documentation

## 2021-12-01 DIAGNOSIS — D71 Functional disorders of polymorphonuclear neutrophils: Secondary | ICD-10-CM | POA: Insufficient documentation

## 2021-12-01 NOTE — Progress Notes (Signed)
°  Subjective:  Patient ID: Denise Barnett, female    DOB: March 25, 1971,  MRN: [id number]  Chief Complaint  Patient presents with   Plantar Fasciitis    Follow up PF bilateral (R>L)  The shot helped for a very short time, still bothering me quite a bit"   50 y.o. female presents with the above complaint. History confirmed with patient. Objective:  Physical Exam: warm, good capillary refill, no trophic changes or ulcerative lesions, normal DP and PT pulses and normal sensory exam. Xerosis without active psoriasis plaques bilat. Left Foot:  POP midfoot with prominent osteophytes, no POP medial calc tuber Right Foot: POP midfoot with prominent osteophytes  right 2nd toenail dystrophic increased height of the nails. No POP medial calc tuber Assessment:   1. Plantar fasciitis, bilateral   2. Arthritis of midfoot    Plan:  Patient was evaluated and treated and all questions answered.  Arthritis, Plantar fasciitis -Hold off further injection today -Will order MRI to evaluate the plantar fascia -Will likely need EPF and midfoot exostectomy. Discussed both procedures in brief with patient today and expected operative course  Return in about 3 weeks (around 12/22/2021) for Plantar fasciitis MRI Review and possible surgical planning.

## 2021-12-09 ENCOUNTER — Telehealth: Payer: Self-pay | Admitting: Adult Health

## 2021-12-09 NOTE — Telephone Encounter (Signed)
Pt was scheduled for Initial CPAP visit on 03/04/22 Pt was informed to bring machine and power cord to appt DME: [facility] Phone: [phone], press option 1 Fax: [phone] Equipment issued ; Luna G3 on 12/08/21 Pt to be scheduled between: 01/08/22 - 03/08/22)

## 2021-12-09 NOTE — Telephone Encounter (Signed)
LVM for pt to call back to schedule appt

## 2021-12-18 ENCOUNTER — Telehealth: Payer: Self-pay | Admitting: Podiatry

## 2021-12-18 NOTE — Telephone Encounter (Signed)
Can you inform her if she has it dones Sunday I should have the results for her Tuesday appointment. And unfortunately we generally do only one foot at a time - insurance does not generally cover both at the same time

## 2021-12-18 NOTE — Telephone Encounter (Signed)
Patient wanted to inform Dr. March Rummage that her MRI is scheduled for Sunday and wanted to know if he would received the results by then?  She wanted to know if she could get the MRI on the left foot as well?

## 2021-12-20 ENCOUNTER — Ambulatory Visit
Admission: RE | Admit: 2021-12-20 | Discharge: 2021-12-20 | Disposition: A | Discharge status: Home / Self Care | Payer: Managed Care, Other (non HMO) | Source: Ambulatory Visit | Attending: Podiatry | Admitting: Podiatry

## 2021-12-20 ENCOUNTER — Other Ambulatory Visit: Payer: Self-pay

## 2021-12-20 DIAGNOSIS — M722 Plantar fascial fibromatosis: Secondary | ICD-10-CM

## 2021-12-20 IMAGING — MR MR HEEL *R* W/O CM
5 series · 40 of 40 positions shown · non-contrast
Comparison: None.

CLINICAL DATA: Chronic right heel pain. Pain along the dorsal
aspect. Pain for several years. No known injury.

EXAM:
MR OF THE RIGHT HEEL WITHOUT CONTRAST
TECHNIQUE: Multiplanar, multisequence MR imaging of the right heel was
performed. No intravenous contrast was administered.

[Series 4: T2 fat-sat · axial · 3.0mm · 0.50mm/px · z∈[-98,+23]mm · 9 of 32 slices shown (1 of 2)]
[im 1/32]
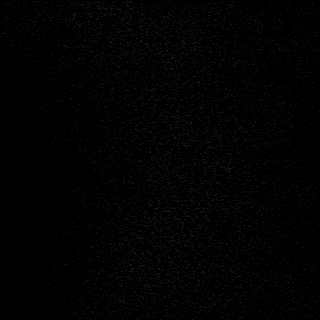
[im 4/32]
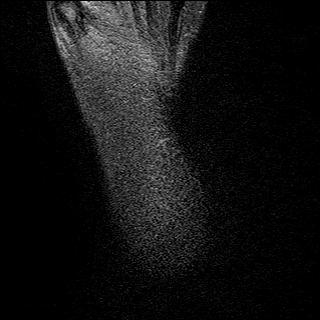
[im 8/32]
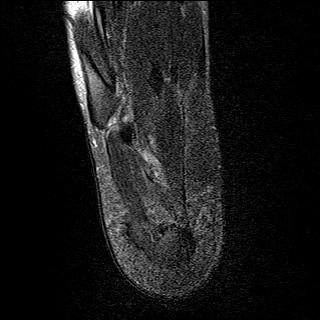
[im 12/32]
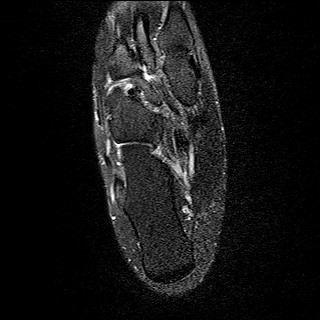
[im 16/32]
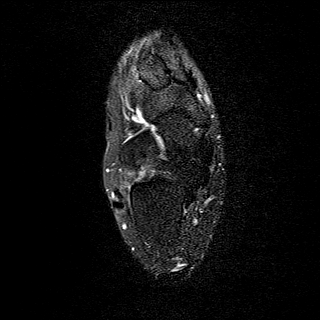
[im 20/32]
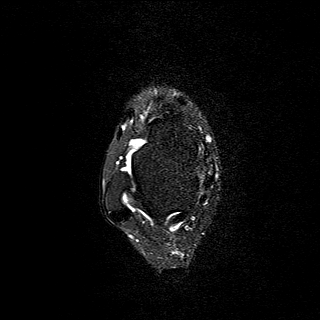
[im 24/32]
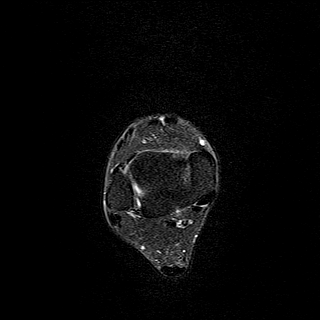
[im 28/32]
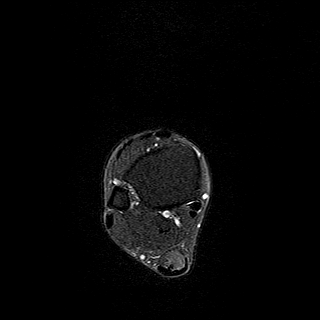
[im 32/32]
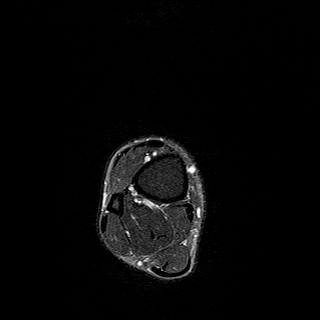

[Series 5: T1 · axial · 3.0mm · 0.50mm/px · z∈[-97,+23]mm · 9 of 32 slices shown (1 of 2)]
[im 1/32]
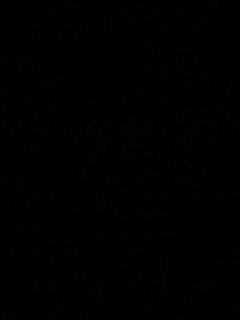
[im 4/32]
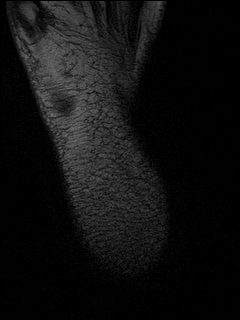
[im 8/32]
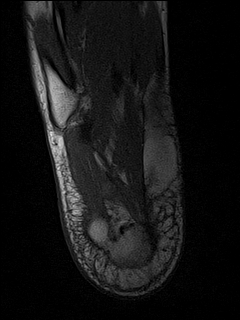
[im 12/32]
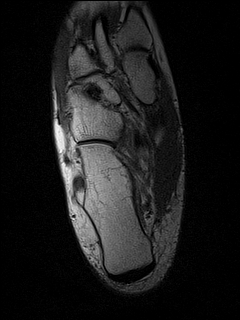
[im 16/32]
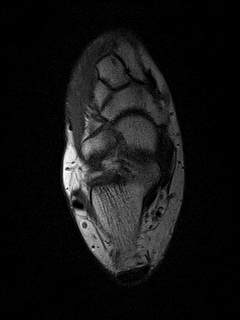
[im 20/32]
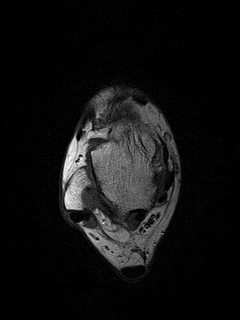
[im 24/32]
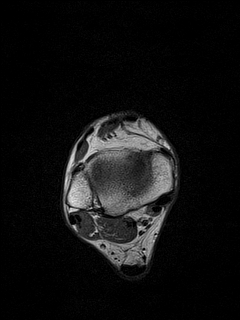
[im 28/32]
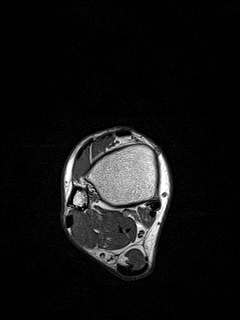
[im 32/32]
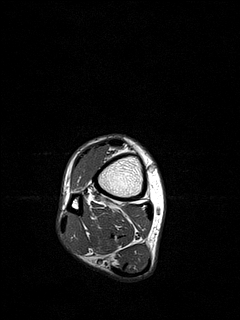

[Series 6: T1 · sagittal · 4.0mm · 0.56mm/px · 6 of 22 slices shown (2 of 2)]
[im 1/22]
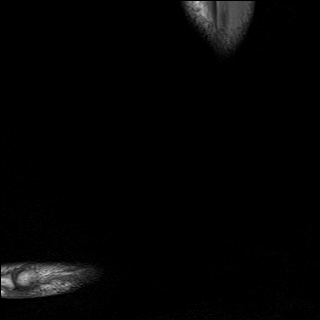
[im 5/22]
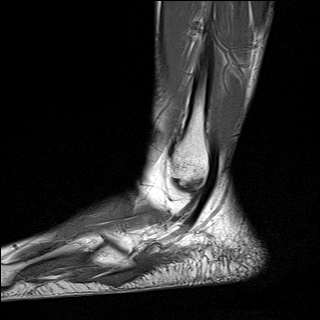
[im 9/22]
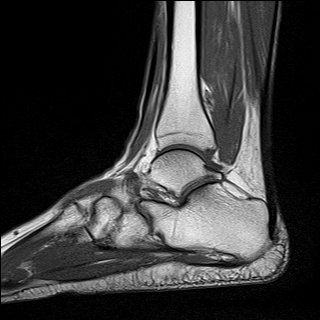
[im 13/22]
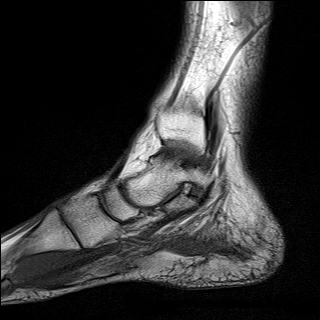
[im 17/22]
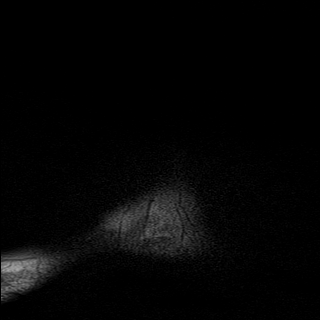
[im 22/22]
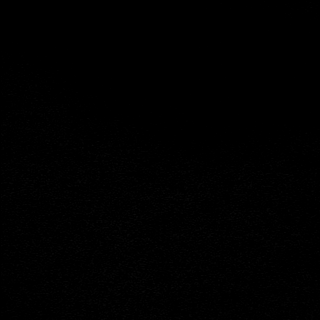

[Series 7: STIR · sagittal · 4.0mm · 0.35mm/px · 6 of 22 slices shown]
[im 1/22]
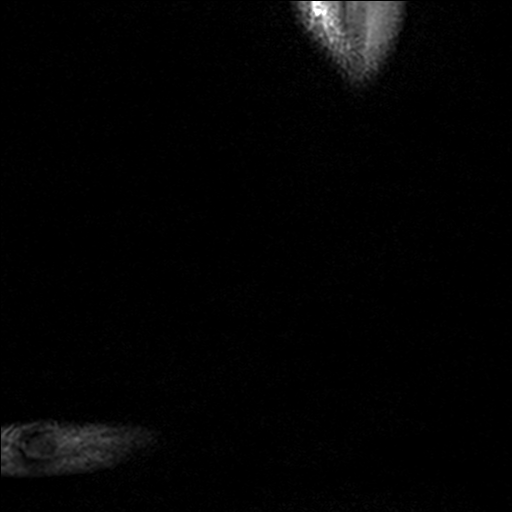
[im 5/22]
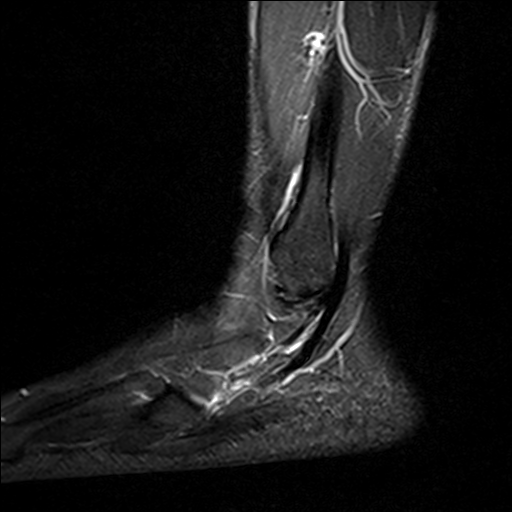
[im 9/22]
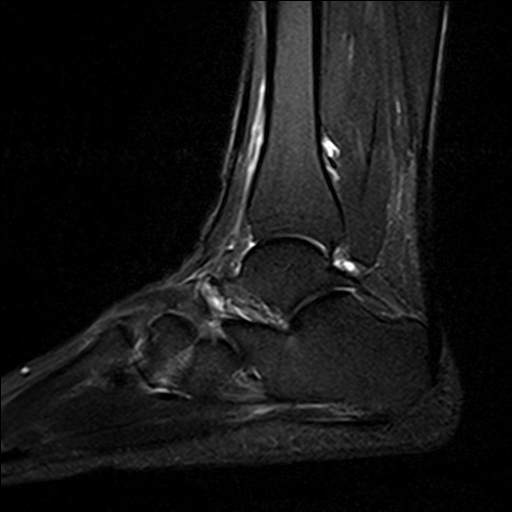
[im 13/22]
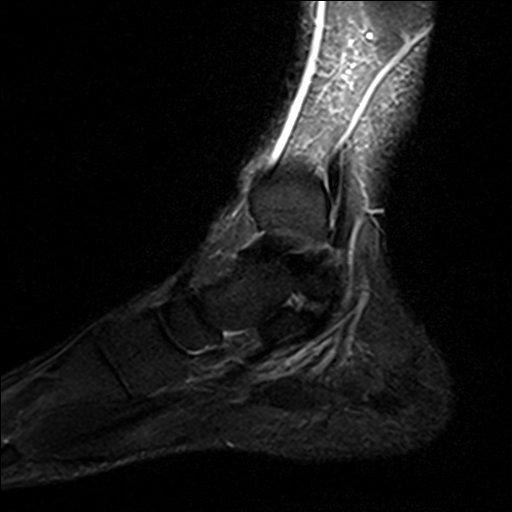
[im 17/22]
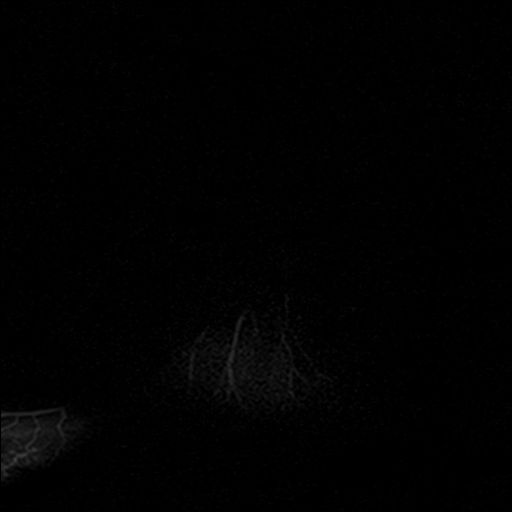
[im 22/22]
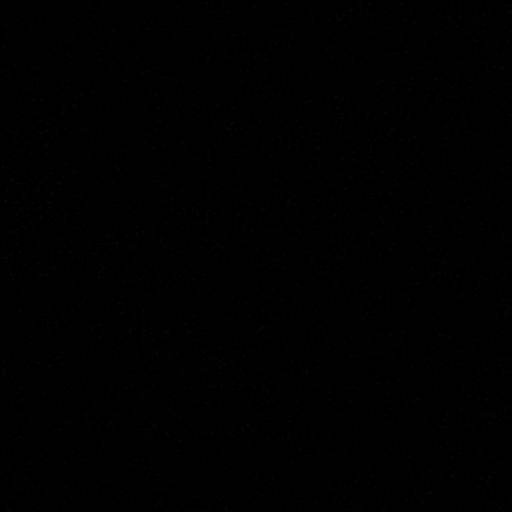

[Series 8: T2 fat-sat · coronal · 3.0mm · 0.50mm/px · 10 of 35 slices shown (2 of 2)]
[im 1/35]
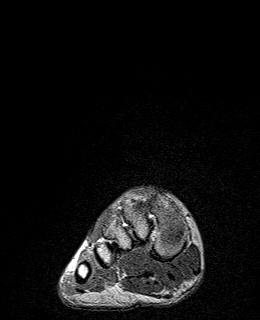
[im 4/35]
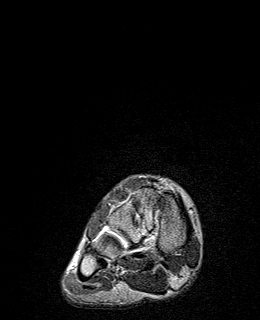
[im 8/35]
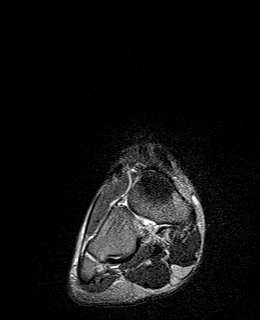
[im 12/35]
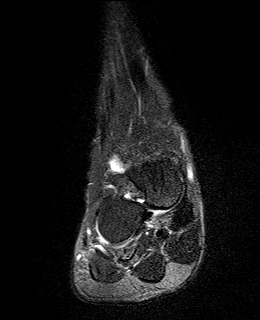
[im 16/35]
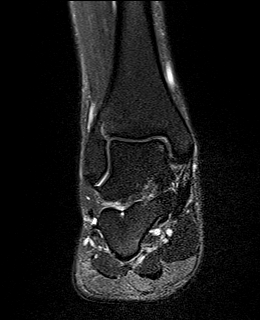
[im 19/35]
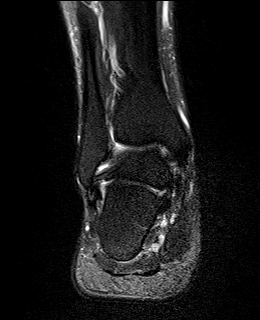
[im 23/35]
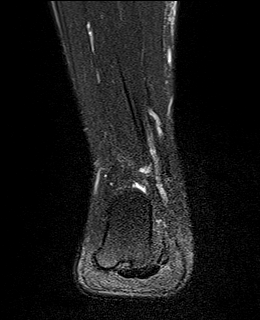
[im 27/35]
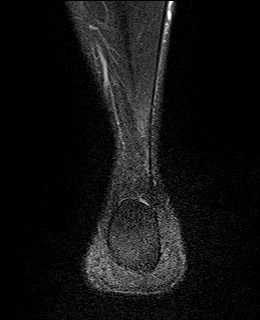
[im 31/35]
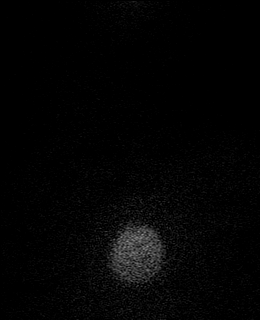
[im 35/35]
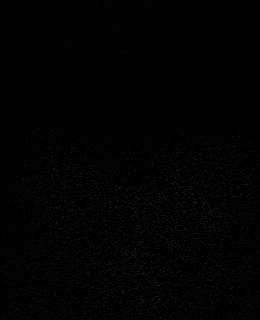

[40 of 40 positions shown; findings below may reference images not displayed]

FINDINGS: TENDONS

Peroneal: Mild tendinosis of the peroneus longus adjacent to the
peroneal tubercle. Mild tendinosis of the peroneus brevis.

Posteromedial: Posterior tibial tendon intact. Flexor hallucis
longus tendon intact. Flexor digitorum longus tendon intact.

Anterior: Tibialis anterior tendon intact. Extensor hallucis longus
tendon intact Extensor digitorum longus tendon intact.

Achilles: Intact. Mild enthesopathic changes of the Achilles tendon
insertion.

Plantar Fascia: Intact. Small plantar calcaneal spur.

LIGAMENTS

Lateral: Anterior talofibular ligament intact. Calcaneofibular
ligament intact. Posterior talofibular ligament intact. Anterior and
posterior tibiofibular ligaments intact.

Medial: Deltoid ligament intact. Spring ligament intact.

CARTILAGE

Ankle Joint: No joint effusion. Normal ankle mortise. No chondral
defect.

Subtalar Joints/Sinus Tarsi: Normal subtalar joints. No subtalar
joint effusion. Normal sinus tarsi.

Bones: No marrow signal abnormality.  No fracture or dislocation.

Soft Tissue: No fluid collection or hematoma. Muscles are normal
without edema or atrophy. Tarsal tunnel is normal.
IMPRESSION: 1. Mild tendinosis of the peroneus longus adjacent to the peroneal
tubercle.
2. Mild tendinosis of the peroneus brevis.

## 2021-12-21 ENCOUNTER — Telehealth: Payer: Self-pay | Admitting: *Deleted

## 2021-12-21 NOTE — Telephone Encounter (Signed)
Pt has new CPAP machine she recieved 2 weeks ago. She does have Initial Follow up with megan in march will keep that appointment.  Canceled appointment for tomorrow for CPAP follow up 12/22/2021

## 2021-12-22 ENCOUNTER — Other Ambulatory Visit: Payer: Self-pay

## 2021-12-22 ENCOUNTER — Ambulatory Visit: Payer: Managed Care, Other (non HMO) | Admitting: Podiatry

## 2021-12-22 ENCOUNTER — Telehealth: Payer: Managed Care, Other (non HMO) | Admitting: Adult Health

## 2021-12-22 ENCOUNTER — Encounter: Payer: Self-pay | Admitting: Podiatry

## 2021-12-22 DIAGNOSIS — M19079 Primary osteoarthritis, unspecified ankle and foot: Secondary | ICD-10-CM

## 2021-12-22 DIAGNOSIS — M722 Plantar fascial fibromatosis: Secondary | ICD-10-CM | POA: Diagnosis not present

## 2021-12-22 MED ORDER — DICLOFENAC SODIUM 75 MG PO TBEC: 75.0000 mg | DELAYED_RELEASE_TABLET | Freq: Two times a day (BID) | ORAL | 0 refills | Status: DC

## 2021-12-22 NOTE — Progress Notes (Signed)
°  Subjective:  Patient ID: Denise Barnett, female    DOB: 01/29/1971,  MRN: [id number]  Chief Complaint  Patient presents with   Follow-up   Plantar Fasciitis    Plantar fasciitis, bilateral   ARthritis of midfoot      51 y.o. female presents with the above complaint. History confirmed with patient. States the pain is the same. Had MRI performed and is here for review. Objective:  Physical Exam: warm, good capillary refill, no trophic changes or ulcerative lesions, normal DP and PT pulses and normal sensory exam. Xerosis without active psoriasis plaques bilat. Left Foot:  POP midfoot with prominent osteophytes, no POP medial calc tuber Right Foot: POP midfoot with prominent osteophytes  right 2nd toenail dystrophic increased height of the nails. No POP medial calc tuber  Study Result  Narrative & Impression  CLINICAL DATA:  Chronic right heel pain. Pain along the dorsal aspect. Pain for several years. No known injury.   EXAM: MR OF THE RIGHT HEEL WITHOUT CONTRAST   TECHNIQUE: Multiplanar, multisequence MR imaging of the right heel was performed. No intravenous contrast was administered.   COMPARISON:  None.   FINDINGS: TENDONS   Peroneal: Mild tendinosis of the peroneus longus adjacent to the peroneal tubercle. Mild tendinosis of the peroneus brevis.   Posteromedial: Posterior tibial tendon intact. Flexor hallucis longus tendon intact. Flexor digitorum longus tendon intact.   Anterior: Tibialis anterior tendon intact. Extensor hallucis longus tendon intact Extensor digitorum longus tendon intact.   Achilles: Intact. Mild enthesopathic changes of the Achilles tendon insertion.   Plantar Fascia: Intact. Small plantar calcaneal spur.   LIGAMENTS   Lateral: Anterior talofibular ligament intact. Calcaneofibular ligament intact. Posterior talofibular ligament intact. Anterior and posterior tibiofibular ligaments intact.   Medial: Deltoid ligament intact. Spring  ligament intact.   CARTILAGE   Ankle Joint: No joint effusion. Normal ankle mortise. No chondral defect.   Subtalar Joints/Sinus Tarsi: Normal subtalar joints. No subtalar joint effusion. Normal sinus tarsi.   Bones: No marrow signal abnormality.  No fracture or dislocation.   Soft Tissue: No fluid collection or hematoma. Muscles are normal without edema or atrophy. Tarsal tunnel is normal.   IMPRESSION: 1. Mild tendinosis of the peroneus longus adjacent to the peroneal tubercle. 2. Mild tendinosis of the peroneus brevis.     Electronically Signed   By: Kathreen Devoid M.D.   On: 12/20/2021 11:43     Assessment:   1. Plantar fasciitis, bilateral   2. Arthritis of midfoot     Plan:  Patient was evaluated and treated and all questions answered.  Arthritis, Plantar fasciitis -Given MRI findings I think that continued conservative care could alleviate symptoms, but we did discuss if the pain is affecting her QOL we could consider surgery. Patient ok with holding off surgery for now -Rx diclofenac for anti-inflammatory purposes -Recc CMOs. Offered to cast for them today patient would like to call insurance for benefits. Patient can call for appt to get casted should she prefer.  Return in about 6 weeks (around 02/02/2022).

## 2021-12-23 NOTE — Telephone Encounter (Signed)
Please advise 

## 2022-01-18 ENCOUNTER — Other Ambulatory Visit: Payer: Self-pay | Admitting: Podiatry

## 2022-01-18 NOTE — Telephone Encounter (Signed)
Please advise 

## 2022-02-11 ENCOUNTER — Ambulatory Visit (INDEPENDENT_AMBULATORY_CARE_PROVIDER_SITE_OTHER): Payer: Managed Care, Other (non HMO)

## 2022-02-11 ENCOUNTER — Ambulatory Visit: Payer: Managed Care, Other (non HMO) | Admitting: Podiatry

## 2022-02-11 ENCOUNTER — Other Ambulatory Visit: Payer: Self-pay

## 2022-02-11 DIAGNOSIS — M19079 Primary osteoarthritis, unspecified ankle and foot: Secondary | ICD-10-CM

## 2022-02-11 DIAGNOSIS — M19072 Primary osteoarthritis, left ankle and foot: Secondary | ICD-10-CM | POA: Diagnosis not present

## 2022-02-11 DIAGNOSIS — M722 Plantar fascial fibromatosis: Secondary | ICD-10-CM | POA: Diagnosis not present

## 2022-02-11 DIAGNOSIS — M778 Other enthesopathies, not elsewhere classified: Secondary | ICD-10-CM

## 2022-02-11 MED ORDER — TRIAMCINOLONE ACETONIDE 10 MG/ML IJ SUSP
10.0000 mg | Freq: Once | INTRAMUSCULAR | Status: AC
  Administered 2022-02-11: 10 mg

## 2022-02-11 MED ORDER — MELOXICAM 15 MG PO TABS: 15.0000 mg | ORAL_TABLET | Freq: Every day | ORAL | 0 refills | Status: DC | PRN

## 2022-02-11 NOTE — Patient Instructions (Signed)

## 2022-02-14 ENCOUNTER — Other Ambulatory Visit: Payer: Self-pay | Admitting: Podiatry

## 2022-02-15 NOTE — Progress Notes (Signed)
Subjective: ?51 year old female presents the office today with concerns of left foot pain.  She said the pain just started today and it was on the medial aspect the foot which is majority discomfort.  She denies the pain is shooting and soreness.  No redness or warmth associated with this.  No injury that she reports.  No significant swelling.  No recent treatment.  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  No other concerns. ? ?Objective: ?AAO x3, NAD ?DP/PT pulses palpable bilaterally, CRT less than 3 seconds ?Majority discomfort on the left foot bone to the left first MPJ.  There is some trace edema but there is no erythema or warmth.  No pain with MPJ range of motion.  No specific area pinpoint tenderness noted otherwise.  MMT 5/5. ?No pain with calf compression, swelling, warmth, erythema ? ?Assessment: ?Capsulitis left foot ? ?Plan: ?-All treatment options discussed with the patient including all alternatives, risks, complications.  ?-X-rays obtained reviewed.  No evidence of acute fracture.  Elongated first metatarsal. ?-Prescribed mobic. Discussed side effects of the medication and directed to stop if any are to occur and call the office.  ?-Steroid injection performed.  Skin was prepped with Betadine, alcohol and mixture of 1 cc Kenalog 10, 0.5 cc of Marcaine plain, 0.5 cc of lidocaine plain was infiltrated on the area of maximal tenderness without complications.  Postinjection care discussed.  Tolerated well. ?-Discussed shoe modifications and good arch support. ?-Patient encouraged to call the office with any questions, concerns, change in symptoms.  ? ?Trula Slade DPM ? ? ?

## 2022-03-04 ENCOUNTER — Encounter: Payer: Self-pay | Admitting: Adult Health

## 2022-03-04 ENCOUNTER — Ambulatory Visit: Payer: Managed Care, Other (non HMO) | Admitting: Adult Health

## 2022-03-04 VITALS — BP 131/78 | HR 94 | Ht 63.0 in | Wt 208.0 lb

## 2022-03-04 DIAGNOSIS — Z9989 Dependence on other enabling machines and devices: Secondary | ICD-10-CM | POA: Diagnosis not present

## 2022-03-04 DIAGNOSIS — G4733 Obstructive sleep apnea (adult) (pediatric): Secondary | ICD-10-CM

## 2022-03-04 NOTE — Progress Notes (Signed)
? ? ?PATIENT: Denise Barnett ?DOB: 07-03-1971 ? ?REASON FOR VISIT: follow up ?HISTORY FROM: patient ? ?HISTORY OF PRESENT ILLNESS: ?Today 03/04/22: ? ?Denise Barnett is a 51 year old female with a history of obstructive sleep apnea on CPAP.  She returns today for follow-up.  Her download indicates that she used her machine 28 out of 30 days For compliance of 93%.  Uses machine greater than 4 hours each night.  Residual AHI is 0.5 on 6 to 12 cm of water.  She reports that she does not like this machine as much as the old one.  She was not given a ResMed machine.  She returns today for an evaluation. ? ?12/18/20: Denise Barnett is a 51 year old female with a history of obstructive sleep apnea on CPAP.  Her download indicates that she use her machine nightly for compliance of 100%.  She is a machine greater than 4 hours each night.  On average she uses her machine 8 hours and 4 minutes.  Her residual AHI is 0.7 on 8 cm of water with EPR 3.  Leak in the 95th percentile is 7.4 L/min.  She reports that the CPAP works well for her however her husband still hears her snore and sometimes she puffs her cheeks when sleeping.  She currently uses the nasal pillows.  She returns today for an evaluation. ? ?HISTORY (copied from Dr. Guadelupe Sabin note) ?08/30/2019: I reviewed her CPAP compliance data from 07/31/2019 through 08/29/2019 which is a total of 30 days, during which time she used her machine every night with percent use days greater than 4 hours at 100%, indicating superb compliance with an average usage of 8 hours and 4 minutes, residual AHI at goal at 0.7/h, leak on the low side with a 95th percentile at 7.4 L/min on a pressure of 8 cm with EPR of 3. She reports doing well with her CPAP, she does need new supplies.  She uses a nasal mask, sometimes she notices air leaking from her mouth but otherwise it is going well.  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  She started Lexapro.  She has also noticed some 3 to 4 months ago a swelling in the right parotid gland area and neck area.  She had an ultrasound, she had a CT, she had consultation with ENT and also a fine-needle aspiration, no evidence of a cancerous lesion but no full resolution as to the swelling either.  She is discussing next steps with her primary care physician.  She has had left foot pain and bone spur issues, had an injection with her podiatrist into her left heel and is wearing a boot.  She has started meloxicam 7.5 mg daily about a week ago.  ?  ? ?REVIEW OF SYSTEMS: Out of a complete 14 system review of symptoms, the patient complains only of the following symptoms, and all other reviewed systems are negative. ? ?ESS 10 ? ?ALLERGIES: ?Allergies  ?Allergen Reactions  ? Bactrim [Sulfamethoxazole-Trimethoprim] Rash  ? Penicillins Rash  ?  Has patient had a PCN reaction causing immediate rash, facial/tongue/throat swelling, SOB or lightheadedness with hypotension: No ?Has patient had a PCN reaction causing severe rash involving mucus membranes or skin necrosis: Unknown ?Has patient had a PCN reaction that required hospitalization: No ?Has patient had a PCN reaction occurring within the last 10 years: No ?If all of the  above answers are "NO", then may proceed with Cephalosporin use. ?  ? ? ?HOME MEDICATIONS: ?Outpatient Medications Prior to Visit  ?Medication Sig Dispense Refill  ? Apremilast 30 MG TABS Take 30 mg by mouth 2 (two) times daily.     ? atorvastatin (LIPITOR) 20 MG tablet Take 20 mg by mouth daily.    ? clobetasol cream (TEMOVATE) 2.58 % Apply 1 application topically daily as needed (psoriasis).     ? Continuous Blood Gluc Sensor (FREESTYLE LIBRE 14 DAY SENSOR) MISC Apply topically.    ? cyclobenzaprine (FLEXERIL) 10 MG tablet Take 5-10 mg by mouth daily as needed for muscle spasms.     ? diclofenac (VOLTAREN) 75 MG EC tablet TAKE 1 TABLET (75 MG TOTAL) BY  MOUTH 2 (TWO) TIMES DAILY WITH A MEAL. 60 tablet 0  ? escitalopram (LEXAPRO) 20 MG tablet Take 20 mg by mouth daily.    ? ferrous sulfate 325 (65 FE) MG tablet     ? fluticasone (CUTIVATE) 0.05 % cream fluticasone propionate 0.05 % topical cream    ? gabapentin (NEURONTIN) 100 MG capsule Take 100 mg by mouth 3 (three) times daily.    ? gabapentin (NEURONTIN) 300 MG capsule Take 300 mg by mouth daily.    ? gabapentin (NEURONTIN) 300 MG capsule     ? glucose blood (ONETOUCH VERIO) test strip     ? hydrOXYzine (VISTARIL) 25 MG capsule Take 25-50 mg by mouth at bedtime.    ? icosapent Ethyl (VASCEPA) 1 g capsule Take 2 g by mouth 2 (two) times daily.    ? JARDIANCE 25 MG TABS tablet Take 25 mg by mouth daily.    ? meloxicam (MOBIC) 15 MG tablet Take 1 tablet (15 mg total) by mouth daily as needed for pain. 30 tablet 0  ? metFORMIN (GLUCOPHAGE) 1000 MG tablet Take 1,000 mg by mouth 2 (two) times daily with a meal.   0  ? Norgestimate-Ethinyl Estradiol Triphasic 0.18/0.215/0.25 MG-35 MCG tablet Take 1 tablet by mouth at bedtime.     ? OneTouch Delica Lancets 52D MISC     ? PAXLOVID, 300/100, 20 x 150 MG & 10 x '100MG'$  TBPK See admin instructions.    ? polyethylene glycol-electrolytes (NULYTELY) 420 g solution peg-electrolyte solution 420 gram oral solution    ? valACYclovir (VALTREX) 500 MG tablet Take 500 mg by mouth 2 (two) times daily as needed (cold sores).   2  ? ?No facility-administered medications prior to visit.  ? ? ?PAST MEDICAL HISTORY: ?Past Medical History:  ?Diagnosis Date  ? Anxiety   ? Bronchitis   ? Diabetes mellitus without complication ([place])   ? Type II  ? Heel spur, left   ? Incisional hernia   ? Lichen planus   ? OSA (obstructive sleep apnea) 12/25/2013  ? cpap- does not know settings   ? Pneumonia   ? hx of   ? ? ?PAST SURGICAL HISTORY: ?Past Surgical History:  ?Procedure Laterality Date  ? BILATERAL SALPINGECTOMY Left 09/15/2017  ? Procedure: LEFT SALPINGECTOMY;  Surgeon: Everitt Amber, MD;  Location: [facility];  Service: Gynecology;  Laterality: Left;  ? FOREIGN BODY REMOVAL ABDOMINAL N/A 11/07/2018  ? Procedure: REMOVAL FOREIGN BODY ABDOMINAL;  Surgeon: Ralene Ok, MD;  Location: [place];  Service: General;  Laterality: N/A;  ? [place] N/A 05/29/2018  ? Procedure: LAPAROSCOPIC INCISIONAL HERNIA;  Surgeon: Ralene Ok, MD;  Location: [place];  Service: General;  Laterality: N/A;  ? INSERTION OF  MESH N/A 05/29/2018  ? Procedure: INSERTION OF MESH;  Surgeon: Ralene Ok, MD;  Location: [place];  Service: General;  Laterality: N/A;  ? LAPAROSCOPY N/A 11/07/2018  ? Procedure: LAPAROSCOPY DIAGNOSTIC;  Surgeon: Ralene Ok, MD;  Location: [place];  Service: General;  Laterality: N/A;  ? LAPAROTOMY Bilateral 09/15/2017  ? Procedure: EXPLORATORY LAPAROTOMY;  Surgeon: Everitt Amber, MD;  Location: [facility];  Service: Gynecology;  Laterality: Bilateral;  ? none    ? OOPHORECTOMY Left 09/15/2017  ? Procedure: LEFT OOPHORECTOMY;  Surgeon: Everitt Amber, MD;  Location: [facility];  Service: Gynecology;  Laterality: Left;  ? PAROTIDECTOMY Right 01/02/2020  ? Procedure: RIGHT PAROTIDECTOMY;  Surgeon: Leta Baptist, MD;  Location: [place];  Service: ENT;  Laterality: Right;  ? VIDEO BRONCHOSCOPY Bilateral 04/18/2017  ? Procedure: VIDEO BRONCHOSCOPY WITHOUT FLUORO;  Surgeon: Rush Farmer, MD;  Location: [facility];  Service: Endoscopy;  Laterality: Bilateral;  ? ? ?FAMILY HISTORY: ?Family History  ?Problem Relation Age of Onset  ? Sleep apnea Mother   ? Heart attack Brother   ? ? ?SOCIAL HISTORY: ?Social History  ? ?Socioeconomic History  ? Marital status: Married  ?  Spouse name: Not on file  ? Number of children: 1  ? Years of education: 36  ? Highest education level: Not on file  ?Occupational History  ? 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  Employer: [employer]  ?Tobacco Use  ? Smoking status: Former  ?  Packs/day: 0.75  ?  Years: 20.00  ?  Pack years: 15.00  ?  Types: Cigarettes  ?  Quit date: 2010  ?  Years since  quitting: 13.2  ? Smokeless tobacco: Never  ?Vaping Use  ? Vaping Use: Never used  ?Substance and Sexual Activity  ? Alcohol use: No  ? Drug use: No  ? Sexual activity: Yes  ?Other Topics Concern  ? Not o

## 2022-03-07 ENCOUNTER — Encounter: Payer: Self-pay | Admitting: Podiatry

## 2022-03-08 NOTE — Telephone Encounter (Signed)
Can someone please call her to get scheduled? Thanks! ?

## 2022-03-08 NOTE — Telephone Encounter (Signed)
Patient scheduled for 03/09/22 245pm.

## 2022-03-09 ENCOUNTER — Ambulatory Visit (INDEPENDENT_AMBULATORY_CARE_PROVIDER_SITE_OTHER): Payer: Managed Care, Other (non HMO)

## 2022-03-09 ENCOUNTER — Other Ambulatory Visit: Payer: Self-pay

## 2022-03-09 ENCOUNTER — Ambulatory Visit: Payer: Managed Care, Other (non HMO) | Admitting: Podiatry

## 2022-03-09 DIAGNOSIS — M778 Other enthesopathies, not elsewhere classified: Secondary | ICD-10-CM | POA: Diagnosis not present

## 2022-03-09 DIAGNOSIS — M1990 Unspecified osteoarthritis, unspecified site: Secondary | ICD-10-CM

## 2022-03-09 DIAGNOSIS — R52 Pain, unspecified: Secondary | ICD-10-CM | POA: Diagnosis not present

## 2022-03-09 MED ORDER — TRIAMCINOLONE ACETONIDE 10 MG/ML IJ SUSP
10.0000 mg | Freq: Once | INTRAMUSCULAR | Status: AC
  Administered 2022-03-09: 10 mg

## 2022-03-11 ENCOUNTER — Other Ambulatory Visit: Payer: Self-pay | Admitting: Podiatry

## 2022-03-11 NOTE — Telephone Encounter (Signed)
Please advise 

## 2022-03-12 LAB — C-REACTIVE PROTEIN: CRP: 28.4 mg/L — ABNORMAL HIGH (ref <8.0)

## 2022-03-12 LAB — URIC ACID: Uric Acid, Serum: 3.6 mg/dL (ref 2.5–7.0)

## 2022-03-12 LAB — SEDIMENTATION RATE: Sed Rate: 6 mm/h (ref 0–20)

## 2022-03-12 LAB — ANA: Anti Nuclear Antibody (ANA): POSITIVE — AB

## 2022-03-12 LAB — ANTI-NUCLEAR AB-TITER (ANA TITER): ANA Titer 1: 1:80 {titer} — ABNORMAL HIGH

## 2022-03-12 LAB — RHEUMATOID FACTOR: Rheumatoid fact SerPl-aCnc: <14 IU/mL (ref <14)

## 2022-03-14 NOTE — Progress Notes (Signed)
Subjective: ?51 year old female presents the office today with concerns of left foot pain.  She has a new complaint of pain she points on the lateral aspect the left foot where she gets discomfort started 4 days ago.  She denies any recent injury.  No history of gout.  No recent treatment for this issue.  No other concerns today.  ? ?Objective: ?AAO x3, NAD ?DP/PT pulses palpable bilaterally, CRT less than 3 seconds ?The pain that she was experiencing to the foot first MPJ has resolved.  The majority tenderness is on the fifth MPJ today and there is localized edema and faint erythema present.  Ammie with MPJ range of motion.  No other areas of discomfort noted today. ?MMT 5/5. ?No pain with calf compression, swelling, warmth, erythema ? ?Assessment: ?Capsulitis left foot ? ?Plan: ?-All treatment options discussed with the patient including all alternatives, risks, complications.  ?-X-rays obtained reviewed.  3 views of the left foot were obtained.  No evidence of acute fracture.  Elongated first metatarsal. ?-Steroid injection performed to the left fifth MPJ.  Skin was prepped with Betadine, alcohol and mixture of 1 cc Kenalog 10, 0.5 cc of Marcaine plain, 0.5 cc of lidocaine plain was infiltrated on the area of maximal tenderness without complications and to and around the fifth MPJ.  Postinjection care discussed.  Tolerated well. ?-Meloxicam as needed ?-Discussed shoe modifications and good arch support. ?-Ongoing history of foot pain up with her blood work eluding uric acid, sed rate, CRP, rheumatoid factor, anti-CCP, ANA ?-Patient encouraged to call the office with any questions, concerns, change in symptoms.  ? ?Trula Slade DPM ? ? ?

## 2022-03-15 ENCOUNTER — Encounter: Payer: Self-pay | Admitting: Podiatry

## 2022-03-15 ENCOUNTER — Telehealth: Payer: Self-pay | Admitting: *Deleted

## 2022-03-15 ENCOUNTER — Other Ambulatory Visit: Payer: Self-pay | Admitting: Podiatry

## 2022-03-15 DIAGNOSIS — M778 Other enthesopathies, not elsewhere classified: Secondary | ICD-10-CM

## 2022-03-15 DIAGNOSIS — M1990 Unspecified osteoarthritis, unspecified site: Secondary | ICD-10-CM

## 2022-03-15 NOTE — Telephone Encounter (Signed)
Patient is calling for the test results of labs completed on 03/09/22. Please advise. ?

## 2022-03-17 NOTE — Telephone Encounter (Signed)
Faxed referral to [facility], received confirmation 03/17/22

## 2022-03-22 ENCOUNTER — Encounter: Payer: Self-pay | Admitting: Podiatry

## 2022-03-22 ENCOUNTER — Other Ambulatory Visit: Payer: Self-pay | Admitting: Podiatry

## 2022-03-22 DIAGNOSIS — M778 Other enthesopathies, not elsewhere classified: Secondary | ICD-10-CM

## 2022-03-27 ENCOUNTER — Ambulatory Visit
Admission: RE | Admit: 2022-03-27 | Discharge: 2022-03-27 | Disposition: A | Discharge status: Home / Self Care | Payer: Managed Care, Other (non HMO) | Source: Ambulatory Visit | Attending: Podiatry | Admitting: Podiatry

## 2022-03-27 DIAGNOSIS — M778 Other enthesopathies, not elsewhere classified: Secondary | ICD-10-CM

## 2022-03-27 IMAGING — MR MR FOOT*L* W/O CM
4 of 5 series · 22 of 40 positions shown · non-contrast
Comparison: Left foot radiograph [DATE]

CLINICAL DATA: Foot pain, chronic, metatarsalgia

EXAM:
MRI OF THE LEFT FOOT WITHOUT CONTRAST
TECHNIQUE: Multiplanar, multisequence MR imaging of the left forefoot was
performed. No intravenous contrast was administered.

[Series 4: T1 · coronal · 3.0mm · 0.19mm/px · 5 of 44 slices shown (1 of 2)]
[im 1/44]
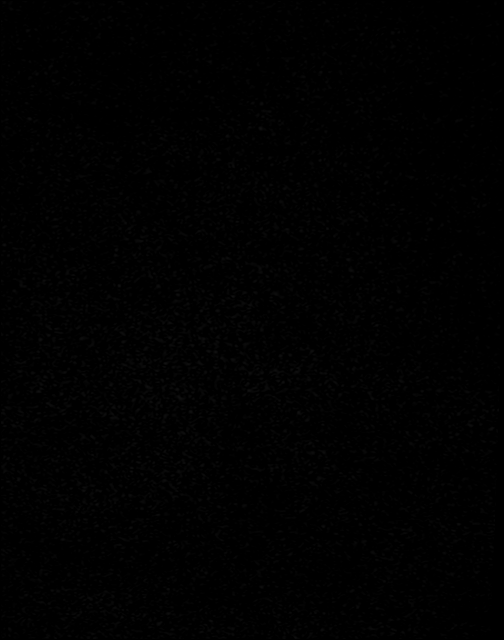
[im 8/44]
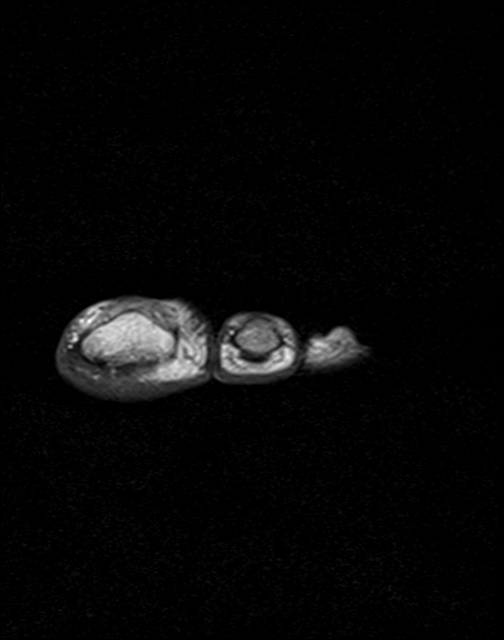
[im 12/44]
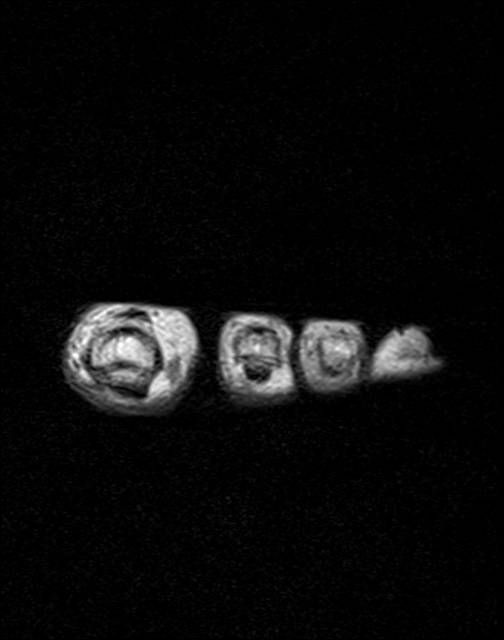
[im 24/44]
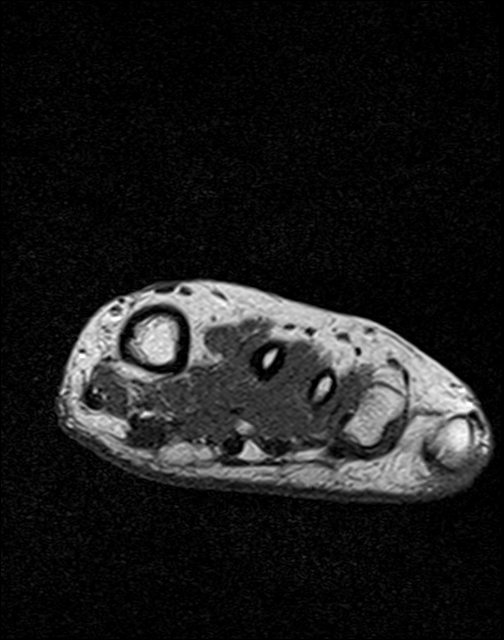
[im 40/44]
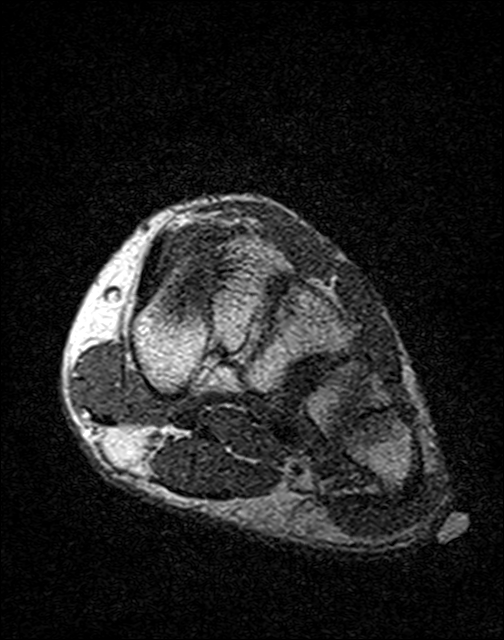

[Series 5: T2 fat-sat · axial · 3.0mm · 0.35mm/px · z∈[-170,-90]mm · 5 of 22 slices shown (1 of 2)]
[im 1/22]
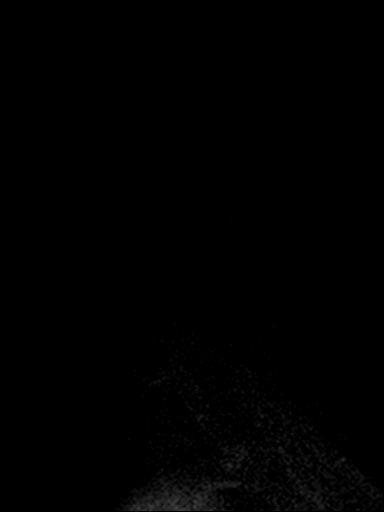
[im 6/22]
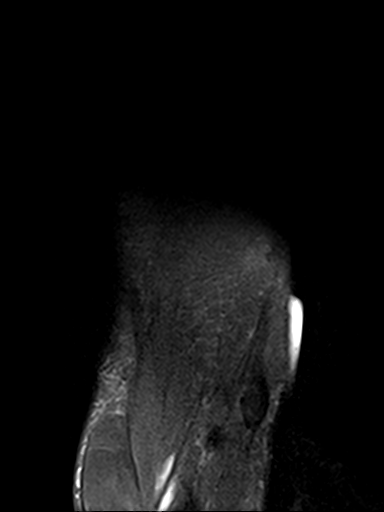
[im 11/22]
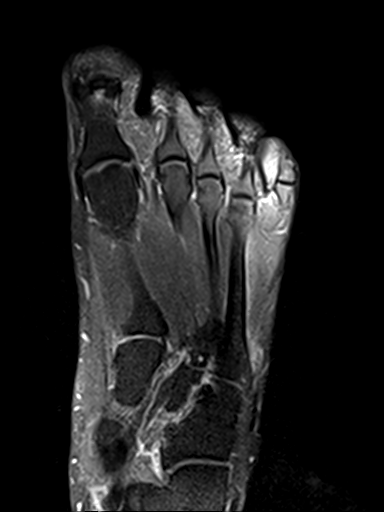
[im 16/22]
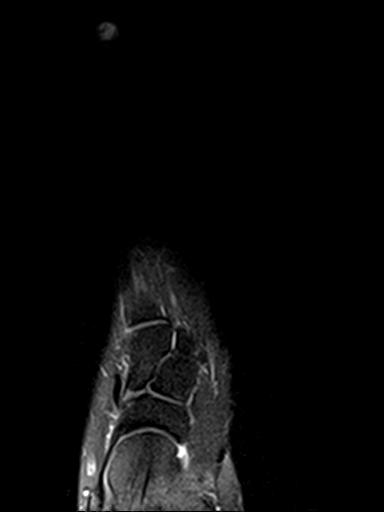
[im 22/22]
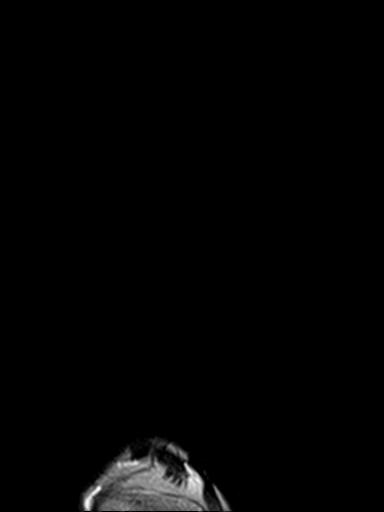

[Series 6: T2 fat-sat · coronal · 3.0mm · 0.23mm/px · 9 of 47 slices shown (2 of 2)]
[im 1/47]
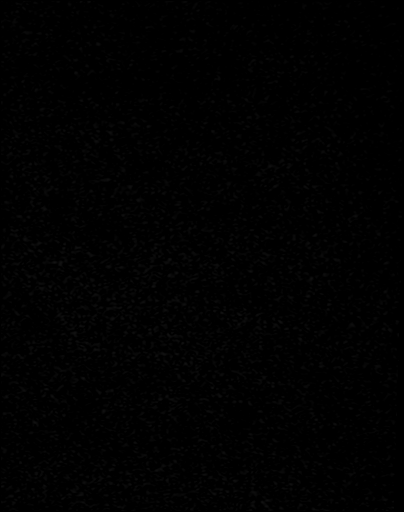
[im 9/47]
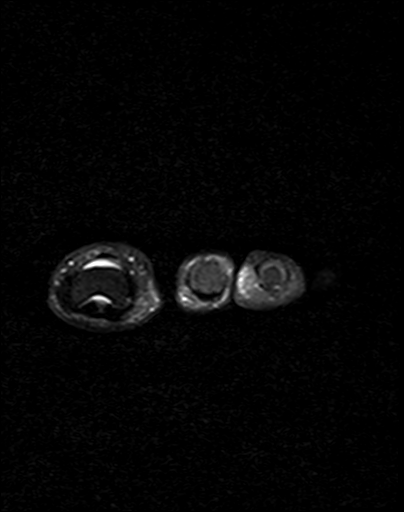
[im 13/47]
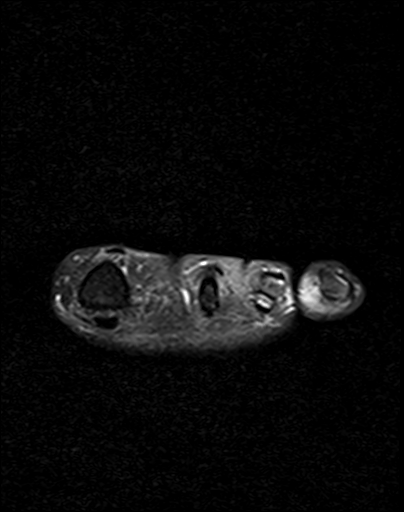
[im 21/47]
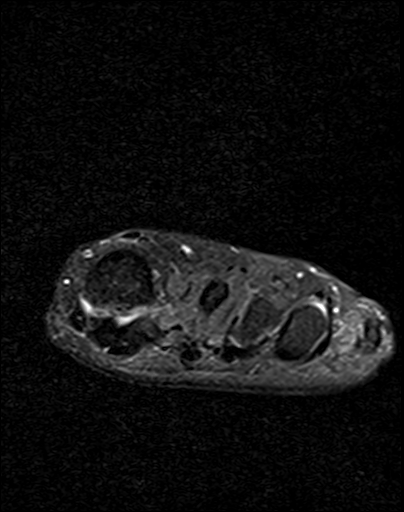
[im 26/47]
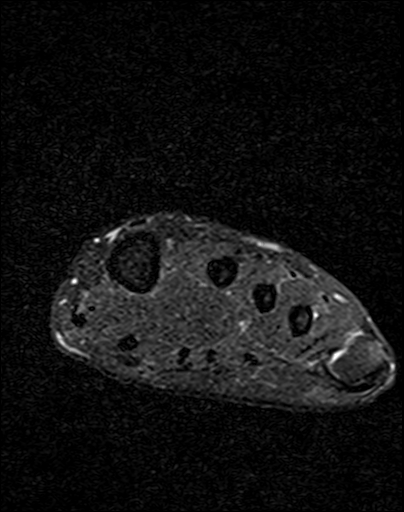
[im 34/47]
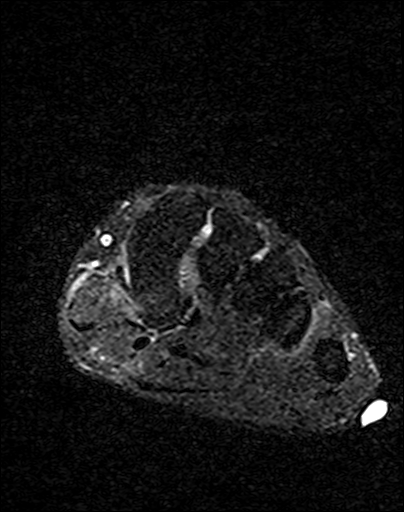
[im 38/47]
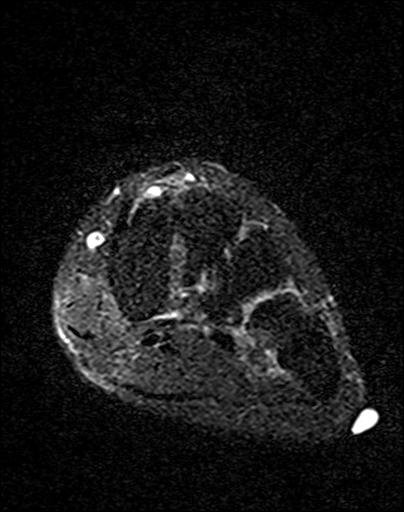
[im 42/47]
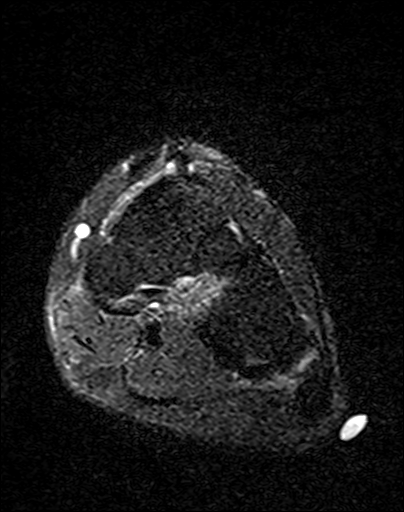
[im 47/47]
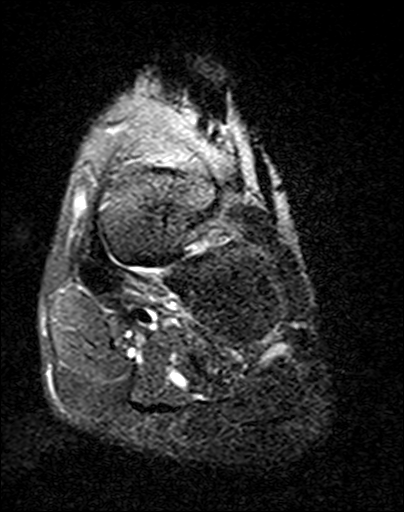

[Series 7: T1 · axial · 3.0mm · 0.35mm/px · z∈[-170,-90]mm · 3 of 22 slices shown (2 of 2)]
[im 1/22]
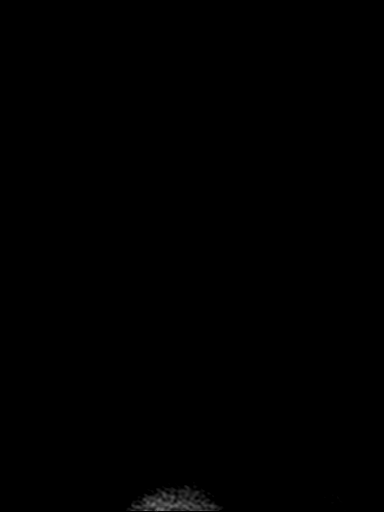
[im 11/22]
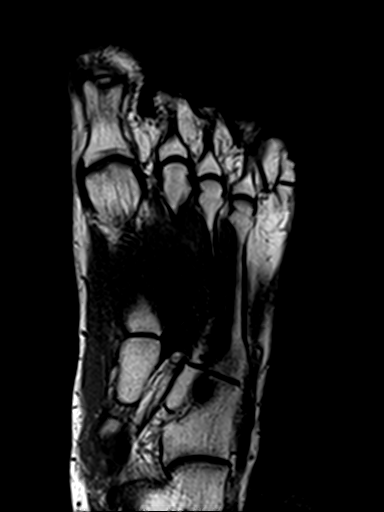
[im 22/22]
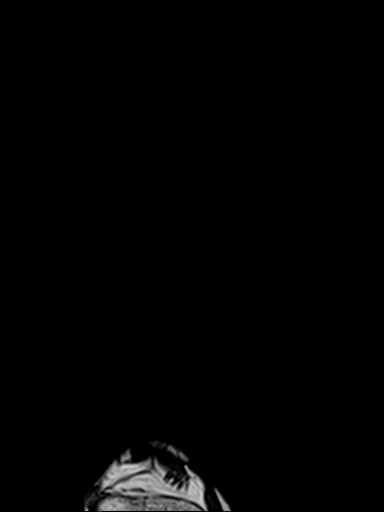

[22 of 40 positions shown; findings below may reference images not displayed]

FINDINGS: Bones/Joint/Cartilage

The cortex is intact. There is no significant marrow signal
alteration.

Ligaments

Intact Lisfranc ligament. Intact MTP collateral ligaments. No
evidence of plantar plate tear.

Muscles and Tendons

No acute tendon tear in the forefoot.

Soft tissues

There is focal intermetatarsal soft tissue edema at the base of the
fourth and fifth metatarsals (axial T2 images 12 and 13. The lateral
plantar fascia is unremarkable. There is no evidence of cystic or
solid mass. There is no evidence of intermetatarsal neuroma or
bursitis.
IMPRESSION: Focal intermetatarsal soft tissue edema at the base of the fourth
and fifth metatarsals. This may correlate with the patient's pain.
No evidence of acute fracture, tendon tear, or ligamentous injury.

## 2022-03-30 ENCOUNTER — Encounter: Payer: Self-pay | Admitting: Podiatry

## 2022-03-31 NOTE — Telephone Encounter (Signed)
Please schedule for 4pm tomorrow (Thursday) ?

## 2022-04-01 ENCOUNTER — Ambulatory Visit (INDEPENDENT_AMBULATORY_CARE_PROVIDER_SITE_OTHER): Payer: Managed Care, Other (non HMO) | Admitting: Podiatry

## 2022-04-01 DIAGNOSIS — M1990 Unspecified osteoarthritis, unspecified site: Secondary | ICD-10-CM

## 2022-04-01 DIAGNOSIS — M7752 Other enthesopathy of left foot: Secondary | ICD-10-CM | POA: Diagnosis not present

## 2022-04-01 DIAGNOSIS — M7750 Other enthesopathy of unspecified foot: Secondary | ICD-10-CM

## 2022-04-01 DIAGNOSIS — R768 Other specified abnormal immunological findings in serum: Secondary | ICD-10-CM

## 2022-04-04 NOTE — Progress Notes (Signed)
Subjective: ?51 year old female presents the office today for concerns of continued pain pointing to the lateral aspect the foot.  She had MRI performed which I discussed with her over MyChart the results.  No recent injury or change otherwise since I last saw her. ? ?Objective: ?AAO x3, NAD ?DP/PT pulses palpable bilaterally, CRT less than 3 seconds ?Majority tenderness stays on the fifth metatarsal base on the left foot.  There is no 70 degree to this area.  There is still some mild tenderness palpation of the fifth metatarsal head but this appears to be improved today there is still some minimal edema.  There is no area of pinpoint tenderness.  MMT 5/5. ?No pain with calf compression, swelling, warmth, erythema ? ?Assessment: ?Left foot tendinitis ? ?Plan: ?-All treatment options discussed with the patient including all alternatives, risks, complications.  ?-Again reviewed the MRI. ?-I discussed steroid injection fifth metatarsal base.  Discussed risks of this and she wants to proceed.  I cleaned the skin with alcohol and a mixture of 0.5 cc of dexamethasone phosphate, 0.5 cc of Marcaine plain was infiltrated the fifth metatarsal base distal to the area of the peroneal tendon and care was taken not to infiltrate into the peroneal tendon.  She tolerated well without any complications.  Postinjection care discussed.  Discussed limiting activity, ice, elevate. ?-Referral has been placed to rheumatology.  She like to have this switched to Dr. Pam Drown, MD at [facility].  ?-Patient encouraged to call the office with any questions, concerns, change in symptoms.  ? ?Trula Slade DPM ? ?

## 2022-04-05 ENCOUNTER — Encounter: Payer: Self-pay | Admitting: Adult Health

## 2022-04-08 ENCOUNTER — Telehealth: Payer: Self-pay | Admitting: *Deleted

## 2022-04-08 NOTE — Telephone Encounter (Signed)
-----   Message -----  ?From: Brandon Melnick, RN  ?Sent: 04/06/2022  11:42 AM EDT  ?To: Ocie Bob  ?Subject: new machine (switch out)                      ? ?Have we heard anything?  Newman Pies  ?----- Message -----  ?From: Marchelle Gearing  ?Sent: 04/05/2022   4:07 PM EDT  ?To: Vanessa Ralphs, Brandon Melnick, RN  ? ?I have reached out to see if they can switch it out.  ? ?----- Message -----  ?From: Brandon Melnick, RN  ?Sent: 04/05/2022   2:22 PM EDT  ?To: Ocie Bob  ? ?Hi Caryl Pina,  this is from Denise Barnett's Note:  for pt  ? ?Denise Barnett  ?Female, 51 y.o., [birth year]  ?Pronouns:  ?she/her/hers  ?MRN:  [id number]  ? ? ?1. OSA on CPAP  ?   ?- CPAP compliance excellent  ?- Good treatment of AHI  ?- Encourage patient to use CPAP nightly and > 4 hours each night  ?- RN spoke to Gerster at [facility] and they will be replacing her machine with a ResMed  ?- F/U in 1 year or sooner if needed  ?   ?   ?Ward Givens, MSN, NP-C 03/04/2022, 11:04 AM  ? ?Can you check up on this for me?  Replacing her machine with RESMED?    ? ?Acupuncturist   ? ?

## 2022-04-08 NOTE — Telephone Encounter (Signed)
-----   Message from Trula Slade, DPM sent at 04/04/2022  9:22 AM EDT ----- ?I have put in a new referral for Dr. Pam Drown, MD at [facility]. Can you please fax this referral, clinical notes, and blood work to his office? Thanks!  ? ?

## 2022-04-08 NOTE — Telephone Encounter (Signed)
Faxed referral to [facility], confirmation received 04/08/22. ?

## 2022-04-12 ENCOUNTER — Encounter: Payer: Self-pay | Admitting: Podiatry

## 2022-04-15 ENCOUNTER — Other Ambulatory Visit: Payer: Self-pay | Admitting: Podiatry

## 2022-04-19 NOTE — Telephone Encounter (Signed)
I did email james with [facility] 04-08-2022 with the messages.  He stated that this was scripted in dec that was before going all resmend.  Pt is on Luna and is compliant.  I can send the D/L.   I reached out to MM/RN to see what else to do. She said to reach out to [facility] whom she thought was assisted with pt.  Pt had resmed  previously and was to be replaced with resmed.  Myriam Jacobson has Otto Herb to see what can be done.  ?

## 2022-04-20 NOTE — Telephone Encounter (Signed)
Received message that james from [employer] that theywill see pt, 5/11 at 2:30.   pt will be set up with resmed 10 auto with modem.  ?

## 2022-05-25 ENCOUNTER — Other Ambulatory Visit: Payer: Self-pay | Admitting: Podiatry

## 2022-05-25 NOTE — Telephone Encounter (Signed)
Duplicate therapy medications, please advise

## 2022-07-07 ENCOUNTER — Other Ambulatory Visit: Payer: Self-pay | Admitting: Podiatry

## 2022-07-07 NOTE — Telephone Encounter (Signed)
Interaction with another medication(Lexapro),please advise.

## 2022-08-08 ENCOUNTER — Other Ambulatory Visit: Payer: Self-pay | Admitting: Podiatry

## 2022-08-09 ENCOUNTER — Other Ambulatory Visit: Payer: Self-pay | Admitting: Podiatry

## 2022-08-10 MED ORDER — MELOXICAM 15 MG PO TABS: 15.0000 mg | ORAL_TABLET | Freq: Every day | ORAL | 0 refills | Status: DC

## 2022-08-10 NOTE — Telephone Encounter (Signed)
I am going to resend but if she is having issues then she should come in to be seen.

## 2022-10-13 ENCOUNTER — Other Ambulatory Visit: Payer: Self-pay | Admitting: Podiatry

## 2022-10-15 ENCOUNTER — Other Ambulatory Visit: Payer: Self-pay | Admitting: Podiatry

## 2022-10-15 ENCOUNTER — Encounter: Payer: Self-pay | Admitting: Podiatry

## 2022-11-12 ENCOUNTER — Encounter: Payer: Self-pay | Admitting: Hematology

## 2022-11-12 ENCOUNTER — Other Ambulatory Visit (HOSPITAL_COMMUNITY): Payer: Self-pay

## 2022-11-12 MED ORDER — MOUNJARO 2.5 MG/0.5ML ~~LOC~~ SOAJ
2.5000 mg | SUBCUTANEOUS | 1 refills | Status: DC
  Filled 2022-11-12: qty 2, 28d supply, fill #0
  Filled 2022-12-06: qty 2, 28d supply, fill #1

## 2022-11-15 ENCOUNTER — Other Ambulatory Visit: Payer: Self-pay | Admitting: Podiatry

## 2022-12-07 ENCOUNTER — Other Ambulatory Visit (HOSPITAL_COMMUNITY): Payer: Self-pay

## 2022-12-07 MED ORDER — GLIMEPIRIDE 2 MG PO TABS
2.0000 mg | ORAL_TABLET | Freq: Every day | ORAL | 1 refills | Status: AC
  Filled 2022-12-07 – 2022-12-09 (×4): qty 90, 90d supply, fill #0

## 2022-12-08 ENCOUNTER — Other Ambulatory Visit (HOSPITAL_COMMUNITY): Payer: Self-pay

## 2022-12-08 ENCOUNTER — Other Ambulatory Visit: Payer: Self-pay

## 2022-12-09 ENCOUNTER — Other Ambulatory Visit (HOSPITAL_COMMUNITY): Payer: Self-pay

## 2022-12-14 ENCOUNTER — Other Ambulatory Visit (HOSPITAL_COMMUNITY): Payer: Self-pay

## 2022-12-14 MED ORDER — MOUNJARO 5 MG/0.5ML ~~LOC~~ SOAJ
5.0000 mg | SUBCUTANEOUS | 5 refills | Status: DC
  Filled 2022-12-14: qty 2, 28d supply, fill #0

## 2022-12-15 ENCOUNTER — Ambulatory Visit: Payer: Managed Care, Other (non HMO) | Admitting: Podiatry

## 2022-12-15 DIAGNOSIS — M722 Plantar fascial fibromatosis: Secondary | ICD-10-CM | POA: Diagnosis not present

## 2022-12-15 DIAGNOSIS — B351 Tinea unguium: Secondary | ICD-10-CM

## 2022-12-15 DIAGNOSIS — Z79899 Other long term (current) drug therapy: Secondary | ICD-10-CM | POA: Diagnosis not present

## 2022-12-15 MED ORDER — METHYLPREDNISOLONE 4 MG PO TBPK: ORAL_TABLET | ORAL | 0 refills | Status: DC

## 2022-12-15 MED ORDER — TERBINAFINE HCL 250 MG PO TABS
250.0000 mg | ORAL_TABLET | Freq: Every day | ORAL | 2 refills | Status: AC
Start: 2022-12-15 — End: 2023-03-15

## 2022-12-15 MED ORDER — MELOXICAM 15 MG PO TABS: 15.0000 mg | ORAL_TABLET | Freq: Every day | ORAL | 0 refills | Status: AC

## 2022-12-15 NOTE — Progress Notes (Signed)
Subjective:  Patient ID: Denise Barnett, female    DOB: Feb 06, 1971,  MRN: [id number]  Chief Complaint  Patient presents with   Plantar Fasciitis    52 y.o. female presents with the above complaint.  Patient presents with bilateral Planter fasciitis/heel pain.  Patient states pain for touch is progressive and worsens with ambulation worse with pressure.  She has secondary complaint of onychomycosis to both second digit.  She would like to discuss treatment options for that she is tried over-the-counter options none of which has helped.  She denies any other acute complaints.  Hurts with ambulation worse with pressure.  She is a diabetic   Review of Systems: Negative except as noted in the HPI. Denies N/V/F/Ch.  Past Medical History:  Diagnosis Date   Anxiety    Bronchitis    Diabetes mellitus without complication (HCC)    Type II   Heel spur, left    Incisional hernia    Lichen planus    OSA (obstructive sleep apnea) 12/25/2013   cpap- does not know settings    Pneumonia    hx of     Current Outpatient Medications:    meloxicam (MOBIC) 15 MG tablet, Take 1 tablet (15 mg total) by mouth daily., Disp: 30 tablet, Rfl: 0   methylPREDNISolone (MEDROL DOSEPAK) 4 MG TBPK tablet, Take as directed, Disp: 21 each, Rfl: 0   terbinafine (LAMISIL) 250 MG tablet, Take 1 tablet (250 mg total) by mouth daily., Disp: 30 tablet, Rfl: 2   Apremilast 30 MG TABS, Take 30 mg by mouth 2 (two) times daily. , Disp: , Rfl:    atorvastatin (LIPITOR) 20 MG tablet, Take 20 mg by mouth daily., Disp: , Rfl:    clobetasol cream (TEMOVATE) 7.90 %, Apply 1 application topically daily as needed (psoriasis). , Disp: , Rfl:    Continuous Blood Gluc Sensor (FREESTYLE LIBRE 14 DAY SENSOR) MISC, Apply topically., Disp: , Rfl:    cyclobenzaprine (FLEXERIL) 10 MG tablet, Take 5-10 mg by mouth daily as needed for muscle spasms. , Disp: , Rfl:    diclofenac (VOLTAREN) 75 MG EC tablet, TAKE 1 TABLET (75 MG TOTAL) BY MOUTH  2 (TWO) TIMES DAILY WITH A MEAL., Disp: 60 tablet, Rfl: 0   escitalopram (LEXAPRO) 20 MG tablet, Take 20 mg by mouth daily., Disp: , Rfl:    ferrous sulfate 325 (65 FE) MG tablet, , Disp: , Rfl:    fluticasone (CUTIVATE) 0.05 % cream, fluticasone propionate 0.05 % topical cream, Disp: , Rfl:    gabapentin (NEURONTIN) 100 MG capsule, Take 100 mg by mouth 3 (three) times daily., Disp: , Rfl:    gabapentin (NEURONTIN) 300 MG capsule, Take 300 mg by mouth daily., Disp: , Rfl:    gabapentin (NEURONTIN) 300 MG capsule, , Disp: , Rfl:    glimepiride (AMARYL) 2 MG tablet, Take 1 tablet (2 mg total) by mouth daily., Disp: 90 tablet, Rfl: 1   glucose blood (ONETOUCH VERIO) test strip, , Disp: , Rfl:    hydrOXYzine (VISTARIL) 25 MG capsule, Take 25-50 mg by mouth at bedtime., Disp: , Rfl:    icosapent Ethyl (VASCEPA) 1 g capsule, Take 2 g by mouth 2 (two) times daily., Disp: , Rfl:    JARDIANCE 25 MG TABS tablet, Take 25 mg by mouth daily., Disp: , Rfl:    meloxicam (MOBIC) 15 MG tablet, TAKE 1 TABLET (15 MG TOTAL) BY MOUTH DAILY., Disp: 30 tablet, Rfl: 0   metFORMIN (GLUCOPHAGE) 1000 MG tablet,  Take 1,000 mg by mouth 2 (two) times daily with a meal. , Disp: , Rfl: 0   Norgestimate-Ethinyl Estradiol Triphasic 0.18/0.215/0.25 MG-35 MCG tablet, Take 1 tablet by mouth at bedtime. , Disp: , Rfl:    OneTouch Delica Lancets 94R MISC, , Disp: , Rfl:    PAXLOVID, 300/100, 20 x 150 MG & 10 x '100MG'$  TBPK, See admin instructions., Disp: , Rfl:    polyethylene glycol-electrolytes (NULYTELY) 420 g solution, peg-electrolyte solution 420 gram oral solution, Disp: , Rfl:    tirzepatide (MOUNJARO) 2.5 MG/0.5ML Pen, Inject 2.5 mg into the skin once a week., Disp: 2 mL, Rfl: 1   tirzepatide (MOUNJARO) 5 MG/0.5ML Pen, Inject 5 mg into the skin once a week., Disp: 2 mL, Rfl: 5   tirzepatide (MOUNJARO) 7.5 MG/0.5ML Pen, Inject 7.5 mg into the skin once a week., Disp: 2 mL, Rfl: 5   valACYclovir (VALTREX) 500 MG tablet, Take 500  mg by mouth 2 (two) times daily as needed (cold sores). , Disp: , Rfl: 2  Social History   Tobacco Use  Smoking Status Former   Packs/day: 0.75   Years: 20.00   Total pack years: 15.00   Types: Cigarettes   Quit date: 2010   Years since quitting: 14.0  Smokeless Tobacco Never    Allergies  Allergen Reactions   Bactrim [Sulfamethoxazole-Trimethoprim] Rash   Penicillins Rash    Has patient had a PCN reaction causing immediate rash, facial/tongue/throat swelling, SOB or lightheadedness with hypotension: No Has patient had a PCN reaction causing severe rash involving mucus membranes or skin necrosis: Unknown Has patient had a PCN reaction that required hospitalization: No Has patient had a PCN reaction occurring within the last 10 years: No If all of the above answers are "NO", then may proceed with Cephalosporin use.    Objective:  There were no vitals filed for this visit. There is no height or weight on file to calculate BMI. Constitutional Well developed. Well nourished.  Vascular Dorsalis pedis pulses palpable bilaterally. Posterior tibial pulses palpable bilaterally. Capillary refill normal to all digits.  No cyanosis or clubbing noted. Pedal hair growth normal.  Neurologic Normal speech. Oriented to person, place, and time. Epicritic sensation to light touch grossly present bilaterally.  Dermatologic Nails well groomed and normal in appearance. No open wounds. No skin lesions.  Orthopedic: Normal joint ROM without pain or crepitus bilaterally. No visible deformities. Tender to palpation at the calcaneal tuber bilaterally. No pain with calcaneal squeeze bilaterally. Ankle ROM diminished range of motion bilaterally. Silfverskiold Test: positive bilaterally.   Radiographs: None  Assessment:   1. Plantar fasciitis, right   2. Plantar fasciitis, left   3. Onychomycosis   4. Nail fungus   5. Long-term use of high-risk medication    Plan:  Patient was evaluated  and treated and all questions answered.  Bilateral second digit onychomycosis Educated the patient on the etiology of onychomycosis and various treatment options associated with improving the fungal load.  I explained to the patient that there is 3 treatment options available to treat the onychomycosis including topical, p.o., laser treatment.  Patient elected to undergo p.o. options with Lamisil/terbinafine therapy.  In order for me to start the medication therapy, I explained to the patient the importance of evaluating the liver and obtaining the liver function test.  Once the liver function test comes back normal I will start him on 14-monthcourse of Lamisil therapy.  Patient understood all risk and would like to proceed with  Lamisil therapy.  I have asked the patient to immediately stop the Lamisil therapy if she has any reactions to it and call the office or go to the emergency room right away.  Patient states understanding   Plantar Fasciitis, bilaterally - XR reviewed as above.  - Educated on icing and stretching. Instructions given.  - Injection delivered to the plantar fascia as below. - DME: Plantar fascial brace dispensed to support the medial longitudinal arch of the foot and offload pressure from the heel and prevent arch collapse during weightbearing - Pharmacologic management: Meloxicam and Medrol Dosepak  Procedure: Injection Tendon/Ligament Location: Bilateral plantar fascia at the glabrous junction; medial approach. Skin Prep: alcohol Injectate: 0.5 cc 0.5% marcaine plain, 0.5 cc of 1% Lidocaine, 0.5 cc kenalog 10. Disposition: Patient tolerated procedure well. Injection site dressed with a band-aid.  No follow-ups on file.  Bilateral Planter fasciitis injection brace meloxicam Medrol Dosepak  Bilateral second digit onychomycosis n liver function test is normal had it done yesterday Lamisil 90 days

## 2022-12-16 ENCOUNTER — Other Ambulatory Visit (HOSPITAL_COMMUNITY): Payer: Self-pay

## 2022-12-16 MED ORDER — MOUNJARO 7.5 MG/0.5ML ~~LOC~~ SOAJ
7.5000 mg | SUBCUTANEOUS | 5 refills | Status: DC
  Filled 2022-12-16: qty 2, 28d supply, fill #0
  Filled 2023-01-12: qty 2, 28d supply, fill #1
  Filled 2023-02-13: qty 2, 28d supply, fill #2
  Filled 2023-03-13: qty 2, 28d supply, fill #3

## 2022-12-22 ENCOUNTER — Encounter: Payer: Self-pay | Admitting: Podiatry

## 2022-12-24 ENCOUNTER — Other Ambulatory Visit (HOSPITAL_COMMUNITY): Payer: Self-pay

## 2023-01-12 ENCOUNTER — Other Ambulatory Visit: Payer: Self-pay | Admitting: Podiatry

## 2023-01-21 ENCOUNTER — Other Ambulatory Visit: Payer: Self-pay | Admitting: Sports Medicine

## 2023-01-21 DIAGNOSIS — M25561 Pain in right knee: Secondary | ICD-10-CM

## 2023-01-28 ENCOUNTER — Ambulatory Visit
Admission: RE | Admit: 2023-01-28 | Discharge: 2023-01-28 | Disposition: A | Discharge status: Home / Self Care | Payer: Managed Care, Other (non HMO) | Source: Ambulatory Visit | Attending: Sports Medicine

## 2023-01-28 DIAGNOSIS — M25561 Pain in right knee: Secondary | ICD-10-CM

## 2023-03-15 ENCOUNTER — Ambulatory Visit: Payer: Managed Care, Other (non HMO) | Admitting: Podiatry

## 2023-03-15 ENCOUNTER — Ambulatory Visit (INDEPENDENT_AMBULATORY_CARE_PROVIDER_SITE_OTHER): Payer: Managed Care, Other (non HMO)

## 2023-03-15 DIAGNOSIS — R52 Pain, unspecified: Secondary | ICD-10-CM | POA: Diagnosis not present

## 2023-03-15 DIAGNOSIS — M778 Other enthesopathies, not elsewhere classified: Secondary | ICD-10-CM

## 2023-03-15 DIAGNOSIS — M7732 Calcaneal spur, left foot: Secondary | ICD-10-CM

## 2023-03-15 DIAGNOSIS — M722 Plantar fascial fibromatosis: Secondary | ICD-10-CM

## 2023-03-15 MED ORDER — MELOXICAM 15 MG PO TABS: 15.0000 mg | ORAL_TABLET | Freq: Every day | ORAL | 0 refills | Status: DC | PRN

## 2023-03-15 MED ORDER — TRIAMCINOLONE ACETONIDE 10 MG/ML IJ SUSP: 10.0000 mg | Freq: Once | INTRAMUSCULAR | Status: AC

## 2023-03-15 NOTE — Patient Instructions (Signed)

## 2023-03-16 NOTE — Progress Notes (Signed)
Subjective: Chief Complaint  Patient presents with   Foot Pain    pATIENT COMPLAINS OF PAIN LON THE LEFT LATERAL SIDE OF FOOT CLOSER TO THE TOE AREA, PATIENT STATED THAT THE PAIN IS NEW AND NOTHING HAS CHANGED IN HER DAILY ACTIVITIES    52 year old female presents the office today for concerns of pain the lateral aspect of the left foot she points more along the fifth MPJ area where she is getting discomfort.  No injuries that she reports.  She was seen in the office in January for plan fasciitis and she had injections performed and she states that it was helpful but still in some mild discomfort.  No recent injuries or changes otherwise.  Objective: AAO x3, NAD DP/PT pulses palpable bilaterally, CRT less than 3 seconds Tenderness palpation on the left fifth MPJ there is localized edema and erythema.  There is no area pinpoint tenderness otherwise.  There is mild discomfort on the plantar aspect of the calcaneus on insertion of plantar fascia.  Plantar fascia appears to be intact.  No pain with lateral compression of the calcaneus.  MMT 5/5. No pain with calf compression, swelling, warmth, erythema  Assessment: 52 year old female capsulitis left fifth MPJ; plan fasciitis  Plan: -All treatment options discussed with the patient including all alternatives, risks, complications.  -X-rays were obtained of the left foot.  Increased soft tissue density noted on the left fifth MPJ.  No evidence of acute fracture.  Heel spur present. -Steroid injection performed.  Skin was cleaned with alcohol and mixture of 1 cc Kenalog 10, 0.5 cc Marcaine plain, 0.5 cc of lidocaine plain was infiltrated into around the fifth MPJ without complications or bleeding.  Postinjection care discussed.  Dispensed offloading pads. -For the plantar fasciitis encouraged continued stretching, icing on a regular basis as well as shoes and good arch support.  If needed can repeat steroid injection but we held on this  today. -Patient encouraged to call the office with any questions, concerns, change in symptoms.    Trula Slade DPM

## 2023-04-11 ENCOUNTER — Other Ambulatory Visit: Payer: Self-pay | Admitting: Podiatry

## 2023-04-14 ENCOUNTER — Other Ambulatory Visit (HOSPITAL_COMMUNITY): Payer: Self-pay

## 2023-05-10 ENCOUNTER — Encounter: Payer: Self-pay | Admitting: Podiatry

## 2023-05-13 MED ORDER — METHYLPREDNISOLONE 4 MG PO TBPK: ORAL_TABLET | ORAL | 0 refills | Status: DC

## 2023-05-17 ENCOUNTER — Ambulatory Visit (INDEPENDENT_AMBULATORY_CARE_PROVIDER_SITE_OTHER): Payer: Managed Care, Other (non HMO)

## 2023-05-17 ENCOUNTER — Ambulatory Visit: Payer: Managed Care, Other (non HMO) | Admitting: Podiatry

## 2023-05-17 DIAGNOSIS — Q66222 Congenital metatarsus adductus, left foot: Secondary | ICD-10-CM

## 2023-05-17 DIAGNOSIS — M722 Plantar fascial fibromatosis: Secondary | ICD-10-CM

## 2023-05-17 NOTE — Progress Notes (Signed)
Subjective: Chief Complaint  Patient presents with   Foot Pain    Patient came in today for left foot pain, 4 days ago patient had a sharp pain and was unable to walk, rate of pain 7 out of 10, TX: Medrol dosepak   52 year old female presents the above concerns.  She is 28 and she stood up and she has sharp pain and tingling when she put weight on her foot but this went away.  No injuries that she recalls.  No increase in swelling.  Objective: AAO x3, NAD DP/PT pulses palpable bilaterally, CRT less than 3 seconds Unable to elicit any area of pinpoint tenderness to the left foot.  Clinically the flexor, extensor tendons appear intact.  Negative Tinel sign. MMT 5/5.  No pain with calf compression, swelling, warmth, erythema  Assessment: 52 year old female with left foot pain  Plan: -All treatment options discussed with the patient including all alternatives, risks, complications.  -X-rays obtained reviewed.  3 views of the feet were obtained.  No subacute fracture noted.  Metatarsus adductus noted.  Calcaneal spurring present. -Postprandial symptoms that she was experiencing have improved.  However foot pain is biomechanical in nature given her metatarsal status, heel spur as well with her history of chronic fasciitis.  We discussed shoes, good arch support and changing shoes.  Also she works at [employer] on hard surfaces and discussed arch support to provide more cushion in conjunction with support. -Patient encouraged to call the office with any questions, concerns, change in symptoms.   Vivi Barrack DPM

## 2023-06-10 ENCOUNTER — Encounter: Payer: Self-pay | Admitting: Podiatry

## 2023-06-13 ENCOUNTER — Other Ambulatory Visit: Payer: Self-pay | Admitting: Podiatry

## 2023-06-30 ENCOUNTER — Other Ambulatory Visit (HOSPITAL_COMMUNITY): Payer: Self-pay

## 2023-06-30 MED ORDER — MOUNJARO 12.5 MG/0.5ML ~~LOC~~ SOAJ
12.5000 mg | SUBCUTANEOUS | 5 refills | Status: DC
  Filled 2023-06-30 – 2023-07-01 (×2): qty 2, 28d supply, fill #0
  Filled 2023-10-19: qty 2, 28d supply, fill #1
  Filled 2023-12-15: qty 2, 28d supply, fill #2

## 2023-07-01 ENCOUNTER — Other Ambulatory Visit (HOSPITAL_COMMUNITY): Payer: Self-pay

## 2023-07-01 ENCOUNTER — Other Ambulatory Visit: Payer: Self-pay

## 2023-07-01 MED ORDER — MOUNJARO 12.5 MG/0.5ML ~~LOC~~ SOAJ
12.5000 mg | SUBCUTANEOUS | 5 refills | Status: DC
  Filled 2023-07-01 – 2023-07-27 (×3): qty 2, 28d supply, fill #0
  Filled 2023-08-18: qty 2, 28d supply, fill #1
  Filled 2023-09-21: qty 2, 28d supply, fill #2
  Filled 2023-11-13: qty 2, 28d supply, fill #3
  Filled 2024-01-09: qty 2, 28d supply, fill #4
  Filled 2024-02-07: qty 2, 28d supply, fill #5

## 2023-07-25 ENCOUNTER — Other Ambulatory Visit (HOSPITAL_COMMUNITY): Payer: Self-pay

## 2023-07-27 ENCOUNTER — Encounter: Payer: Self-pay | Admitting: Podiatry

## 2023-07-28 ENCOUNTER — Other Ambulatory Visit (HOSPITAL_COMMUNITY): Payer: Self-pay

## 2023-08-08 ENCOUNTER — Other Ambulatory Visit (HOSPITAL_COMMUNITY): Payer: Self-pay

## 2023-08-08 MED ORDER — BASAGLAR KWIKPEN 100 UNIT/ML ~~LOC~~ SOPN
10.0000 [IU] | PEN_INJECTOR | Freq: Every day | SUBCUTANEOUS | 5 refills | Status: AC
  Filled 2023-08-08: qty 3, 30d supply, fill #0

## 2023-08-11 ENCOUNTER — Other Ambulatory Visit (HOSPITAL_COMMUNITY): Payer: Self-pay

## 2023-08-13 ENCOUNTER — Other Ambulatory Visit: Payer: Self-pay | Admitting: Podiatry

## 2023-08-17 ENCOUNTER — Other Ambulatory Visit (HOSPITAL_COMMUNITY): Payer: Self-pay

## 2023-08-17 ENCOUNTER — Encounter (HOSPITAL_COMMUNITY): Payer: Self-pay

## 2023-08-17 MED ORDER — BASAGLAR KWIKPEN 100 UNIT/ML ~~LOC~~ SOPN
40.0000 [IU] | PEN_INJECTOR | Freq: Every evening | SUBCUTANEOUS | 5 refills | Status: AC
  Filled 2023-08-17: qty 15, 37d supply, fill #0

## 2023-08-17 MED ORDER — BASAGLAR KWIKPEN 100 UNIT/ML ~~LOC~~ SOPN
40.0000 [IU] | PEN_INJECTOR | Freq: Every day | SUBCUTANEOUS | 5 refills | Status: AC
  Filled 2023-08-17 (×2): qty 90, 90d supply, fill #0

## 2023-08-18 ENCOUNTER — Encounter (HOSPITAL_COMMUNITY): Payer: Self-pay

## 2023-08-18 ENCOUNTER — Other Ambulatory Visit (HOSPITAL_COMMUNITY): Payer: Self-pay

## 2023-08-19 ENCOUNTER — Encounter (HOSPITAL_COMMUNITY): Payer: Self-pay

## 2023-08-19 ENCOUNTER — Other Ambulatory Visit (HOSPITAL_COMMUNITY): Payer: Self-pay

## 2023-10-06 ENCOUNTER — Encounter: Payer: Self-pay | Admitting: Podiatry

## 2023-10-06 NOTE — Telephone Encounter (Signed)
Can you schedule her an appt? Thanks

## 2023-10-11 ENCOUNTER — Ambulatory Visit: Payer: Managed Care, Other (non HMO) | Admitting: Podiatry

## 2023-10-11 DIAGNOSIS — M778 Other enthesopathies, not elsewhere classified: Secondary | ICD-10-CM

## 2023-10-11 NOTE — Progress Notes (Signed)
Subjective: 52 year old female presents the office today for concerns of pain to her left foot worse than right.  Most of her pain on the fifth MTPJ posterior to get some discomfort of the right first MPJ she points to as well.  She does not report any injury.  She also states that she has purchased new shoes.  No other concerns.  Objective: AAO x3, NAD DP/PT pulses palpable bilaterally, CRT less than 3 seconds Tenderness palpation mostly on the left fifth MTPJ on the area but there is localized edema erythema.  Mild discomfort at the first MTPJ.  No skin edema, erythema.  No other areas of pinpoint tenderness identified today. No pain with calf compression, swelling, warmth, erythema  Assessment: Capsulitis left foot  Plan: -All treatment options discussed with the patient including all alternatives, risks, complications.  -We discussed steroid injection and she wishes to proceed.  Clean skin with alcohol.  A mixture of 0.5 cc assessment and phosphate, 0.5 cc of Marcaine plain was infiltrated to both the first and fifth MTPJ's without any complications.  Postinjection care discussed.  Hide well.  Since helping pads.  Continue supportive shoe gear. -Patient encouraged to call the office with any questions, concerns, change in symptoms.   Vivi Barrack DPM

## 2023-10-11 NOTE — Patient Instructions (Signed)

## 2023-10-18 ENCOUNTER — Ambulatory Visit: Payer: Managed Care, Other (non HMO) | Admitting: Podiatry

## 2023-10-30 ENCOUNTER — Other Ambulatory Visit: Payer: Self-pay | Admitting: Podiatry

## 2023-11-14 ENCOUNTER — Other Ambulatory Visit (HOSPITAL_COMMUNITY): Payer: Self-pay

## 2023-11-15 ENCOUNTER — Encounter: Payer: Self-pay | Admitting: Podiatry

## 2023-11-29 ENCOUNTER — Other Ambulatory Visit (HOSPITAL_COMMUNITY): Payer: Self-pay

## 2023-11-29 MED ORDER — INSULIN LISPRO PROT & LISPRO (75-25 MIX) 100 UNIT/ML KWIKPEN
25.0000 [IU] | PEN_INJECTOR | Freq: Two times a day (BID) | SUBCUTANEOUS | 5 refills | Status: AC
  Filled 2023-11-29: qty 45, 90d supply, fill #0

## 2024-01-07 ENCOUNTER — Other Ambulatory Visit: Payer: Self-pay | Admitting: Podiatry

## 2024-01-09 ENCOUNTER — Other Ambulatory Visit: Payer: Self-pay

## 2024-01-09 ENCOUNTER — Other Ambulatory Visit (HOSPITAL_COMMUNITY): Payer: Self-pay

## 2024-01-09 MED ORDER — INSULIN LISPRO PROT & LISPRO (75-25 MIX) 100 UNIT/ML KWIKPEN
50.0000 [IU] | PEN_INJECTOR | Freq: Two times a day (BID) | SUBCUTANEOUS | 5 refills | Status: AC
  Filled 2024-01-09: qty 27, 27d supply, fill #0
  Filled 2024-01-09: qty 63, 63d supply, fill #0

## 2024-01-11 ENCOUNTER — Other Ambulatory Visit (HOSPITAL_COMMUNITY): Payer: Self-pay

## 2024-02-10 ENCOUNTER — Other Ambulatory Visit (HOSPITAL_COMMUNITY): Payer: Self-pay

## 2024-02-10 MED ORDER — MOUNJARO 15 MG/0.5ML ~~LOC~~ SOAJ
15.0000 mg | SUBCUTANEOUS | 5 refills | Status: AC
  Filled 2024-02-10: qty 6, 84d supply, fill #0
  Filled 2024-04-30: qty 6, 84d supply, fill #1
  Filled 2024-07-19: qty 6, 84d supply, fill #2
  Filled 2024-10-09: qty 6, 84d supply, fill #3
  Filled 2024-12-24: qty 6, 84d supply, fill #4

## 2024-02-10 MED ORDER — INSULIN LISPRO PROT & LISPRO (75-25 MIX) 100 UNIT/ML KWIKPEN
PEN_INJECTOR | SUBCUTANEOUS | 5 refills | Status: AC
  Filled 2024-02-10: qty 33, 30d supply, fill #0

## 2024-02-29 ENCOUNTER — Other Ambulatory Visit (HOSPITAL_COMMUNITY): Payer: Self-pay

## 2024-02-29 ENCOUNTER — Encounter (HOSPITAL_COMMUNITY): Payer: Self-pay

## 2024-02-29 ENCOUNTER — Encounter: Payer: Self-pay | Admitting: Hematology

## 2024-02-29 MED ORDER — PEN NEEDLES 32G X 6 MM MISC
1.0000 | Freq: Two times a day (BID) | 3 refills | Status: AC
  Filled 2024-02-29: qty 200, 90d supply, fill #0
  Filled 2024-05-25 – 2024-06-07 (×2): qty 200, 90d supply, fill #1
  Filled 2024-09-06: qty 200, 90d supply, fill #2

## 2024-03-25 ENCOUNTER — Other Ambulatory Visit: Payer: Self-pay | Admitting: Podiatry

## 2024-04-05 ENCOUNTER — Encounter (HOSPITAL_COMMUNITY): Payer: Self-pay

## 2024-04-05 ENCOUNTER — Other Ambulatory Visit (HOSPITAL_COMMUNITY): Payer: Self-pay

## 2024-04-05 MED ORDER — INSULIN LISPRO PROT & LISPRO (75-25 MIX) 100 UNIT/ML KWIKPEN
PEN_INJECTOR | SUBCUTANEOUS | 5 refills | Status: AC
  Filled 2024-04-05: qty 99, 90d supply, fill #0
  Filled 2024-06-07 – 2024-06-18 (×4): qty 99, 90d supply, fill #1

## 2024-04-11 ENCOUNTER — Encounter: Payer: Self-pay | Admitting: Podiatry

## 2024-04-11 NOTE — Telephone Encounter (Signed)
 Can someone please assist her in scheduling? Thanks!

## 2024-04-13 ENCOUNTER — Ambulatory Visit: Admitting: Podiatry

## 2024-04-13 DIAGNOSIS — M778 Other enthesopathies, not elsewhere classified: Secondary | ICD-10-CM | POA: Diagnosis not present

## 2024-04-13 DIAGNOSIS — M722 Plantar fascial fibromatosis: Secondary | ICD-10-CM | POA: Diagnosis not present

## 2024-04-13 NOTE — Patient Instructions (Signed)

## 2024-04-13 NOTE — Progress Notes (Unsigned)
 Subjective: Chief Complaint  Patient presents with   Plantar Fasciitis    RM#11 Patient presents today with bilateral foot pain requesting injections. Previous injections provided good relief.    53 year old female presents the office with above concerns.  She states that her foot pain is started coming back again.  She gets at the lateral aspect left foot in the 5th MTPJ and to the right heel.  She does not report any recent injuries or changes.  She does a lot of standing at work.  Objective: AAO x3, NAD DP/PT pulses palpable bilaterally, CRT less than 3 seconds There is tenderness palpation along the left fifth MTPJ with localized edema but is no area.  There is no area pinpoint tenderness in the left foot.  The right foot there is tenderness palpation along the plantar medial tubercle of the calcaneus at the insertion of the plantar fascia.  No pain with Achilles tendon.  5/5. No pain with calf compression, swelling, warmth, erythema  Assessment: Left foot capsulitis, right foot plantar fasciitis  Plan: -All treatment options discussed with the patient including all alternatives, risks, complications.  -Patient was proceed with injections.  See procedure note below.  Continue shoes, good support. -If symptoms persist repeat x-rays -Patient encouraged to call the office with any questions, concerns, change in symptoms.   Procedure: Injection Tendon/Ligament Discussed alternatives, risks, complications and verbal consent was obtained.  Location: Right plantar fascia at the glabrous junction; medial approach. Skin Prep: Alcohol. Injectate: 0.5cc 0.5% marcaine  plain, 0.5 cc 2% lidocaine  plain and, 1 cc kenalog  10. Disposition: Patient tolerated procedure well. Injection site dressed with a band-aid.  Post-injection care was discussed and return precautions discussed.   Procedure: Injection Small Joint Discussed alternatives, risks, complications and verbal consent was obtained.   Location: Left fifth MTPJ Injectate: 0.5cc 0.5% marcaine  plain, 0.5 cc 2% lidocaine  plain and, 1 cc kenalog  10. Disposition: Patient tolerated procedure well. Injection site dressed with a band-aid.  Post-injection care was discussed and return precautions discussed.   Charity Conch DPM

## 2024-04-30 ENCOUNTER — Other Ambulatory Visit (HOSPITAL_COMMUNITY): Payer: Self-pay

## 2024-05-25 ENCOUNTER — Other Ambulatory Visit (HOSPITAL_COMMUNITY): Payer: Self-pay

## 2024-05-25 ENCOUNTER — Encounter: Payer: Self-pay | Admitting: Hematology

## 2024-06-06 ENCOUNTER — Other Ambulatory Visit (HOSPITAL_COMMUNITY): Payer: Self-pay

## 2024-06-07 ENCOUNTER — Encounter: Payer: Self-pay | Admitting: Hematology

## 2024-06-07 ENCOUNTER — Other Ambulatory Visit: Payer: Self-pay | Admitting: Podiatry

## 2024-06-07 ENCOUNTER — Other Ambulatory Visit (HOSPITAL_COMMUNITY): Payer: Self-pay

## 2024-06-12 ENCOUNTER — Other Ambulatory Visit (HOSPITAL_COMMUNITY): Payer: Self-pay

## 2024-06-12 ENCOUNTER — Encounter (HOSPITAL_COMMUNITY): Payer: Self-pay

## 2024-06-13 ENCOUNTER — Other Ambulatory Visit (HOSPITAL_COMMUNITY): Payer: Self-pay

## 2024-06-18 ENCOUNTER — Other Ambulatory Visit: Payer: Self-pay

## 2024-06-19 ENCOUNTER — Other Ambulatory Visit (HOSPITAL_COMMUNITY): Payer: Self-pay

## 2024-07-10 ENCOUNTER — Other Ambulatory Visit (HOSPITAL_COMMUNITY): Payer: Self-pay

## 2024-07-12 ENCOUNTER — Encounter: Payer: Self-pay | Admitting: Podiatry

## 2024-07-12 ENCOUNTER — Ambulatory Visit (INDEPENDENT_AMBULATORY_CARE_PROVIDER_SITE_OTHER)

## 2024-07-12 ENCOUNTER — Ambulatory Visit: Admitting: Podiatry

## 2024-07-12 DIAGNOSIS — M722 Plantar fascial fibromatosis: Secondary | ICD-10-CM

## 2024-07-12 DIAGNOSIS — M62461 Contracture of muscle, right lower leg: Secondary | ICD-10-CM

## 2024-07-12 MED ORDER — TRIAMCINOLONE ACETONIDE 10 MG/ML IJ SUSP
10.0000 mg | Freq: Once | INTRAMUSCULAR | Status: AC
  Administered 2024-07-12: 10 mg

## 2024-07-12 NOTE — Patient Instructions (Signed)

## 2024-07-12 NOTE — Progress Notes (Signed)
 Chief Complaint  Patient presents with   Foot Pain    Pt is here today due to R heel pain and occasional shooting pain in R third toe Diabetic Last A1c 8.14 Jan 2024     HPI: 53 y.o. female presenting today with c/o pain in the bottom of the right heel.  Still with plantar fasciitis of both feet previously.  Pain has flared up to the right heel over the past 4 to 5 days or so.  Is worse with ambulation and after periods of rest.  Past Medical History:  Diagnosis Date   Anxiety    Bronchitis    Diabetes mellitus without complication (HCC)    Type II   Heel spur, left    Incisional hernia    Lichen planus    OSA (obstructive sleep apnea) 12/25/2013   cpap- does not know settings    Pneumonia    hx of    Past Surgical History:  Procedure Laterality Date   BILATERAL SALPINGECTOMY Left 09/15/2017   Procedure: LEFT SALPINGECTOMY;  Surgeon: Eloy Herring, MD;  Location: [facility];  Service: Gynecology;  Laterality: Left;   FOREIGN BODY REMOVAL ABDOMINAL N/A 11/07/2018   Procedure: REMOVAL FOREIGN BODY ABDOMINAL;  Surgeon: Rubin Calamity, MD;  Location: [facility] OR;  Service: General;  Laterality: N/A;   INCISIONAL HERNIA REPAIR N/A 05/29/2018   Procedure: LAPAROSCOPIC INCISIONAL HERNIA;  Surgeon: Rubin Calamity, MD;  Location: [facility] OR;  Service: General;  Laterality: N/A;   INSERTION OF MESH N/A 05/29/2018   Procedure: INSERTION OF MESH;  Surgeon: Rubin Calamity, MD;  Location: [facility] OR;  Service: General;  Laterality: N/A;   LAPAROSCOPY N/A 11/07/2018   Procedure: LAPAROSCOPY DIAGNOSTIC;  Surgeon: Rubin Calamity, MD;  Location: [facility] OR;  Service: General;  Laterality: N/A;   LAPAROTOMY Bilateral 09/15/2017   Procedure: EXPLORATORY LAPAROTOMY;  Surgeon: Eloy Herring, MD;  Location: [facility];  Service: Gynecology;  Laterality: Bilateral;   none     OOPHORECTOMY Left 09/15/2017   Procedure: LEFT OOPHORECTOMY;  Surgeon: Eloy Herring, MD;  Location: [facility];  Service: Gynecology;  Laterality: Left;    PAROTIDECTOMY Right 01/02/2020   Procedure: RIGHT PAROTIDECTOMY;  Surgeon: Karis Clunes, MD;  Location[facility] OR;  Service: ENT;  Laterality: Right;   VIDEO BRONCHOSCOPY Bilateral 04/18/2017   Procedure: VIDEO BRONCHOSCOPY WITHOUT FLUORO;  Surgeon: Clotilda Molinda MATSU, MD;  Location: [facility] ENDOSCOPY;  Service: Endoscopy;  Laterality: Bilateral;   Allergies  Allergen Reactions   Bactrim  [Sulfamethoxazole -Trimethoprim ] Rash   Penicillins Rash    Has patient had a PCN reaction causing immediate rash, facial/tongue/throat swelling, SOB or lightheadedness with hypotension: No Has patient had a PCN reaction causing severe rash involving mucus membranes or skin necrosis: Unknown Has patient had a PCN reaction that required hospitalization: No Has patient had a PCN reaction occurring within the last 10 years: No If all of the above answers are NO, then may proceed with Cephalosporin use.      Physical Exam: General: The patient is alert and oriented x3 in no acute distress.  Dermatology:  No ecchymosis, erythema, or edema bilateral.  No open lesions.    Vascular: Palpable pedal pulses bilaterally. Capillary refill within normal limits.  No appreciable edema.    Neurological: Epicritic sensation is intact  Musculoskeletal Exam:  There is pain on palpation of the plantarmedial & plantarcentral aspect of right heel.  No gaps or nodules within the plantar fascia.  Positive Windlass mechanism bilateral.  Antalgic gait noted with first few  steps upon standing.  No pain on palpation of achilles tendon bilateral.  Ankle df less than 10 degrees with knee extended b/l.  Radiographic Exam: Right foot 3 views weightbearing 07/12/2024 Normal osseous mineralization. Joint spaces preserved.  No fractures noted.  Plantar and posterior calcaneal enthesophyte formation noted.  Assessment/Plan of Care: 1. Plantar fasciitis, right   2. Gastrocnemius equinus, right     Meds ordered this encounter  Medications    triamcinolone  acetonide (KENALOG ) 10 MG/ML injection 10 mg   None  -Reviewed etiology of plantar fasciitis with patient.  Discussed treatment options with patient today, including cortisone injection, NSAID course of treatment, stretching exercises, physical therapy, use of night splint, rest, icing the heel, arch supports/orthotics, and supportive shoe gear.    With the patient's verbal consent, a corticosteroid injection was administered to the right heel, consisting of a mixture of 1 cc of 2% lidocaine  plain mixed with 0.5% Sensorcaine  plain, and 1 mL Kenalog -10 for a total of 2.0 cc administered.  A Band-aid was applied.  Patient tolerated well  Plantar fascia brace today for right foot.  This is an ankle gauntlet device with elastic strap to offload the medial arch and medial band of plantar fascia.  Stretching exercises discussed at length patient with written instructions dispensed  Recommend course of OTC naproxen  over the next 2 to 3 weeks twice daily as needed   Return in about 3 weeks (around 08/02/2024) for Plantar Fasciitis.    Ethan LITTIE Saddler, DPM, FACFAS [facility]     [address] [place], [place] [address]                Office [phone]  Fax [phone]

## 2024-07-20 ENCOUNTER — Other Ambulatory Visit (HOSPITAL_COMMUNITY): Payer: Self-pay

## 2024-07-24 ENCOUNTER — Other Ambulatory Visit: Payer: Self-pay

## 2024-07-24 ENCOUNTER — Other Ambulatory Visit (HOSPITAL_COMMUNITY): Payer: Self-pay

## 2024-07-24 ENCOUNTER — Encounter (HOSPITAL_COMMUNITY): Payer: Self-pay

## 2024-07-24 MED ORDER — INSULIN LISPRO PROT & LISPRO (75-25 MIX) 100 UNIT/ML KWIKPEN
60.0000 [IU] | PEN_INJECTOR | Freq: Three times a day (TID) | SUBCUTANEOUS | 5 refills | Status: AC
  Filled 2024-07-24 (×2): qty 60, 34d supply, fill #0
  Filled ????-??-??: fill #0

## 2024-08-02 ENCOUNTER — Ambulatory Visit: Admitting: Podiatry

## 2024-08-11 ENCOUNTER — Other Ambulatory Visit: Payer: Self-pay | Admitting: Podiatry

## 2024-09-05 ENCOUNTER — Other Ambulatory Visit (HOSPITAL_COMMUNITY): Payer: Self-pay

## 2024-09-05 MED ORDER — HUMALOG MIX 50/50 KWIKPEN (50-50) 100 UNIT/ML ~~LOC~~ SUPN
60.0000 [IU] | PEN_INJECTOR | Freq: Three times a day (TID) | SUBCUTANEOUS | 5 refills | Status: AC
  Filled 2024-09-05: qty 162, 90d supply, fill #0

## 2024-09-06 ENCOUNTER — Other Ambulatory Visit (HOSPITAL_COMMUNITY): Payer: Self-pay

## 2024-09-06 ENCOUNTER — Encounter (HOSPITAL_COMMUNITY): Payer: Self-pay

## 2024-09-07 ENCOUNTER — Encounter: Payer: Self-pay | Admitting: Hematology

## 2024-09-07 ENCOUNTER — Other Ambulatory Visit (HOSPITAL_COMMUNITY): Payer: Self-pay

## 2024-09-11 ENCOUNTER — Other Ambulatory Visit (HOSPITAL_COMMUNITY): Payer: Self-pay

## 2024-09-17 ENCOUNTER — Other Ambulatory Visit (HOSPITAL_COMMUNITY): Payer: Self-pay

## 2024-09-20 ENCOUNTER — Encounter: Payer: Self-pay | Admitting: Podiatry

## 2024-09-21 ENCOUNTER — Ambulatory Visit: Admitting: Podiatry

## 2024-09-21 ENCOUNTER — Encounter: Payer: Self-pay | Admitting: Podiatry

## 2024-09-21 DIAGNOSIS — B351 Tinea unguium: Secondary | ICD-10-CM | POA: Diagnosis not present

## 2024-09-21 DIAGNOSIS — M722 Plantar fascial fibromatosis: Secondary | ICD-10-CM | POA: Diagnosis not present

## 2024-09-21 MED ORDER — CELECOXIB 100 MG PO CAPS: 100.0000 mg | ORAL_CAPSULE | Freq: Two times a day (BID) | ORAL | 1 refills | Status: AC

## 2024-09-21 MED ORDER — TRIAMCINOLONE ACETONIDE 10 MG/ML IJ SUSP
5.0000 mg | Freq: Once | INTRAMUSCULAR | Status: AC
  Administered 2024-09-21: 5 mg via INTRAMUSCULAR

## 2024-09-21 MED ORDER — CICLOPIROX 8 % EX SOLN: Freq: Every day | CUTANEOUS | 2 refills | Status: AC

## 2024-09-21 NOTE — Progress Notes (Signed)
 Subjective: Chief Complaint  Patient presents with   Foot Pain    RT FOOT HEEL PAIN. 10 Pain. Meloxicam  and stretches previously given. 0 improvements. Brace felt like it was cutting off circulation. IDDM A71C 28.74.   53 year old female presents the office with concerns of ongoing recurrent right heel pain.  She was last seen in July for this issue and she had an injection which was helpful but the pain is come back.  She does not recall any recent injuries or changes otherwise.  She stands on her feet at work which aggravate her symptoms as well.  She also has questions about toenail fungus.  Nails are getting more thick and discolored.  They do cause discomfort at times as they rub inside shoes but no swelling, redness or any drainage.  No recent treatment.  No other concerns.  Objective: AAO x3, NAD DP/PT pulses palpable bilaterally, CRT less than 3 seconds Nails appear to be hypertrophic, dystrophic with yellow, brown discoloration.  There is no edema, erythema or signs of infection.  No open lesions. Tenderness to palpation along the plantar medial tubercle of the calcaneus at the insertion of plantar fascia on the right foot. There is no pain along the course of the plantar fascia within the arch of the foot. Plantar fascia appears to be intact. There is no pain with lateral compression of the calcaneus or pain with vibratory sensation. There is no pain along the course or insertion of the achilles tendon. No other areas of tenderness to bilateral lower extremities. Negative tinel sign.  No pain with calf compression, swelling, warmth, erythema  Assessment: 53 year old female with right heel pain, plantar fasciitis; onychomycosis  Plan: Onychomycosis - We discussed with the oral, topical as well as alternative treatments for nail fungus.  Today as a courtesy I debrided the nails of any complications or bleeding.  I reviewed her blood work that she showed me from her MyChart and she has  increased LFTs or red hold off on oral medication and Lamisil  is contraindicated.  Due to this prescribed Penlac.  Discussed side effects and success rates as well as duration of use.  Right heel pain, plantar fasciitis - Patient was to proceed with a second steroid injection.  See procedure note below.  Continue stretching, icing on a regular basis as well as supportive shoe gear.  Prescribed Celebrex .  Her kidney function stable.  Hold off on other anti-inflammatories.  Procedure: Injection Tendon/Ligament Discussed alternatives, risks, complications and verbal consent was obtained.  Location: Right plantar fascia at the glabrous junction; medial approach. Skin Prep: Alcohol. Injectate: 0.5cc 0.5% marcaine  plain, 0.5 cc 2% lidocaine  plain and, 1 cc kenalog  10. Disposition: Patient tolerated procedure well. Injection site dressed with a band-aid.  Post-injection care was discussed and return precautions discussed.   Return if symptoms worsen or fail to improve.  Donnice JONELLE Fees DPM

## 2024-09-21 NOTE — Patient Instructions (Addendum)
 Plantar Fasciitis (Heel Spur Syndrome) with Rehab The plantar fascia is a fibrous, ligament-like, soft-tissue structure that spans the bottom of the foot. Plantar fasciitis is a condition that causes pain in the foot due to inflammation of the tissue. SYMPTOMS  Pain and tenderness on the underneath side of the foot. Pain that worsens with standing or walking. CAUSES  Plantar fasciitis is caused by irritation and injury to the plantar fascia on the underneath side of the foot. Common mechanisms of injury include: Direct trauma to bottom of the foot. Damage to a small nerve that runs under the foot where the main fascia attaches to the heel bone. Stress placed on the plantar fascia due to bone spurs. RISK INCREASES WITH:  Activities that place stress on the plantar fascia (running, jumping, pivoting, or cutting). Poor strength and flexibility. Improperly fitted shoes. Tight calf muscles. Flat feet. Failure to warm-up properly before activity. Obesity. PREVENTION Warm up and stretch properly before activity. Allow for adequate recovery between workouts. Maintain physical fitness: Strength, flexibility, and endurance. Cardiovascular fitness. Maintain a health body weight. Avoid stress on the plantar fascia. Wear properly fitted shoes, including arch supports for individuals who have flat feet.  PROGNOSIS  If treated properly, then the symptoms of plantar fasciitis usually resolve without surgery. However, occasionally surgery is necessary.  RELATED COMPLICATIONS  Recurrent symptoms that may result in a chronic condition. Problems of the lower back that are caused by compensating for the injury, such as limping. Pain or weakness of the foot during push-off following surgery. Chronic inflammation, scarring, and partial or complete fascia tear, occurring more often from repeated injections.  TREATMENT  Treatment initially involves the use of ice and medication to help reduce pain and  inflammation. The use of strengthening and stretching exercises may help reduce pain with activity, especially stretches of the Achilles tendon. These exercises may be performed at home or with a therapist. Your caregiver may recommend that you use heel cups of arch supports to help reduce stress on the plantar fascia. Occasionally, corticosteroid injections are given to reduce inflammation. If symptoms persist for greater than 6 months despite non-surgical (conservative), then surgery may be recommended.   MEDICATION  If pain medication is necessary, then nonsteroidal anti-inflammatory medications, such as aspirin and ibuprofen, or other minor pain relievers, such as acetaminophen, are often recommended. Do not take pain medication within 7 days before surgery. Prescription pain relievers may be given if deemed necessary by your caregiver. Use only as directed and only as much as you need. Corticosteroid injections may be given by your caregiver. These injections should be reserved for the most serious cases, because they may only be given a certain number of times.  HEAT AND COLD Cold treatment (icing) relieves pain and reduces inflammation. Cold treatment should be applied for 10 to 15 minutes every 2 to 3 hours for inflammation and pain and immediately after any activity that aggravates your symptoms. Use ice packs or massage the area with a piece of ice (ice massage). Heat treatment may be used prior to performing the stretching and strengthening activities prescribed by your caregiver, physical therapist, or athletic trainer. Use a heat pack or soak the injury in warm water.  SEEK IMMEDIATE MEDICAL CARE IF: Treatment seems to offer no benefit, or the condition worsens. Any medications produce adverse side effects.  EXERCISES- RANGE OF MOTION (ROM) AND STRETCHING EXERCISES - Plantar Fasciitis (Heel Spur Syndrome) These exercises may help you when beginning to rehabilitate your injury. Your  symptoms may resolve with or without further involvement from your physician, physical therapist or athletic trainer. While completing these exercises, remember:  Restoring tissue flexibility helps normal motion to return to the joints. This allows healthier, less painful movement and activity. An effective stretch should be held for at least 30 seconds. A stretch should never be painful. You should only feel a gentle lengthening or release in the stretched tissue.  RANGE OF MOTION - Toe Extension, Flexion Sit with your right / left leg crossed over your opposite knee. Grasp your toes and gently pull them back toward the top of your foot. You should feel a stretch on the bottom of your toes and/or foot. Hold this stretch for 10 seconds. Now, gently pull your toes toward the bottom of your foot. You should feel a stretch on the top of your toes and or foot. Hold this stretch for 10 seconds. Repeat  times. Complete this stretch 3 times per day.   RANGE OF MOTION - Ankle Dorsiflexion, Active Assisted Remove shoes and sit on a chair that is preferably not on a carpeted surface. Place right / left foot under knee. Extend your opposite leg for support. Keeping your heel down, slide your right / left foot back toward the chair until you feel a stretch at your ankle or calf. If you do not feel a stretch, slide your bottom forward to the edge of the chair, while still keeping your heel down. Hold this stretch for 10 seconds. Repeat 3 times. Complete this stretch 2 times per day.   STRETCH  Gastroc, Standing Place hands on wall. Extend right / left leg, keeping the front knee somewhat bent. Slightly point your toes inward on your back foot. Keeping your right / left heel on the floor and your knee straight, shift your weight toward the wall, not allowing your back to arch. You should feel a gentle stretch in the right / left calf. Hold this position for 10 seconds. Repeat 3 times. Complete this  stretch 2 times per day.  STRETCH  Soleus, Standing Place hands on wall. Extend right / left leg, keeping the other knee somewhat bent. Slightly point your toes inward on your back foot. Keep your right / left heel on the floor, bend your back knee, and slightly shift your weight over the back leg so that you feel a gentle stretch deep in your back calf. Hold this position for 10 seconds. Repeat 3 times. Complete this stretch 2 times per day.  STRETCH  Gastrocsoleus, Standing  Note: This exercise can place a lot of stress on your foot and ankle. Please complete this exercise only if specifically instructed by your caregiver.  Place the ball of your right / left foot on a step, keeping your other foot firmly on the same step. Hold on to the wall or a rail for balance. Slowly lift your other foot, allowing your body weight to press your heel down over the edge of the step. You should feel a stretch in your right / left calf. Hold this position for 10 seconds. Repeat this exercise with a slight bend in your right / left knee. Repeat 3 times. Complete this stretch 2 times per day.   STRENGTHENING EXERCISES - Plantar Fasciitis (Heel Spur Syndrome)  These exercises may help you when beginning to rehabilitate your injury. They may resolve your symptoms with or without further involvement from your physician, physical therapist or athletic trainer. While completing these exercises, remember:  Muscles can  gain both the endurance and the strength needed for everyday activities through controlled exercises. Complete these exercises as instructed by your physician, physical therapist or athletic trainer. Progress the resistance and repetitions only as guided.  STRENGTH - Towel Curls Sit in a chair positioned on a non-carpeted surface. Place your foot on a towel, keeping your heel on the floor. Pull the towel toward your heel by only curling your toes. Keep your heel on the floor. Repeat 3 times.  Complete this exercise 2 times per day.  STRENGTH - Ankle Inversion Secure one end of a rubber exercise band/tubing to a fixed object (table, pole). Loop the other end around your foot just before your toes. Place your fists between your knees. This will focus your strengthening at your ankle. Slowly, pull your big toe up and in, making sure the band/tubing is positioned to resist the entire motion. Hold this position for 10 seconds. Have your muscles resist the band/tubing as it slowly pulls your foot back to the starting position. Repeat 3 times. Complete this exercises 2 times per day.  Document Released: 11/29/2005 Document Revised: 02/21/2012 Document Reviewed: 03/13/2009 [facility] [employer] Patient Information 2014 [place], [place].  While at your visit today you received a steroid injection in your foot or ankle to help with your pain. Along with having the steroid medication there is some "numbing" medication in the shot that you received. Due to this you may notice some numbness to the area for the next couple of hours.   I would recommend limiting activity for the next few days to help the steroid injection take affect.    The actually benefit from the steroid injection may take up to 2-7 days to see a difference. You may actually experience a small (as in 10%) INCREASE in pain in the first 24 hours---that is common. It would be best if you can ice the area today and take anti-inflammatory medications (such as Ibuprofen, Motrin, or Aleve) if you are able to take these medications. If you were prescribed another medication to help with the pain go ahead and start that medication today    Things to watch out for that you should contact us or a health care provider urgently would include: 1. Unusual (as in more than 10%) increase in pain 2. New fever > 101.5 3. New swelling or redness of the injected area.  4. Streaking of red lines around the area injected.  If you have any questions or concerns  about this, please give our office a call at [phone].

## 2024-10-04 ENCOUNTER — Encounter: Payer: Self-pay | Admitting: Podiatry

## 2024-10-05 ENCOUNTER — Other Ambulatory Visit: Payer: Self-pay | Admitting: Podiatry

## 2024-10-05 MED ORDER — MELOXICAM 15 MG PO TABS: 15.0000 mg | ORAL_TABLET | Freq: Every day | ORAL | 1 refills | Status: DC | PRN

## 2024-10-17 ENCOUNTER — Other Ambulatory Visit: Payer: Self-pay | Admitting: Podiatry

## 2024-10-17 MED ORDER — METHYLPREDNISOLONE 4 MG PO TBPK: ORAL_TABLET | ORAL | 0 refills | Status: AC

## 2024-12-05 ENCOUNTER — Other Ambulatory Visit: Payer: Self-pay | Admitting: Podiatry

## 2024-12-25 ENCOUNTER — Encounter (HOSPITAL_COMMUNITY): Payer: Self-pay

## 2024-12-25 ENCOUNTER — Encounter: Payer: Self-pay | Admitting: Hematology

## 2024-12-25 ENCOUNTER — Other Ambulatory Visit (HOSPITAL_COMMUNITY): Payer: Self-pay

## 2025-01-01 ENCOUNTER — Other Ambulatory Visit (HOSPITAL_COMMUNITY): Payer: Self-pay
